# Patient Record
Sex: Female | Born: 1995 | Race: Black or African American | Hispanic: No | Marital: Single | Smoking: Current every day smoker
Health system: Southern US, Community
[De-identification: ages and names within clinical notes are randomized; demographics above are authoritative.]

## PROBLEM LIST (undated history)

## (undated) ENCOUNTER — Inpatient Hospital Stay (HOSPITAL_COMMUNITY): Payer: Self-pay

## (undated) DIAGNOSIS — L03319 Cellulitis of trunk, unspecified: Secondary | ICD-10-CM

## (undated) DIAGNOSIS — F419 Anxiety disorder, unspecified: Secondary | ICD-10-CM

## (undated) DIAGNOSIS — F32A Depression, unspecified: Secondary | ICD-10-CM

## (undated) DIAGNOSIS — D649 Anemia, unspecified: Secondary | ICD-10-CM

## (undated) DIAGNOSIS — L02219 Cutaneous abscess of trunk, unspecified: Secondary | ICD-10-CM

## (undated) DIAGNOSIS — G43909 Migraine, unspecified, not intractable, without status migrainosus: Secondary | ICD-10-CM

## (undated) DIAGNOSIS — B977 Papillomavirus as the cause of diseases classified elsewhere: Secondary | ICD-10-CM

## (undated) DIAGNOSIS — D573 Sickle-cell trait: Secondary | ICD-10-CM

## (undated) HISTORY — DX: Morbid (severe) obesity due to excess calories: E66.01

## (undated) HISTORY — DX: Cutaneous abscess of trunk, unspecified: L03.319

## (undated) HISTORY — DX: Cutaneous abscess of trunk, unspecified: L02.219

---
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
## 2016-02-07 ENCOUNTER — Inpatient Hospital Stay (HOSPITAL_COMMUNITY)
Admission: AD | Admit: 2016-02-07 | Discharge: 2016-02-07 | Disposition: A | Discharge status: Home / Self Care | Payer: Medicaid Other | Source: Ambulatory Visit | Attending: Family Medicine | Admitting: Family Medicine

## 2016-02-07 ENCOUNTER — Encounter (HOSPITAL_COMMUNITY): Payer: Self-pay | Admitting: *Deleted

## 2016-02-07 DIAGNOSIS — Z8279 Family history of other congenital malformations, deformations and chromosomal abnormalities: Secondary | ICD-10-CM | POA: Insufficient documentation

## 2016-02-07 DIAGNOSIS — Z3A34 34 weeks gestation of pregnancy: Secondary | ICD-10-CM | POA: Diagnosis not present

## 2016-02-07 DIAGNOSIS — M545 Low back pain: Secondary | ICD-10-CM | POA: Insufficient documentation

## 2016-02-07 DIAGNOSIS — R109 Unspecified abdominal pain: Secondary | ICD-10-CM | POA: Diagnosis not present

## 2016-02-07 DIAGNOSIS — D573 Sickle-cell trait: Secondary | ICD-10-CM | POA: Insufficient documentation

## 2016-02-07 DIAGNOSIS — Z87891 Personal history of nicotine dependence: Secondary | ICD-10-CM | POA: Diagnosis not present

## 2016-02-07 DIAGNOSIS — O26893 Other specified pregnancy related conditions, third trimester: Secondary | ICD-10-CM | POA: Diagnosis not present

## 2016-02-07 DIAGNOSIS — M549 Dorsalgia, unspecified: Secondary | ICD-10-CM

## 2016-02-07 LAB — URINALYSIS, ROUTINE W REFLEX MICROSCOPIC
Bilirubin Urine: NEGATIVE
GLUCOSE, UA: NEGATIVE mg/dL
Hgb urine dipstick: NEGATIVE
Ketones, ur: 15 mg/dL — AB
NITRITE: NEGATIVE
PH: 6 (ref 5.0–8.0)
PROTEIN: NEGATIVE mg/dL
SPECIFIC GRAVITY, URINE: 1.02 (ref 1.005–1.030)

## 2016-02-07 LAB — URINE MICROSCOPIC-ADD ON

## 2016-02-07 NOTE — Discharge Instructions (Signed)
Back Pain in Pregnancy °Back pain during pregnancy is common. It happens in about half of all pregnancies. It is important for you and your baby that you remain active during your pregnancy. If you feel that back pain is not allowing you to remain active or sleep well, it is time to see your caregiver. Back pain may be caused by several factors related to changes during your pregnancy. Fortunately, unless you had trouble with your back before your pregnancy, the pain is likely to get better after you deliver. °Low back pain usually occurs between the fifth and seventh months of pregnancy. It can, however, happen in the first couple months. Factors that increase the risk of back problems include:  °· Previous back problems. °· Injury to your back. °· Having twins or multiple births. °· A chronic cough. °· Stress. °· Job-related repetitive motions. °· Muscle or spinal disease in the back. °· Family history of back problems, ruptured (herniated) discs, or osteoporosis. °· Depression, anxiety, and panic attacks. °CAUSES  °· When you are pregnant, your body produces a hormone called relaxin. This hormone makes the ligaments connecting the low back and pubic bones more flexible. This flexibility allows the baby to be delivered more easily. When your ligaments are loose, your muscles need to work harder to support your back. Soreness in your back can come from tired muscles. Soreness can also come from back tissues that are irritated since they are receiving less support. °· As the baby grows, it puts pressure on the nerves and blood vessels in your pelvis. This can cause back pain. °· As the baby grows and gets heavier during pregnancy, the uterus pushes the stomach muscles forward and changes your center of gravity. This makes your back muscles work harder to maintain good posture. °SYMPTOMS  °Lumbar pain during pregnancy °Lumbar pain during pregnancy usually occurs at or above the waist in the center of the back. There  may be pain and numbness that radiates into your leg or foot. This is similar to low back pain experienced by non-pregnant women. It usually increases with sitting for long periods of time, standing, or repetitive lifting. Tenderness may also be present in the muscles along your upper back. °Posterior pelvic pain during pregnancy °Pain in the back of the pelvis is more common than lumbar pain in pregnancy. It is a deep pain felt in your side at the waistline, or across the tailbone (sacrum), or in both places. You may have pain on one or both sides. This pain can also go into the buttocks and backs of the upper thighs. Pubic and groin pain may also be present. The pain does not quickly resolve with rest, and morning stiffness may also be present. °Pelvic pain during pregnancy can be brought on by most activities. A high level of fitness before and during pregnancy may or may not prevent this problem. Labor pain is usually 1 to 2 minutes apart, lasts for about 1 minute, and involves a bearing down feeling or pressure in your pelvis. However, if you are at term with the pregnancy, constant low back pain can be the beginning of early labor, and you should be aware of this. °DIAGNOSIS  °X-rays of the back should not be done during the first 12 to 14 weeks of the pregnancy and only when absolutely necessary during the rest of the pregnancy. MRIs do not give off radiation and are safe during pregnancy. MRIs also should only be done when absolutely necessary. °HOME CARE INSTRUCTIONS °· Exercise   as directed by your caregiver. Exercise is the most effective way to prevent or manage back pain. If you have a back problem, it is especially important to avoid sports that require sudden body movements. Swimming and walking are great activities. °· Do not stand in one place for long periods of time. °· Do not wear high heels. °· Sit in chairs with good posture. Use a pillow on your lower back if necessary. Make sure your head  rests over your shoulders and is not hanging forward. °· Try sleeping on your side, preferably the left side, with a pillow or two between your legs. If you are sore after a night's rest, your bed may be too soft. Try placing a board between your mattress and box spring. °· Listen to your body when lifting. If you are experiencing pain, ask for help or try bending your knees more so you can use your leg muscles rather than your back muscles. Squat down when picking up something from the floor. Do not bend over. °· Eat a healthy diet. Try to gain weight within your caregiver's recommendations. °· Use heat or cold packs 3 to 4 times a day for 15 minutes to help with the pain. °· Only take over-the-counter or prescription medicines for pain, discomfort, or fever as directed by your caregiver. °Sudden (acute) back pain °· Use bed rest for only the most extreme, acute episodes of back pain. Prolonged bed rest over 48 hours will aggravate your condition. °· Ice is very effective for acute conditions. °¨ Put ice in a plastic bag. °¨ Place a towel between your skin and the bag. °¨ Leave the ice on for 10 to 20 minutes every 2 hours, or as needed. °· Using heat packs for 30 minutes prior to activities is also helpful. °Continued back pain °See your caregiver if you have continued problems. Your caregiver can help or refer you for appropriate physical therapy. With conditioning, most back problems can be avoided. Sometimes, a more serious issue may be the cause of back pain. You should be seen right away if new problems seem to be developing. Your caregiver may recommend: °· A maternity girdle. °· An elastic sling. °· A back brace. °· A massage therapist or acupuncture. °SEEK MEDICAL CARE IF:  °· You are not able to do most of your daily activities, even when taking the pain medicine you were given. °· You need a referral to a physical therapist or chiropractor. °· You want to try acupuncture. °SEEK IMMEDIATE MEDICAL CARE  IF: °· You develop numbness, tingling, weakness, or problems with the use of your arms or legs. °· You develop severe back pain that is no longer relieved with medicines. °· You have a sudden change in bowel or bladder control. °· You have increasing pain in other areas of the body. °· You develop shortness of breath, dizziness, or fainting. °· You develop nausea, vomiting, or sweating. °· You have back pain which is similar to labor pains. °· You have back pain along with your water breaking or vaginal bleeding. °· You have back pain or numbness that travels down your leg. °· Your back pain developed after you fell. °· You develop pain on one side of your back. You may have a kidney stone. °· You see blood in your urine. You may have a bladder infection or kidney stone. °· You have back pain with blisters. You may have shingles. °Back pain is fairly common during pregnancy but should not be accepted as just part of   the process. Back pain should always be treated as soon as possible. This will make your pregnancy as pleasant as possible.   This information is not intended to replace advice given to you by your health care provider. Make sure you discuss any questions you have with your health care provider.   Document Released: 12/13/2005 Document Revised: 11/27/2011 Document Reviewed: 01/24/2011 Elsevier Interactive Patient Education 2016 [employer]   AM I IN LABOR? What is labor? Labor is the work that your body does to birth your baby. Your uterus (the womb) contracts. Your cervix (the mouth of the uterus) opens. You will push your baby out into the world.  What do contractions (labor pains) feel like? When they first start, contractions usually feel like cramps during your period. Sometimes you feel pain in your back. Most often, contractions feel like muscles pulling painfully in your lower belly. At first, the contractions will probably be 15 to 20 minutes apart. They will not feel too  painful. As labor goes on, the contractions get stronger, closer together, and more painful.  How do I time the contractions? Time your contractions by counting the number of minutes from the start of one contraction to the start of the next contraction.  What should I do when the contractions start? If it is night and you can sleep, sleep. If it happens during the day, here are some things you can do to take care of yourself at home: ? Walk. If the pains you are having are real labor, walking will make the contractions come faster and harder. If the contractions are not going to continue and be real labor, walking will make the contractions slow down. ? Take a shower or bath. This will help you relax. ? Eat. Labor is a big event. It takes a lot of energy. ? Drink water. Not drinking enough water can cause false labor (contractions that hurt but do not open your cervix). If this is true labor, drinking water will help you have strength to get through your labor. ? Take a nap. Get all the rest you can. ? Get a massage. If your labor is in your back, a strong massage on your lower back may feel very good. Getting a foot massage is always good. ? Dont panic. You can do this. Your body was made for this. You are strong!  When should I go to the hospital or call my health care provider? ? Your contractions have been 5 minutes apart or less for at least 1 hour. ? If several contractions are so painful you cannot walk or talk during one. ? Your bag of waters breaks. (You may have a big gush of water or just water that runs down your legs when you walk.)  Are there other reasons to call my health care provider? Yes, you should call your health care provider or go to the hospital if you start to bleed like you are having a period-- blood that soaks your underwear or runs down your legs, if you have sudden severe pain, if your baby has not moved for several hours, or if you are leaking green fluid. The  rule is as follows: If you are very concerned about something, call.

## 2016-02-07 NOTE — MAU Note (Signed)
Patient presents at 3434 weeks gestation with c/o abdominal pain X 2 weeks. Fetus active. Denies bleeding or discharge.
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
## 2016-02-20 ENCOUNTER — Inpatient Hospital Stay (HOSPITAL_COMMUNITY)
Admission: AD | Admit: 2016-02-20 | Discharge: 2016-02-20 | Disposition: A | Discharge status: Home / Self Care | Payer: Medicaid Other | Source: Ambulatory Visit | Attending: Obstetrics & Gynecology | Admitting: Obstetrics & Gynecology

## 2016-02-20 ENCOUNTER — Encounter (HOSPITAL_COMMUNITY): Payer: Self-pay

## 2016-02-20 DIAGNOSIS — Z3493 Encounter for supervision of normal pregnancy, unspecified, third trimester: Secondary | ICD-10-CM | POA: Diagnosis present

## 2016-02-20 LAB — URINE MICROSCOPIC-ADD ON

## 2016-02-20 LAB — URINALYSIS, ROUTINE W REFLEX MICROSCOPIC
Bilirubin Urine: NEGATIVE
Glucose, UA: NEGATIVE mg/dL
HGB URINE DIPSTICK: NEGATIVE
Ketones, ur: NEGATIVE mg/dL
NITRITE: NEGATIVE
PROTEIN: 30 mg/dL — AB
SPECIFIC GRAVITY, URINE: 1.02 (ref 1.005–1.030)
pH: 6.5 (ref 5.0–8.0)

## 2016-02-20 NOTE — MAU Note (Signed)
Notified [facility] [place]. Lawson CNM patient presents with stomach and hip pain, uterine irritability noted, no contractions, fhr reactive, cervix closed, UA results given, discharge order received.

## 2016-02-20 NOTE — Discharge Instructions (Signed)
Vaginal Delivery °During delivery, your health care provider will help you give birth to your baby. During a vaginal delivery, you will work to push the baby out of your vagina. However, before you can push your baby out, a few things need to happen. The opening of your uterus (cervix) has to soften, thin out, and open up (dilate) all the way to 10 cm. Also, your baby has to move down from the uterus into your vagina.  °SIGNS OF LABOR  °Your health care provider will first need to make sure you are in labor. Signs of labor include:  °· Passing what is called the mucous plug before labor begins. This is a small amount of blood-stained mucus. °· Having regular, painful uterine contractions.   °· The time between contractions gets shorter.   °· The discomfort and pain gradually get more intense. °· Contraction pains get worse when walking and do not go away when resting.   °· Your cervix becomes thinner (effacement) and dilates. °BEFORE THE DELIVERY °Once you are in labor and admitted into the hospital or care center, your health care provider may do the following:  °· Perform a complete physical exam. °· Review any complications related to pregnancy or labor.  °· Check your blood pressure, pulse, temperature, and heart rate (vital signs).   °· Determine if, and when, the rupture of amniotic membranes occurred. °· Do a vaginal exam (using a sterile glove and lubricant) to determine:   °¨ The position (presentation) of the baby. Is the baby's head presenting first (vertex) in the birth canal (vagina), or are the feet or buttocks first (breech)?   °¨ The level (station) of the baby's head within the birth canal.   °¨ The effacement and dilatation of the cervix.   °· An electronic fetal monitor is usually placed on your abdomen when you first arrive. This is used to monitor your contractions and the baby's heart rate. °¨ When the monitor is on your abdomen (external fetal monitor), it can only pick up the frequency and  length of your contractions. It cannot tell the strength of your contractions. °¨ If it becomes necessary for your health care provider to know exactly how strong your contractions are or to see exactly what the baby's heart rate is doing, an internal monitor may be inserted into your vagina and uterus. Your health care provider will discuss the benefits and risks of using an internal monitor and obtain your permission before inserting the device. °¨ Continuous fetal monitoring may be needed if you have an epidural, are receiving certain medicines (such as oxytocin), or have pregnancy or labor complications. °· An IV access tube may be placed into a vein in your arm to deliver fluids and medicines if necessary. °THREE STAGES OF LABOR AND DELIVERY °Normal labor and delivery is divided into three stages. °First Stage °This stage starts when you begin to contract regularly and your cervix begins to efface and dilate. It ends when your cervix is completely open (fully dilated). The first stage is the longest stage of labor and can last from 3 hours to 15 hours.  °Several methods are available to help with labor pain. You and your health care provider will decide which option is best for you. Options include:  °· Opioid medicines. These are strong pain medicines that you can get through your IV tube or as a shot into your muscle. These medicines lessen pain but do not make it go away completely.  °· Epidural. A medicine is given through a thin tube that   is inserted in your back. The medicine numbs the lower part of your body and prevents any pain in that area. °· Paracervical pain medicine. This is an injection of an anesthetic on each side of your cervix.   °· You may request natural childbirth, which does not involve the use of pain medicines or an epidural during labor and delivery. Instead, you will use other things, such as breathing exercises, to help cope with the pain. °Second Stage °The second stage of labor  begins when your cervix is fully dilated at 10 cm. It continues until you push your baby down through the birth canal and the baby is born. This stage can take only minutes or several hours. °· The location of your baby's head as it moves through the birth canal is reported as a number called a station. If the baby's head has not started its descent, the station is described as being at minus 3 (-3). When your baby's head is at the zero station, it is at the middle of the birth canal and is engaged in the pelvis. The station of your baby helps indicate the progress of the second stage of labor. °· When your baby is born, your health care provider may hold the baby with his or her head lowered to prevent amniotic fluid, mucus, and blood from getting into the baby's lungs. The baby's mouth and nose may be suctioned with a small bulb syringe to remove any additional fluid. °· Your health care provider may then place the baby on your stomach. It is important to keep the baby from getting cold. To do this, the health care provider will dry the baby off, place the baby directly on your skin (with no blankets between you and the baby), and cover the baby with warm, dry blankets.   °· The umbilical cord is cut. °Third Stage °During the third stage of labor, your health care provider will deliver the placenta (afterbirth) and make sure your bleeding is under control. The delivery of the placenta usually takes about 5 minutes but can take up to 30 minutes. After the placenta is delivered, a medicine may be given either by IV or injection to help contract the uterus and control bleeding. If you are planning to breastfeed, you can try to do so now. °After you deliver the placenta, your uterus should contract and get very firm. If your uterus does not remain firm, your health care provider will massage it. This is important because the contraction of the uterus helps cut off bleeding at the site where the placenta was attached  to your uterus. If your uterus does not contract properly and stay firm, you may continue to bleed heavily. If there is a lot of bleeding, medicines may be given to contract the uterus and stop the bleeding.  °  °This information is not intended to replace advice given to you by your health care provider. Make sure you discuss any questions you have with your health care provider. °  °Document Released: 06/13/2008 Document Revised: 09/25/2014 Document Reviewed: 05/01/2012 °[employer] Interactive Patient Education ©2016 [employer] ° °

## 2016-02-20 NOTE — MAU Note (Signed)
Patient here for abdominal pain and hip pain, no vaginal bleeding, positive fm

## 2016-02-28 ENCOUNTER — Encounter (HOSPITAL_COMMUNITY): Payer: Self-pay | Admitting: *Deleted

## 2016-02-28 ENCOUNTER — Inpatient Hospital Stay (HOSPITAL_COMMUNITY)
Admission: AD | Admit: 2016-02-28 | Discharge: 2016-02-28 | Disposition: A | Discharge status: Home / Self Care | Payer: Medicaid Other | Source: Ambulatory Visit | Attending: Obstetrics and Gynecology | Admitting: Obstetrics and Gynecology

## 2016-02-28 DIAGNOSIS — O26893 Other specified pregnancy related conditions, third trimester: Secondary | ICD-10-CM | POA: Insufficient documentation

## 2016-02-28 DIAGNOSIS — D573 Sickle-cell trait: Secondary | ICD-10-CM | POA: Diagnosis not present

## 2016-02-28 DIAGNOSIS — Z87891 Personal history of nicotine dependence: Secondary | ICD-10-CM | POA: Diagnosis not present

## 2016-02-28 DIAGNOSIS — O9989 Other specified diseases and conditions complicating pregnancy, childbirth and the puerperium: Secondary | ICD-10-CM

## 2016-02-28 DIAGNOSIS — Z3A37 37 weeks gestation of pregnancy: Secondary | ICD-10-CM | POA: Insufficient documentation

## 2016-02-28 DIAGNOSIS — R109 Unspecified abdominal pain: Secondary | ICD-10-CM | POA: Diagnosis not present

## 2016-02-28 DIAGNOSIS — N898 Other specified noninflammatory disorders of vagina: Secondary | ICD-10-CM | POA: Diagnosis present

## 2016-02-28 LAB — WET PREP, GENITAL
Clue Cells Wet Prep HPF POC: NONE SEEN
Sperm: NONE SEEN
Trich, Wet Prep: NONE SEEN
Yeast Wet Prep HPF POC: NONE SEEN

## 2016-02-28 LAB — URINALYSIS, ROUTINE W REFLEX MICROSCOPIC
Bilirubin Urine: NEGATIVE
GLUCOSE, UA: NEGATIVE mg/dL
HGB URINE DIPSTICK: NEGATIVE
Ketones, ur: NEGATIVE mg/dL
Leukocytes, UA: NEGATIVE
Nitrite: NEGATIVE
PH: 6.5 (ref 5.0–8.0)
PROTEIN: NEGATIVE mg/dL
SPECIFIC GRAVITY, URINE: 1.02 (ref 1.005–1.030)

## 2016-02-28 LAB — POCT FERN TEST: POCT Fern Test: NEGATIVE

## 2016-02-28 NOTE — Progress Notes (Signed)
Notified of all lab tests results and negative fern. Will discharge home

## 2016-02-28 NOTE — Discharge Instructions (Signed)
Fetal Movement Counts °Patient Name: __________________________________________________ Patient Due Date: ____________________ °Performing a fetal movement count is highly recommended in high-risk pregnancies, but it is good for every pregnant woman to do. Your health care provider may ask you to start counting fetal movements at 28 weeks of the pregnancy. Fetal movements often increase: °· After eating a full meal. °· After physical activity. °· After eating or drinking something sweet or cold. °· At rest. °Pay attention to when you feel the baby is most active. This will help you notice a pattern of your baby's sleep and wake cycles and what factors contribute to an increase in fetal movement. It is important to perform a fetal movement count at the same time each day when your baby is normally most active.  °HOW TO COUNT FETAL MOVEMENTS °1. Find a quiet and comfortable area to sit or lie down on your left side. Lying on your left side provides the best blood and oxygen circulation to your baby. °2. Write down the day and time on a sheet of paper or in a journal. °3. Start counting kicks, flutters, swishes, rolls, or jabs in a 2-hour period. You should feel at least 10 movements within 2 hours. °4. If you do not feel 10 movements in 2 hours, wait 2-3 hours and count again. Look for a change in the pattern or not enough counts in 2 hours. °SEEK MEDICAL CARE IF: °· You feel less than 10 counts in 2 hours, tried twice. °· There is no movement in over an hour. °· The pattern is changing or taking longer each day to reach 10 counts in 2 hours. °· You feel the baby is not moving as he or she usually does. °Date: ____________ Movements: ____________ Start time: ____________ Finish time: ____________  °Date: ____________ Movements: ____________ Start time: ____________ Finish time: ____________ °Date: ____________ Movements: ____________ Start time: ____________ Finish time: ____________ °Date: ____________ Movements:  ____________ Start time: ____________ Finish time: ____________ °Date: ____________ Movements: ____________ Start time: ____________ Finish time: ____________ °Date: ____________ Movements: ____________ Start time: ____________ Finish time: ____________ °Date: ____________ Movements: ____________ Start time: ____________ Finish time: ____________ °Date: ____________ Movements: ____________ Start time: ____________ Finish time: ____________  °Date: ____________ Movements: ____________ Start time: ____________ Finish time: ____________ °Date: ____________ Movements: ____________ Start time: ____________ Finish time: ____________ °Date: ____________ Movements: ____________ Start time: ____________ Finish time: ____________ °Date: ____________ Movements: ____________ Start time: ____________ Finish time: ____________ °Date: ____________ Movements: ____________ Start time: ____________ Finish time: ____________ °Date: ____________ Movements: ____________ Start time: ____________ Finish time: ____________ °Date: ____________ Movements: ____________ Start time: ____________ Finish time: ____________  °Date: ____________ Movements: ____________ Start time: ____________ Finish time: ____________ °Date: ____________ Movements: ____________ Start time: ____________ Finish time: ____________ °Date: ____________ Movements: ____________ Start time: ____________ Finish time: ____________ °Date: ____________ Movements: ____________ Start time: ____________ Finish time: ____________ °Date: ____________ Movements: ____________ Start time: ____________ Finish time: ____________ °Date: ____________ Movements: ____________ Start time: ____________ Finish time: ____________ °Date: ____________ Movements: ____________ Start time: ____________ Finish time: ____________  °Date: ____________ Movements: ____________ Start time: ____________ Finish time: ____________ °Date: ____________ Movements: ____________ Start time: ____________ Finish  time: ____________ °Date: ____________ Movements: ____________ Start time: ____________ Finish time: ____________ °Date: ____________ Movements: ____________ Start time: ____________ Finish time: ____________ °Date: ____________ Movements: ____________ Start time: ____________ Finish time: ____________ °Date: ____________ Movements: ____________ Start time: ____________ Finish time: ____________ °Date: ____________ Movements: ____________ Start time: ____________ Finish time: ____________  °Date: ____________ Movements: ____________ Start time: ____________ Finish   time: ____________ °Date: ____________ Movements: ____________ Start time: ____________ Finish time: ____________ °Date: ____________ Movements: ____________ Start time: ____________ Finish time: ____________ °Date: ____________ Movements: ____________ Start time: ____________ Finish time: ____________ °Date: ____________ Movements: ____________ Start time: ____________ Finish time: ____________ °Date: ____________ Movements: ____________ Start time: ____________ Finish time: ____________ °Date: ____________ Movements: ____________ Start time: ____________ Finish time: ____________  °Date: ____________ Movements: ____________ Start time: ____________ Finish time: ____________ °Date: ____________ Movements: ____________ Start time: ____________ Finish time: ____________ °Date: ____________ Movements: ____________ Start time: ____________ Finish time: ____________ °Date: ____________ Movements: ____________ Start time: ____________ Finish time: ____________ °Date: ____________ Movements: ____________ Start time: ____________ Finish time: ____________ °Date: ____________ Movements: ____________ Start time: ____________ Finish time: ____________ °Date: ____________ Movements: ____________ Start time: ____________ Finish time: ____________  °Date: ____________ Movements: ____________ Start time: ____________ Finish time: ____________ °Date: ____________  Movements: ____________ Start time: ____________ Finish time: ____________ °Date: ____________ Movements: ____________ Start time: ____________ Finish time: ____________ °Date: ____________ Movements: ____________ Start time: ____________ Finish time: ____________ °Date: ____________ Movements: ____________ Start time: ____________ Finish time: ____________ °Date: ____________ Movements: ____________ Start time: ____________ Finish time: ____________ °Date: ____________ Movements: ____________ Start time: ____________ Finish time: ____________  °Date: ____________ Movements: ____________ Start time: ____________ Finish time: ____________ °Date: ____________ Movements: ____________ Start time: ____________ Finish time: ____________ °Date: ____________ Movements: ____________ Start time: ____________ Finish time: ____________ °Date: ____________ Movements: ____________ Start time: ____________ Finish time: ____________ °Date: ____________ Movements: ____________ Start time: ____________ Finish time: ____________ °Date: ____________ Movements: ____________ Start time: ____________ Finish time: ____________ °  °This information is not intended to replace advice given to you by your health care provider. Make sure you discuss any questions you have with your health care provider. °  °Document Released: 10/04/2006 Document Revised: 09/25/2014 Document Reviewed: 07/01/2012 °Elsevier Interactive Patient Education ©2016 [employer] °Third Trimester of Pregnancy °The third trimester is from week 29 through week 42, months 7 through 9. The third trimester is a time when the fetus is growing rapidly. At the end of the ninth month, the fetus is about 20 inches in length and weighs 6-10 pounds.  °BODY CHANGES °Your body goes through many changes during pregnancy. The changes vary from woman to woman.  °· Your weight will continue to increase. You can expect to gain 25-35 pounds (11-16 kg) by the end of the pregnancy. °· You may  begin to get stretch marks on your hips, abdomen, and breasts. °· You may urinate more often because the fetus is moving lower into your pelvis and pressing on your bladder. °· You may develop or continue to have heartburn as a result of your pregnancy. °· You may develop constipation because certain hormones are causing the muscles that push waste through your intestines to slow down. °· You may develop hemorrhoids or swollen, bulging veins (varicose veins). °· You may have pelvic pain because of the weight gain and pregnancy hormones relaxing your joints between the bones in your pelvis. Backaches may result from overexertion of the muscles supporting your posture. °· You may have changes in your hair. These can include thickening of your hair, rapid growth, and changes in texture. Some women also have hair loss during or after pregnancy, or hair that feels dry or thin. Your hair will most likely return to normal after your baby is born. °· Your breasts will continue to grow and be tender. A yellow discharge may leak from your breasts called colostrum. °· Your belly button may stick out. °·   You may feel short of breath because of your expanding uterus. °· You may notice the fetus "dropping," or moving lower in your abdomen. °· You may have a bloody mucus discharge. This usually occurs a few days to a week before labor begins. °· Your cervix becomes thin and soft (effaced) near your due date. °WHAT TO EXPECT AT YOUR PRENATAL EXAMS  °You will have prenatal exams every 2 weeks until week 36. Then, you will have weekly prenatal exams. During a routine prenatal visit: °5. You will be weighed to make sure you and the fetus are growing normally. °6. Your blood pressure is taken. °7. Your abdomen will be measured to track your baby's growth. °8. The fetal heartbeat will be listened to. °9. Any test results from the previous visit will be discussed. °10. You may have a cervical check near your due date to see if you have  effaced. °At around 36 weeks, your caregiver will check your cervix. At the same time, your caregiver will also perform a test on the secretions of the vaginal tissue. This test is to determine if a type of bacteria, Group B streptococcus, is present. Your caregiver will explain this further. °Your caregiver may ask you: °· What your birth plan is. °· How you are feeling. °· If you are feeling the baby move. °· If you have had any abnormal symptoms, such as leaking fluid, bleeding, severe headaches, or abdominal cramping. °· If you are using any tobacco products, including cigarettes, chewing tobacco, and electronic cigarettes. °· If you have any questions. °Other tests or screenings that may be performed during your third trimester include: °· Blood tests that check for low iron levels (anemia). °· Fetal testing to check the health, activity level, and growth of the fetus. Testing is done if you have certain medical conditions or if there are problems during the pregnancy. °· HIV (human immunodeficiency virus) testing. If you are at high risk, you may be screened for HIV during your third trimester of pregnancy. °FALSE LABOR °You may feel small, irregular contractions that eventually go away. These are called Braxton Hicks contractions, or false labor. Contractions may last for hours, days, or even weeks before true labor sets in. If contractions come at regular intervals, intensify, or become painful, it is best to be seen by your caregiver.  °SIGNS OF LABOR  °· Menstrual-like cramps. °· Contractions that are 5 minutes apart or less. °· Contractions that start on the top of the uterus and spread down to the lower abdomen and back. °· A sense of increased pelvic pressure or back pain. °· A watery or bloody mucus discharge that comes from the vagina. °If you have any of these signs before the 37th week of pregnancy, call your caregiver right away. You need to go to the hospital to get checked immediately. °HOME CARE  INSTRUCTIONS  °· Avoid all smoking, herbs, alcohol, and unprescribed drugs. These chemicals affect the formation and growth of the baby. °· Do not use any tobacco products, including cigarettes, chewing tobacco, and electronic cigarettes. If you need help quitting, ask your health care provider. You may receive counseling support and other resources to help you quit. °· Follow your caregiver's instructions regarding medicine use. There are medicines that are either safe or unsafe to take during pregnancy. °· Exercise only as directed by your caregiver. Experiencing uterine cramps is a good sign to stop exercising. °· Continue to eat regular, healthy meals. °· Wear a good support bra for   breast tenderness.  Do not use hot tubs, steam rooms, or saunas.  Wear your seat belt at all times when driving.  Avoid raw meat, uncooked cheese, cat litter boxes, and soil used by cats. These carry germs that can cause birth defects in the baby.  Take your prenatal vitamins.  Take 1500-2000 mg of calcium daily starting at the 20th week of pregnancy until you deliver your baby.  Try taking a stool softener (if your caregiver approves) if you develop constipation. Eat more high-fiber foods, such as fresh vegetables or fruit and whole grains. Drink plenty of fluids to keep your urine clear or pale yellow.  Take warm sitz baths to soothe any pain or discomfort caused by hemorrhoids. Use hemorrhoid cream if your caregiver approves.  If you develop varicose veins, wear support hose. Elevate your feet for 15 minutes, 3-4 times a day. Limit salt in your diet.  Avoid heavy lifting, wear low heal shoes, and practice good posture.  Rest a lot with your legs elevated if you have leg cramps or low back pain.  Visit your dentist if you have not gone during your pregnancy. Use a soft toothbrush to brush your teeth and be gentle when you floss.  A sexual relationship may be continued unless your caregiver directs you  otherwise.  Do not travel far distances unless it is absolutely necessary and only with the approval of your caregiver.  Take prenatal classes to understand, practice, and ask questions about the labor and delivery.  Make a trial run to the hospital.  Pack your hospital bag.  Prepare the baby's nursery.  Continue to go to all your prenatal visits as directed by your caregiver. SEEK MEDICAL CARE IF:  You are unsure if you are in labor or if your water has broken.  You have dizziness.  You have mild pelvic cramps, pelvic pressure, or nagging pain in your abdominal area.  You have persistent nausea, vomiting, or diarrhea.  You have a bad smelling vaginal discharge.  You have pain with urination. SEEK IMMEDIATE MEDICAL CARE IF:   You have a fever.  You are leaking fluid from your vagina.  You have spotting or bleeding from your vagina.  You have severe abdominal cramping or pain.  You have rapid weight loss or gain.  You have shortness of breath with chest pain.  You notice sudden or extreme swelling of your face, hands, ankles, feet, or legs.  You have not felt your baby move in over an hour.  You have severe headaches that do not go away with medicine.  You have vision changes.   This information is not intended to replace advice given to you by your health care provider. Make sure you discuss any questions you have with your health care provider.   Document Released: 08/29/2001 Document Revised: 09/25/2014 Document Reviewed: 11/05/2012 Elsevier Interactive Patient Education 2016 [employer] Inc[employer] Ball CorporationBraxton Hicks Contractions Contractions of the uterus can occur throughout pregnancy. Contractions are not always a sign that you are in labor.  WHAT ARE BRAXTON HICKS CONTRACTIONS?  Contractions that occur before labor are called Braxton Hicks contractions, or false labor. Toward the end of pregnancy (32-34 weeks), these contractions can develop more often and may become  more forceful. This is not true labor because these contractions do not result in opening (dilatation) and thinning of the cervix. They are sometimes difficult to tell apart from true labor because these contractions can be forceful and people have different pain tolerances. You should  not feel embarrassed if you go to the hospital with false labor. Sometimes, the only way to tell if you are in true labor is for your health care provider to look for changes in the cervix. If there are no prenatal problems or other health problems associated with the pregnancy, it is completely safe to be sent home with false labor and await the onset of true labor. HOW CAN YOU TELL THE DIFFERENCE BETWEEN TRUE AND FALSE LABOR? False Labor  The contractions of false labor are usually shorter and not as hard as those of true labor.   The contractions are usually irregular.   The contractions are often felt in the front of the lower abdomen and in the groin.   The contractions may go away when you walk around or change positions while lying down.   The contractions get weaker and are shorter lasting as time goes on.   The contractions do not usually become progressively stronger, regular, and closer together as with true labor.  True Labor 11. Contractions in true labor last 30-70 seconds, become very regular, usually become more intense, and increase in frequency.  12. The contractions do not go away with walking.  13. The discomfort is usually felt in the top of the uterus and spreads to the lower abdomen and low back.  14. True labor can be determined by your health care provider with an exam. This will show that the cervix is dilating and getting thinner.  WHAT TO REMEMBER  Keep up with your usual exercises and follow other instructions given by your health care provider.   Take medicines as directed by your health care provider.   Keep your regular prenatal appointments.   Eat and drink  lightly if you think you are going into labor.   If Braxton Hicks contractions are making you uncomfortable:   Change your position from lying down or resting to walking, or from walking to resting.   Sit and rest in a tub of warm water.   Drink 2-3 glasses of water. Dehydration may cause these contractions.   Do slow and deep breathing several times an hour.  WHEN SHOULD I SEEK IMMEDIATE MEDICAL CARE? Seek immediate medical care if:  Your contractions become stronger, more regular, and closer together.   You have fluid leaking or gushing from your vagina.   You have a fever.   You pass blood-tinged mucus.   You have vaginal bleeding.   You have continuous abdominal pain.   You have low back pain that you never had before.   You feel your baby's head pushing down and causing pelvic pressure.   Your baby is not moving as much as it used to.    This information is not intended to replace advice given to you by your health care provider. Make sure you discuss any questions you have with your health care provider.   Document Released: 09/04/2005 Document Revised: 09/09/2013 Document Reviewed: 06/16/2013 Elsevier Interactive Patient Education [employer]

## 2016-02-28 NOTE — MAU Note (Signed)
Pt presents complaining of pink discharge when she wipes and pain in her pelvis and vagina. Denies leaking of fluid. Reports good fetal movement.

## 2016-02-28 NOTE — MAU Provider Note (Signed)
Kelly HISTORY AND PHYSICAL  Chief Complaint:  Vaginal Discharge and Pelvic Pain   Kelly Hensley is a 20 y.o.  G1P0000 with IUP at [redacted]w[redacted]d presenting for Vaginal Discharge and Pelvic Pain Occasional contraction. Pink-tinged discharge today. No fever or dysuria. Positive fetal movement. Called by nurse to rule-out rupture. RN labor check.     Past Medical History  Diagnosis Date  . Morbid obesity (HCC)   . Cellulitis and abscess of trunk   . Sickle cell trait (HCC)   . Anemia   . HPV (human papilloma virus) infection   . Migraines     Past Surgical History  Procedure Laterality Date  . Abcess drainage Left 2014    Family History  Problem Relation Age of Onset  . Diabetes Mother   . Sickle cell anemia Father     Social History  Substance Use Topics  . Smoking status: Former Smoker -- 0.25 packs/day    Types: Cigarettes  . Smokeless tobacco: None  . Alcohol Use: No    No Known Allergies  Prescriptions prior to admission  Medication Sig Dispense Refill Last Dose  . ferrous sulfate 325 (65 FE) MG tablet Take 325 mg by mouth at bedtime.   02/27/2016 at Unknown time  . Prenatal Vit-Fe Fumarate-FA (PRENATAL MULTIVITAMIN) TABS tablet Take 1 tablet by mouth at bedtime.   02/27/2016 at Unknown time    Review of Systems - Negative except for what is mentioned in HPI.  Physical Exam  Blood pressure 133/78, pulse 101, temperature 98.3 F (36.8 C), temperature source Oral, resp. rate 18, last menstrual period 06/10/2015. GENERAL: Well-developed, well-nourished female in no acute distress.  LUNGS: Clear to auscultation bilaterally.  HEART: Regular rate and rhythm. ABDOMEN: Soft, nontender, nondistended, gravid.  EXTREMITIES: Nontender, no edema, 2+ distal pulses. Cervical Exam: closed/thick/high. SSE reveals no pooling, scant mucous discharge, no blood FHT: 140/mod/+a/-d Toco: irregular irritability   Labs: Results for orders placed or performed during the hospital encounter of  02/28/16 (from the past 24 hour(s))  Urinalysis, Routine w reflex microscopic (not at [facility])   Collection Time: 02/28/16  9:05 PM  Result Value Ref Range   Color, Urine YELLOW YELLOW   APPearance CLEAR CLEAR   Specific Gravity, Urine 1.020 1.005 - 1.030   pH 6.5 5.0 - 8.0   Glucose, UA NEGATIVE NEGATIVE mg/dL   Hgb urine dipstick NEGATIVE NEGATIVE   Bilirubin Urine NEGATIVE NEGATIVE   Ketones, ur NEGATIVE NEGATIVE mg/dL   Protein, ur NEGATIVE NEGATIVE mg/dL   Nitrite NEGATIVE NEGATIVE   Leukocytes, UA NEGATIVE NEGATIVE  Wet prep, genital   Collection Time: 02/28/16  9:13 PM  Result Value Ref Range   Yeast Wet Prep HPF POC NONE SEEN NONE SEEN   Trich, Wet Prep NONE SEEN NONE SEEN   Clue Cells Wet Prep HPF POC NONE SEEN NONE SEEN   WBC, Wet Prep HPF POC MODERATE (A) NONE SEEN   Sperm NONE SEEN   Fern Test   Collection Time: 02/28/16  9:43 PM  Result Value Ref Range   POCT Fern Test Negative = intact amniotic membranes     Imaging Studies:  No results found.  Assessment: Kelly Hensley is  20 y.o. G1P0000 at [redacted]w[redacted]d presents for labor check. Rn asked me to rule-out rupture. Not in labor, cervix closed, nst reactive. No bleeding seen on exam. Wet prep unremarkable, fern negative, pooling negative, urinalysis not suggestive of infection./  Plan: - f/u G/C - prom and labor return precautions -  clinic f/u this week as scheduled  Silvano Bilisoah B Naseer Hearn 6/12/20179:47 PM

## 2016-02-29 LAB — GC/CHLAMYDIA PROBE AMP (~~LOC~~) NOT AT ARMC
CHLAMYDIA, DNA PROBE: NEGATIVE
NEISSERIA GONORRHEA: NEGATIVE

## 2016-03-07 ENCOUNTER — Encounter (HOSPITAL_COMMUNITY): Payer: Self-pay

## 2016-03-07 ENCOUNTER — Inpatient Hospital Stay (HOSPITAL_COMMUNITY)
Admission: AD | Admit: 2016-03-07 | Discharge: 2016-03-07 | Disposition: A | Discharge status: Home / Self Care | Payer: Medicaid Other | Source: Ambulatory Visit | Attending: Family Medicine | Admitting: Family Medicine

## 2016-03-07 DIAGNOSIS — Z3493 Encounter for supervision of normal pregnancy, unspecified, third trimester: Secondary | ICD-10-CM | POA: Diagnosis not present

## 2016-03-07 NOTE — Discharge Instructions (Signed)
Braxton Hicks Contractions °Contractions of the uterus can occur throughout pregnancy. Contractions are not always a sign that you are in labor.  °WHAT ARE BRAXTON HICKS CONTRACTIONS?  °Contractions that occur before labor are called Braxton Hicks contractions, or false labor. Toward the end of pregnancy (32-34 weeks), these contractions can develop more often and may become more forceful. This is not true labor because these contractions do not result in opening (dilatation) and thinning of the cervix. They are sometimes difficult to tell apart from true labor because these contractions can be forceful and people have different pain tolerances. You should not feel embarrassed if you go to the hospital with false labor. Sometimes, the only way to tell if you are in true labor is for your health care provider to look for changes in the cervix. °If there are no prenatal problems or other health problems associated with the pregnancy, it is completely safe to be sent home with false labor and await the onset of true labor. °HOW CAN YOU TELL THE DIFFERENCE BETWEEN TRUE AND FALSE LABOR? °False Labor °· The contractions of false labor are usually shorter and not as hard as those of true labor.   °· The contractions are usually irregular.   °· The contractions are often felt in the front of the lower abdomen and in the groin.   °· The contractions may go away when you walk around or change positions while lying down.   °· The contractions get weaker and are shorter lasting as time goes on.   °· The contractions do not usually become progressively stronger, regular, and closer together as with true labor.   °True Labor °· Contractions in true labor last 30-70 seconds, become very regular, usually become more intense, and increase in frequency.   °· The contractions do not go away with walking.   °· The discomfort is usually felt in the top of the uterus and spreads to the lower abdomen and low back.   °· True labor can be  determined by your health care provider with an exam. This will show that the cervix is dilating and getting thinner.   °WHAT TO REMEMBER °· Keep up with your usual exercises and follow other instructions given by your health care provider.   °· Take medicines as directed by your health care provider.   °· Keep your regular prenatal appointments.   °· Eat and drink lightly if you think you are going into labor.   °· If Braxton Hicks contractions are making you uncomfortable:   °¨ Change your position from lying down or resting to walking, or from walking to resting.   °¨ Sit and rest in a tub of warm water.   °¨ Drink 2-3 glasses of water. Dehydration may cause these contractions.   °¨ Do slow and deep breathing several times an hour.   °WHEN SHOULD I SEEK IMMEDIATE MEDICAL CARE? °Seek immediate medical care if: °· Your contractions become stronger, more regular, and closer together.   °· You have fluid leaking or gushing from your vagina.   °· You have a fever.   °· You pass blood-tinged mucus.   °· You have vaginal bleeding.   °· You have continuous abdominal pain.   °· You have low back pain that you never had before.   °· You feel your baby's head pushing down and causing pelvic pressure.   °· Your baby is not moving as much as it used to.   °  °This information is not intended to replace advice given to you by your health care provider. Make sure you discuss any questions you have with your health care   provider. °  °Document Released: 09/04/2005 Document Revised: 09/09/2013 Document Reviewed: 06/16/2013 °[employer] Interactive Patient Education ©2016 [employer] °Fetal Movement Counts °Patient Name: __________________________________________________ Patient Due Date: ____________________ °Performing a fetal movement count is highly recommended in high-risk pregnancies, but it is good for every pregnant woman to do. Your health care provider may ask you to start counting fetal movements at 28 weeks of the  pregnancy. Fetal movements often increase: °· After eating a full meal. °· After physical activity. °· After eating or drinking something sweet or cold. °· At rest. °Pay attention to when you feel the baby is most active. This will help you notice a pattern of your baby's sleep and wake cycles and what factors contribute to an increase in fetal movement. It is important to perform a fetal movement count at the same time each day when your baby is normally most active.  °HOW TO COUNT FETAL MOVEMENTS °· Find a quiet and comfortable area to sit or lie down on your left side. Lying on your left side provides the best blood and oxygen circulation to your baby. °· Write down the day and time on a sheet of paper or in a journal. °· Start counting kicks, flutters, swishes, rolls, or jabs in a 2-hour period. You should feel at least 10 movements within 2 hours. °· If you do not feel 10 movements in 2 hours, wait 2-3 hours and count again. Look for a change in the pattern or not enough counts in 2 hours. °SEEK MEDICAL CARE IF: °· You feel less than 10 counts in 2 hours, tried twice. °· There is no movement in over an hour. °· The pattern is changing or taking longer each day to reach 10 counts in 2 hours. °· You feel the baby is not moving as he or she usually does. °Date: ____________ Movements: ____________ Start time: ____________ Finish time: ____________  °Date: ____________ Movements: ____________ Start time: ____________ Finish time: ____________ °Date: ____________ Movements: ____________ Start time: ____________ Finish time: ____________ °Date: ____________ Movements: ____________ Start time: ____________ Finish time: ____________ °Date: ____________ Movements: ____________ Start time: ____________ Finish time: ____________ °Date: ____________ Movements: ____________ Start time: ____________ Finish time: ____________ °Date: ____________ Movements: ____________ Start time: ____________ Finish time: ____________ °Date:  ____________ Movements: ____________ Start time: ____________ Finish time: ____________  °Date: ____________ Movements: ____________ Start time: ____________ Finish time: ____________ °Date: ____________ Movements: ____________ Start time: ____________ Finish time: ____________ °Date: ____________ Movements: ____________ Start time: ____________ Finish time: ____________ °Date: ____________ Movements: ____________ Start time: ____________ Finish time: ____________ °Date: ____________ Movements: ____________ Start time: ____________ Finish time: ____________ °Date: ____________ Movements: ____________ Start time: ____________ Finish time: ____________ °Date: ____________ Movements: ____________ Start time: ____________ Finish time: ____________  °Date: ____________ Movements: ____________ Start time: ____________ Finish time: ____________ °Date: ____________ Movements: ____________ Start time: ____________ Finish time: ____________ °Date: ____________ Movements: ____________ Start time: ____________ Finish time: ____________ °Date: ____________ Movements: ____________ Start time: ____________ Finish time: ____________ °Date: ____________ Movements: ____________ Start time: ____________ Finish time: ____________ °Date: ____________ Movements: ____________ Start time: ____________ Finish time: ____________ °Date: ____________ Movements: ____________ Start time: ____________ Finish time: ____________  °Date: ____________ Movements: ____________ Start time: ____________ Finish time: ____________ °Date: ____________ Movements: ____________ Start time: ____________ Finish time: ____________ °Date: ____________ Movements: ____________ Start time: ____________ Finish time: ____________ °Date: ____________ Movements: ____________ Start time: ____________ Finish time: ____________ °Date: ____________ Movements: ____________ Start time: ____________ Finish time: ____________ °Date: ____________ Movements: ____________ Start  time: ____________ Finish time:   ____________ °Date: ____________ Movements: ____________ Start time: ____________ Finish time: ____________  °Date: ____________ Movements: ____________ Start time: ____________ Finish time: ____________ °Date: ____________ Movements: ____________ Start time: ____________ Finish time: ____________ °Date: ____________ Movements: ____________ Start time: ____________ Finish time: ____________ °Date: ____________ Movements: ____________ Start time: ____________ Finish time: ____________ °Date: ____________ Movements: ____________ Start time: ____________ Finish time: ____________ °Date: ____________ Movements: ____________ Start time: ____________ Finish time: ____________ °Date: ____________ Movements: ____________ Start time: ____________ Finish time: ____________  °Date: ____________ Movements: ____________ Start time: ____________ Finish time: ____________ °Date: ____________ Movements: ____________ Start time: ____________ Finish time: ____________ °Date: ____________ Movements: ____________ Start time: ____________ Finish time: ____________ °Date: ____________ Movements: ____________ Start time: ____________ Finish time: ____________ °Date: ____________ Movements: ____________ Start time: ____________ Finish time: ____________ °Date: ____________ Movements: ____________ Start time: ____________ Finish time: ____________ °Date: ____________ Movements: ____________ Start time: ____________ Finish time: ____________  °Date: ____________ Movements: ____________ Start time: ____________ Finish time: ____________ °Date: ____________ Movements: ____________ Start time: ____________ Finish time: ____________ °Date: ____________ Movements: ____________ Start time: ____________ Finish time: ____________ °Date: ____________ Movements: ____________ Start time: ____________ Finish time: ____________ °Date: ____________ Movements: ____________ Start time: ____________ Finish time:  ____________ °Date: ____________ Movements: ____________ Start time: ____________ Finish time: ____________ °Date: ____________ Movements: ____________ Start time: ____________ Finish time: ____________  °Date: ____________ Movements: ____________ Start time: ____________ Finish time: ____________ °Date: ____________ Movements: ____________ Start time: ____________ Finish time: ____________ °Date: ____________ Movements: ____________ Start time: ____________ Finish time: ____________ °Date: ____________ Movements: ____________ Start time: ____________ Finish time: ____________ °Date: ____________ Movements: ____________ Start time: ____________ Finish time: ____________ °Date: ____________ Movements: ____________ Start time: ____________ Finish time: ____________ °  °This information is not intended to replace advice given to you by your health care provider. Make sure you discuss any questions you have with your health care provider. °  °Document Released: 10/04/2006 Document Revised: 09/25/2014 Document Reviewed: 07/01/2012 °Elsevier Interactive Patient Education ©2016 [employer] ° °

## 2016-03-07 NOTE — MAU Note (Signed)
Patient is here with c/o ctx. Patient denies any bleeding or LOF.

## 2016-03-07 NOTE — MAU Note (Signed)
Notified provider that patient is here for a labor eval. Patient is 1/20/-2 on cervical exam. No bleeding or LOF. Provider said to discharge patient.

## 2016-03-08 ENCOUNTER — Encounter (HOSPITAL_COMMUNITY): Payer: Self-pay

## 2016-03-08 ENCOUNTER — Inpatient Hospital Stay (HOSPITAL_COMMUNITY)
Admission: AD | Admit: 2016-03-08 | Discharge: 2016-03-08 | Disposition: A | Discharge status: Home / Self Care | Payer: Medicaid Other | Source: Ambulatory Visit | Attending: Obstetrics and Gynecology | Admitting: Obstetrics and Gynecology

## 2016-03-08 DIAGNOSIS — Z3493 Encounter for supervision of normal pregnancy, unspecified, third trimester: Secondary | ICD-10-CM | POA: Diagnosis present

## 2016-03-08 DIAGNOSIS — E860 Dehydration: Secondary | ICD-10-CM | POA: Diagnosis not present

## 2016-03-08 DIAGNOSIS — O9989 Other specified diseases and conditions complicating pregnancy, childbirth and the puerperium: Secondary | ICD-10-CM

## 2016-03-08 DIAGNOSIS — Z3A38 38 weeks gestation of pregnancy: Secondary | ICD-10-CM | POA: Insufficient documentation

## 2016-03-08 DIAGNOSIS — Z3689 Encounter for other specified antenatal screening: Secondary | ICD-10-CM

## 2016-03-08 LAB — URINALYSIS, ROUTINE W REFLEX MICROSCOPIC
Bilirubin Urine: NEGATIVE
Glucose, UA: NEGATIVE mg/dL
Hgb urine dipstick: NEGATIVE
Ketones, ur: 15 mg/dL — AB
Nitrite: NEGATIVE
PROTEIN: NEGATIVE mg/dL
SPECIFIC GRAVITY, URINE: 1.025 (ref 1.005–1.030)
pH: 6 (ref 5.0–8.0)

## 2016-03-08 LAB — URINE MICROSCOPIC-ADD ON: RBC / HPF: NONE SEEN RBC/hpf (ref 0–5)

## 2016-03-08 NOTE — Discharge Instructions (Signed)
Fetal Movement Counts Patient Name: __________________________________________________ Patient Due Date: ____________________ Performing a fetal movement count is highly recommended in high-risk pregnancies, but it is good for every pregnant woman to do. Your health care provider may ask you to start counting fetal movements at 28 weeks of the pregnancy. Fetal movements often increase:  After eating a full meal.  After physical activity.  After eating or drinking something sweet or cold.  At rest. Pay attention to when you feel the baby is most active. This will help you notice a pattern of your baby's sleep and wake cycles and what factors contribute to an increase in fetal movement. It is important to perform a fetal movement count at the same time each day when your baby is normally most active.  HOW TO COUNT FETAL MOVEMENTS 1. Find a quiet and comfortable area to sit or lie down on your left side. Lying on your left side provides the best blood and oxygen circulation to your baby. 2. Write down the day and time on a sheet of paper or in a journal. 3. Start counting kicks, flutters, swishes, rolls, or jabs in a 2-hour period. You should feel at least 10 movements within 2 hours. 4. If you do not feel 10 movements in 2 hours, wait 2-3 hours and count again. Look for a change in the pattern or not enough counts in 2 hours. SEEK MEDICAL CARE IF:  You feel less than 10 counts in 2 hours, tried twice.  There is no movement in over an hour.  The pattern is changing or taking longer each day to reach 10 counts in 2 hours.  You feel the baby is not moving as he or she usually does. Date: ____________ Movements: ____________ Start time: ____________ Finish time: ____________  Date: ____________ Movements: ____________ Start time: ____________ Finish time: ____________ Date: ____________ Movements: ____________ Start time: ____________ Finish time: ____________ Date: ____________ Movements:  ____________ Start time: ____________ Finish time: ____________ Date: ____________ Movements: ____________ Start time: ____________ Finish time: ____________ Date: ____________ Movements: ____________ Start time: ____________ Finish time: ____________ Date: ____________ Movements: ____________ Start time: ____________ Finish time: ____________ Date: ____________ Movements: ____________ Start time: ____________ Finish time: ____________  Date: ____________ Movements: ____________ Start time: ____________ Finish time: ____________ Date: ____________ Movements: ____________ Start time: ____________ Finish time: ____________ Date: ____________ Movements: ____________ Start time: ____________ Finish time: ____________ Date: ____________ Movements: ____________ Start time: ____________ Finish time: ____________ Date: ____________ Movements: ____________ Start time: ____________ Finish time: ____________ Date: ____________ Movements: ____________ Start time: ____________ Finish time: ____________ Date: ____________ Movements: ____________ Start time: ____________ Finish time: ____________  Date: ____________ Movements: ____________ Start time: ____________ Finish time: ____________ Date: ____________ Movements: ____________ Start time: ____________ Finish time: ____________ Date: ____________ Movements: ____________ Start time: ____________ Finish time: ____________ Date: ____________ Movements: ____________ Start time: ____________ Finish time: ____________ Date: ____________ Movements: ____________ Start time: ____________ Finish time: ____________ Date: ____________ Movements: ____________ Start time: ____________ Finish time: ____________ Date: ____________ Movements: ____________ Start time: ____________ Finish time: ____________  Date: ____________ Movements: ____________ Start time: ____________ Finish time: ____________ Date: ____________ Movements: ____________ Start time: ____________ Finish  time: ____________ Date: ____________ Movements: ____________ Start time: ____________ Finish time: ____________ Date: ____________ Movements: ____________ Start time: ____________ Finish time: ____________ Date: ____________ Movements: ____________ Start time: ____________ Finish time: ____________ Date: ____________ Movements: ____________ Start time: ____________ Finish time: ____________ Date: ____________ Movements: ____________ Start time: ____________ Finish time: ____________  Date: ____________ Movements: ____________ Start time: ____________ Finish   time: ____________ Date: ____________ Movements: ____________ Start time: ____________ Finish time: ____________ Date: ____________ Movements: ____________ Start time: ____________ Finish time: ____________ Date: ____________ Movements: ____________ Start time: ____________ Finish time: ____________ Date: ____________ Movements: ____________ Start time: ____________ Finish time: ____________ Date: ____________ Movements: ____________ Start time: ____________ Finish time: ____________ Date: ____________ Movements: ____________ Start time: ____________ Finish time: ____________  Date: ____________ Movements: ____________ Start time: ____________ Finish time: ____________ Date: ____________ Movements: ____________ Start time: ____________ Finish time: ____________ Date: ____________ Movements: ____________ Start time: ____________ Finish time: ____________ Date: ____________ Movements: ____________ Start time: ____________ Finish time: ____________ Date: ____________ Movements: ____________ Start time: ____________ Finish time: ____________ Date: ____________ Movements: ____________ Start time: ____________ Finish time: ____________ Date: ____________ Movements: ____________ Start time: ____________ Finish time: ____________  Date: ____________ Movements: ____________ Start time: ____________ Finish time: ____________ Date: ____________  Movements: ____________ Start time: ____________ Finish time: ____________ Date: ____________ Movements: ____________ Start time: ____________ Finish time: ____________ Date: ____________ Movements: ____________ Start time: ____________ Finish time: ____________ Date: ____________ Movements: ____________ Start time: ____________ Finish time: ____________ Date: ____________ Movements: ____________ Start time: ____________ Finish time: ____________ Date: ____________ Movements: ____________ Start time: ____________ Finish time: ____________  Date: ____________ Movements: ____________ Start time: ____________ Finish time: ____________ Date: ____________ Movements: ____________ Start time: ____________ Finish time: ____________ Date: ____________ Movements: ____________ Start time: ____________ Finish time: ____________ Date: ____________ Movements: ____________ Start time: ____________ Finish time: ____________ Date: ____________ Movements: ____________ Start time: ____________ Finish time: ____________ Date: ____________ Movements: ____________ Start time: ____________ Finish time: ____________   This information is not intended to replace advice given to you by your health care provider. Make sure you discuss any questions you have with your health care provider.   Document Released: 10/04/2006 Document Revised: 09/25/2014 Document Reviewed: 07/01/2012 Else[employer] Interactive Patient Education 2016 [employer] Braxton Hicks Contractions Contractions of the uterus can occur throughout pregnancy. Contractions are not always a sign that you are in labor.  WHAT ARE BRAXTON HICKS CONTRACTIONS?  Contractions that occur before labor are called Braxton Hicks contractions, or false labor. Toward the end of pregnancy (32-34 weeks), these contractions can develop more often and may become more forceful. This is not true labor because these contractions do not result in opening (dilatation) and thinning of  the cervix. They are sometimes difficult to tell apart from true labor because these contractions can be forceful and people have different pain tolerances. You should not feel embarrassed if you go to the hospital with false labor. Sometimes, the only way to tell if you are in true labor is for your health care provider to look for changes in the cervix. If there are no prenatal problems or other health problems associated with the pregnancy, it is completely safe to be sent home with false labor and await the onset of true labor. HOW CAN YOU TELL THE DIFFERENCE BETWEEN TRUE AND FALSE LABOR? False Labor  The contractions of false labor are usually shorter and not as hard as those of true labor.   The contractions are usually irregular.   The contractions are often felt in the front of the lower abdomen and in the groin.   The contractions may go away when you walk around or change positions while lying down.   The contractions get weaker and are shorter lasting as time goes on.   The contractions do not usually become progressively stronger, regular, and closer together as with true labor.  True Labor 5. Contractions in true   labor last 30-70 seconds, become very regular, usually become more intense, and increase in frequency.  6. The contractions do not go away with walking.  7. The discomfort is usually felt in the top of the uterus and spreads to the lower abdomen and low back.  8. True labor can be determined by your health care provider with an exam. This will show that the cervix is dilating and getting thinner.  WHAT TO REMEMBER  Keep up with your usual exercises and follow other instructions given by your health care provider.   Take medicines as directed by your health care provider.   Keep your regular prenatal appointments.   Eat and drink lightly if you think you are going into labor.   If Braxton Hicks contractions are making you uncomfortable:   Change  your position from lying down or resting to walking, or from walking to resting.   Sit and rest in a tub of warm water.   Drink 2-3 glasses of water. Dehydration may cause these contractions.   Do slow and deep breathing several times an hour.  WHEN SHOULD I SEEK IMMEDIATE MEDICAL CARE? Seek immediate medical care if:  Your contractions become stronger, more regular, and closer together.   You have fluid leaking or gushing from your vagina.   You have a fever.   You pass blood-tinged mucus.   You have vaginal bleeding.   You have continuous abdominal pain.   You have low back pain that you never had before.   You feel your baby's head pushing down and causing pelvic pressure.   Your baby is not moving as much as it used to.    This information is not intended to replace advice given to you by your health care provider. Make sure you discuss any questions you have with your health care provider.   Document Released: 09/04/2005 Document Revised: 09/09/2013 Document Reviewed: 06/16/2013 Else[employer] Interactive Patient Education 2016 [employer]  

## 2016-03-08 NOTE — MAU Note (Signed)
Patient is here with c/o ctxs. Patient denies any bleeding or LOF.

## 2016-03-08 NOTE — MAU Provider Note (Signed)
S:  Kelly Hensley is a 20 y.o. female G1P0000 @ 654w6d here for a labor evaluation. I was asked by the RN to evaluate the fetal tracing.  The patient is upset and anxious as this time due to anxiety of labor.   + fetal movements Denies vaginal bleeding or leaking of fluid    O:  GENERAL: Well-developed, well-nourished female in no acute distress.  LUNGS: Effort normal SKIN: Warm, dry and without erythema PSYCH: Normal mood and affect  Filed Vitals:   03/08/16 1522 03/08/16 1538 03/08/16 1603 03/08/16 1647  BP: 134/79   125/67  Pulse: 111 98 94 91  Temp: 98.9 F (37.2 C)   98.5 F (36.9 C)  TempSrc: Oral   Oral  Resp: 18   18  SpO2:  100% 99%    Fetal Tracing: Baseline: 140 bpm  Variability: Moderate  Accelerations: 15x15 Decelerations: None Toco: quiet  Initial tracing shows fetal tachycardia X 30 mins; baseline 170. Fetal tachycardia resolved   Results for orders placed or performed during the hospital encounter of 03/08/16 (from the past 48 hour(s))  Urinalysis, Routine w reflex microscopic ([facility])     Status: Abnormal   Collection Time: 03/08/16  3:40 PM  Result Value Ref Range   Color, Urine YELLOW YELLOW   APPearance CLEAR CLEAR   Specific Gravity, Urine 1.025 1.005 - 1.030   pH 6.0 5.0 - 8.0   Glucose, UA NEGATIVE NEGATIVE mg/dL   Hgb urine dipstick NEGATIVE NEGATIVE   Bilirubin Urine NEGATIVE NEGATIVE   Ketones, ur 15 (A) NEGATIVE mg/dL   Protein, ur NEGATIVE NEGATIVE mg/dL   Nitrite NEGATIVE NEGATIVE   Leukocytes, UA SMALL (A) NEGATIVE  Urine microscopic-add on     Status: Abnormal   Collection Time: 03/08/16  3:40 PM  Result Value Ref Range   Squamous Epithelial / LPF 6-30 (A) NONE SEEN   WBC, UA 0-5 0 - 5 WBC/hpf   RBC / HPF NONE SEEN 0 - 5 RBC/hpf   Bacteria, UA RARE (A) NONE SEEN   Urine-Other MUCOUS PRESENT     MDM:  Dilation: 1 Station: -3 Exam by:: Kayren EavesAshley Garvey RN    A:  Fetal tracing reactive, Category 1 tracing Cervix  unchanged from previous exam Patient not in active labor at this time UA shows mild dehydration (15 ketones)   P:  Discharge home in stable condition Fetal kick counts Return to MAU if symptoms worsen Labor precautions Increase PO fluid intake  Duane LopeJennifer I Maili Shutters, NP 03/08/2016 5:11 PM

## 2016-03-11 ENCOUNTER — Encounter (HOSPITAL_COMMUNITY): Payer: Self-pay | Admitting: Certified Nurse Midwife

## 2016-03-11 ENCOUNTER — Inpatient Hospital Stay (HOSPITAL_COMMUNITY)
Admission: AD | Admit: 2016-03-11 | Discharge: 2016-03-11 | Disposition: A | Discharge status: Home / Self Care | Payer: Medicaid Other | Source: Ambulatory Visit | Attending: Obstetrics and Gynecology | Admitting: Obstetrics and Gynecology

## 2016-03-11 DIAGNOSIS — Z3A39 39 weeks gestation of pregnancy: Secondary | ICD-10-CM | POA: Insufficient documentation

## 2016-03-11 DIAGNOSIS — Z87891 Personal history of nicotine dependence: Secondary | ICD-10-CM | POA: Diagnosis not present

## 2016-03-11 DIAGNOSIS — O471 False labor at or after 37 completed weeks of gestation: Secondary | ICD-10-CM

## 2016-03-11 DIAGNOSIS — O99213 Obesity complicating pregnancy, third trimester: Secondary | ICD-10-CM | POA: Diagnosis not present

## 2016-03-11 DIAGNOSIS — O0933 Supervision of pregnancy with insufficient antenatal care, third trimester: Secondary | ICD-10-CM

## 2016-03-11 DIAGNOSIS — D573 Sickle-cell trait: Secondary | ICD-10-CM | POA: Insufficient documentation

## 2016-03-11 DIAGNOSIS — O99013 Anemia complicating pregnancy, third trimester: Secondary | ICD-10-CM | POA: Diagnosis not present

## 2016-03-11 LAB — URINE MICROSCOPIC-ADD ON

## 2016-03-11 LAB — URINALYSIS, ROUTINE W REFLEX MICROSCOPIC
Bilirubin Urine: NEGATIVE
GLUCOSE, UA: NEGATIVE mg/dL
Hgb urine dipstick: NEGATIVE
Ketones, ur: NEGATIVE mg/dL
Nitrite: NEGATIVE
PH: 6.5 (ref 5.0–8.0)
PROTEIN: NEGATIVE mg/dL
SPECIFIC GRAVITY, URINE: 1.015 (ref 1.005–1.030)

## 2016-03-11 NOTE — MAU Provider Note (Signed)
  History   G1 at 39.4 wks in with c/o contractions since this morning. Pt has yet to received prenatal care. Pt has been counceled multiple times to get prenatal care. And counseled again today to get care.  CSN: [id number]  Arrival date and time: 03/11/16 1211   First Provider Initiated Contact with Patient 03/11/16 1312      Chief Complaint  Patient presents with  . Labor Eval   HPI  OB History    Gravida Para Term Preterm AB TAB SAB Ectopic Multiple Living   1 0 0 0 0 0 0 0 0 0       Past Medical History  Diagnosis Date  . Morbid obesity (HCC)   . Cellulitis and abscess of trunk   . Sickle cell trait (HCC)   . Anemia   . HPV (human papilloma virus) infection   . Migraines     Past Surgical History  Procedure Laterality Date  . Abcess drainage Left 2014    Family History  Problem Relation Age of Onset  . Diabetes Mother   . Sickle cell anemia Father     Social History  Substance Use Topics  . Smoking status: Former Smoker -- 0.25 packs/day    Types: Cigarettes  . Smokeless tobacco: None  . Alcohol Use: No    Allergies: No Known Allergies  No prescriptions prior to admission    Review of Systems  Constitutional: Negative.   HENT: Negative.   Eyes: Negative.   Cardiovascular: Negative.   Gastrointestinal: Positive for abdominal pain.  Genitourinary: Negative.   Musculoskeletal: Negative.   Skin: Negative.   Neurological: Negative.   Endo/Heme/Allergies: Negative.   Psychiatric/Behavioral: Negative.    Physical Exam   Blood pressure 122/77, pulse 85, temperature 99 F (37.2 C), temperature source Oral, resp. rate 18, last menstrual period 06/10/2015.  Physical Exam  Constitutional: She is oriented to person, place, and time. She appears well-developed and well-nourished.  HENT:  Head: Normocephalic.  Eyes: Pupils are equal, round, and reactive to light.  Neck: Normal range of motion.  Cardiovascular: Normal rate, regular rhythm, normal  heart sounds and intact distal pulses.   Respiratory: Effort normal and breath sounds normal.  GI: Soft. Bowel sounds are normal.  Genitourinary: Vagina normal and uterus normal.  Musculoskeletal: Normal range of motion.  Neurological: She is alert and oriented to person, place, and time. She has normal reflexes.  Skin: Skin is warm and dry.  Psychiatric: She has a normal mood and affect. Her behavior is normal. Judgment and thought content normal.    MAU Course  Procedures  MDM False labor  Assessment and Plan  SVE 1/th/-3, FHR pattern reactive. Will d/c home  Wyvonnia DuskyMarie Brayant Dorr 03/11/2016, 1:17 PM

## 2016-03-11 NOTE — Discharge Instructions (Signed)
Fetal Movement Counts °Patient Name: __________________________________________________ Patient Due Date: ____________________ °Performing a fetal movement count is highly recommended in high-risk pregnancies, but it is good for every pregnant woman to do. Your health care provider may ask you to start counting fetal movements at 28 weeks of the pregnancy. Fetal movements often increase: °· After eating a full meal. °· After physical activity. °· After eating or drinking something sweet or cold. °· At rest. °Pay attention to when you feel the baby is most active. This will help you notice a pattern of your baby's sleep and wake cycles and what factors contribute to an increase in fetal movement. It is important to perform a fetal movement count at the same time each day when your baby is normally most active.  °HOW TO COUNT FETAL MOVEMENTS °1. Find a quiet and comfortable area to sit or lie down on your left side. Lying on your left side provides the best blood and oxygen circulation to your baby. °2. Write down the day and time on a sheet of paper or in a journal. °3. Start counting kicks, flutters, swishes, rolls, or jabs in a 2-hour period. You should feel at least 10 movements within 2 hours. °4. If you do not feel 10 movements in 2 hours, wait 2-3 hours and count again. Look for a change in the pattern or not enough counts in 2 hours. °SEEK MEDICAL CARE IF: °· You feel less than 10 counts in 2 hours, tried twice. °· There is no movement in over an hour. °· The pattern is changing or taking longer each day to reach 10 counts in 2 hours. °· You feel the baby is not moving as he or she usually does. °Date: ____________ Movements: ____________ Start time: ____________ Finish time: ____________  °Date: ____________ Movements: ____________ Start time: ____________ Finish time: ____________ °Date: ____________ Movements: ____________ Start time: ____________ Finish time: ____________ °Date: ____________ Movements:  ____________ Start time: ____________ Finish time: ____________ °Date: ____________ Movements: ____________ Start time: ____________ Finish time: ____________ °Date: ____________ Movements: ____________ Start time: ____________ Finish time: ____________ °Date: ____________ Movements: ____________ Start time: ____________ Finish time: ____________ °Date: ____________ Movements: ____________ Start time: ____________ Finish time: ____________  °Date: ____________ Movements: ____________ Start time: ____________ Finish time: ____________ °Date: ____________ Movements: ____________ Start time: ____________ Finish time: ____________ °Date: ____________ Movements: ____________ Start time: ____________ Finish time: ____________ °Date: ____________ Movements: ____________ Start time: ____________ Finish time: ____________ °Date: ____________ Movements: ____________ Start time: ____________ Finish time: ____________ °Date: ____________ Movements: ____________ Start time: ____________ Finish time: ____________ °Date: ____________ Movements: ____________ Start time: ____________ Finish time: ____________  °Date: ____________ Movements: ____________ Start time: ____________ Finish time: ____________ °Date: ____________ Movements: ____________ Start time: ____________ Finish time: ____________ °Date: ____________ Movements: ____________ Start time: ____________ Finish time: ____________ °Date: ____________ Movements: ____________ Start time: ____________ Finish time: ____________ °Date: ____________ Movements: ____________ Start time: ____________ Finish time: ____________ °Date: ____________ Movements: ____________ Start time: ____________ Finish time: ____________ °Date: ____________ Movements: ____________ Start time: ____________ Finish time: ____________  °Date: ____________ Movements: ____________ Start time: ____________ Finish time: ____________ °Date: ____________ Movements: ____________ Start time: ____________ Finish  time: ____________ °Date: ____________ Movements: ____________ Start time: ____________ Finish time: ____________ °Date: ____________ Movements: ____________ Start time: ____________ Finish time: ____________ °Date: ____________ Movements: ____________ Start time: ____________ Finish time: ____________ °Date: ____________ Movements: ____________ Start time: ____________ Finish time: ____________ °Date: ____________ Movements: ____________ Start time: ____________ Finish time: ____________  °Date: ____________ Movements: ____________ Start time: ____________ Finish   time: ____________ °Date: ____________ Movements: ____________ Start time: ____________ Finish time: ____________ °Date: ____________ Movements: ____________ Start time: ____________ Finish time: ____________ °Date: ____________ Movements: ____________ Start time: ____________ Finish time: ____________ °Date: ____________ Movements: ____________ Start time: ____________ Finish time: ____________ °Date: ____________ Movements: ____________ Start time: ____________ Finish time: ____________ °Date: ____________ Movements: ____________ Start time: ____________ Finish time: ____________  °Date: ____________ Movements: ____________ Start time: ____________ Finish time: ____________ °Date: ____________ Movements: ____________ Start time: ____________ Finish time: ____________ °Date: ____________ Movements: ____________ Start time: ____________ Finish time: ____________ °Date: ____________ Movements: ____________ Start time: ____________ Finish time: ____________ °Date: ____________ Movements: ____________ Start time: ____________ Finish time: ____________ °Date: ____________ Movements: ____________ Start time: ____________ Finish time: ____________ °Date: ____________ Movements: ____________ Start time: ____________ Finish time: ____________  °Date: ____________ Movements: ____________ Start time: ____________ Finish time: ____________ °Date: ____________  Movements: ____________ Start time: ____________ Finish time: ____________ °Date: ____________ Movements: ____________ Start time: ____________ Finish time: ____________ °Date: ____________ Movements: ____________ Start time: ____________ Finish time: ____________ °Date: ____________ Movements: ____________ Start time: ____________ Finish time: ____________ °Date: ____________ Movements: ____________ Start time: ____________ Finish time: ____________ °Date: ____________ Movements: ____________ Start time: ____________ Finish time: ____________  °Date: ____________ Movements: ____________ Start time: ____________ Finish time: ____________ °Date: ____________ Movements: ____________ Start time: ____________ Finish time: ____________ °Date: ____________ Movements: ____________ Start time: ____________ Finish time: ____________ °Date: ____________ Movements: ____________ Start time: ____________ Finish time: ____________ °Date: ____________ Movements: ____________ Start time: ____________ Finish time: ____________ °Date: ____________ Movements: ____________ Start time: ____________ Finish time: ____________ °  °This information is not intended to replace advice given to you by your health care provider. Make sure you discuss any questions you have with your health care provider. °  °Document Released: 10/04/2006 Document Revised: 09/25/2014 Document Reviewed: 07/01/2012 °Elsevier Interactive Patient Education ©2016 [employer] °Vaginal Delivery °During delivery, your health care provider will help you give birth to your baby. During a vaginal delivery, you will work to push the baby out of your vagina. However, before you can push your baby out, a few things need to happen. The opening of your uterus (cervix) has to soften, thin out, and open up (dilate) all the way to 10 cm. Also, your baby has to move down from the uterus into your vagina.  °SIGNS OF LABOR  °Your health care provider will first need to make sure you  are in labor. Signs of labor include:  °· Passing what is called the mucous plug before labor begins. This is a small amount of blood-stained mucus. °· Having regular, painful uterine contractions.   °· The time between contractions gets shorter.   °· The discomfort and pain gradually get more intense. °· Contraction pains get worse when walking and do not go away when resting.   °· Your cervix becomes thinner (effacement) and dilates. °BEFORE THE DELIVERY °Once you are in labor and admitted into the hospital or care center, your health care provider may do the following:  °5. Perform a complete physical exam. °6. Review any complications related to pregnancy or labor.  °7. Check your blood pressure, pulse, temperature, and heart rate (vital signs).   °8. Determine if, and when, the rupture of amniotic membranes occurred. °9. Do a vaginal exam (using a sterile glove and lubricant) to determine:   °1. The position (presentation) of the baby. Is the baby's head presenting first (vertex) in the birth canal (vagina), or are the feet or buttocks first (breech)?   °2. The level (station) of the baby's head within   the birth canal.   °3. The effacement and dilatation of the cervix.   °10. An electronic fetal monitor is usually placed on your abdomen when you first arrive. This is used to monitor your contractions and the baby's heart rate. °1. When the monitor is on your abdomen (external fetal monitor), it can only pick up the frequency and length of your contractions. It cannot tell the strength of your contractions. °2. If it becomes necessary for your health care provider to know exactly how strong your contractions are or to see exactly what the baby's heart rate is doing, an internal monitor may be inserted into your vagina and uterus. Your health care provider will discuss the benefits and risks of using an internal monitor and obtain your permission before inserting the device. °3. Continuous fetal monitoring may be  needed if you have an epidural, are receiving certain medicines (such as oxytocin), or have pregnancy or labor complications. °11. An IV access tube may be placed into a vein in your arm to deliver fluids and medicines if necessary. °THREE STAGES OF LABOR AND DELIVERY °Normal labor and delivery is divided into three stages. °First Stage °This stage starts when you begin to contract regularly and your cervix begins to efface and dilate. It ends when your cervix is completely open (fully dilated). The first stage is the longest stage of labor and can last from 3 hours to 15 hours.  °Several methods are available to help with labor pain. You and your health care provider will decide which option is best for you. Options include:  °· Opioid medicines. These are strong pain medicines that you can get through your IV tube or as a shot into your muscle. These medicines lessen pain but do not make it go away completely.  °· Epidural. A medicine is given through a thin tube that is inserted in your back. The medicine numbs the lower part of your body and prevents any pain in that area. °· Paracervical pain medicine. This is an injection of an anesthetic on each side of your cervix.   °· You may request natural childbirth, which does not involve the use of pain medicines or an epidural during labor and delivery. Instead, you will use other things, such as breathing exercises, to help cope with the pain. °Second Stage °The second stage of labor begins when your cervix is fully dilated at 10 cm. It continues until you push your baby down through the birth canal and the baby is born. This stage can take only minutes or several hours. °· The location of your baby's head as it moves through the birth canal is reported as a number called a station. If the baby's head has not started its descent, the station is described as being at minus 3 (-3). When your baby's head is at the zero station, it is at the middle of the birth canal  and is engaged in the pelvis. The station of your baby helps indicate the progress of the second stage of labor. °· When your baby is born, your health care provider may hold the baby with his or her head lowered to prevent amniotic fluid, mucus, and blood from getting into the baby's lungs. The baby's mouth and nose may be suctioned with a small bulb syringe to remove any additional fluid. °· Your health care provider may then place the baby on your stomach. It is important to keep the baby from getting cold. To do this, the health care provider will dry   the baby off, place the baby directly on your skin (with no blankets between you and the baby), and cover the baby with warm, dry blankets.   °· The umbilical cord is cut. °Third Stage °During the third stage of labor, your health care provider will deliver the placenta (afterbirth) and make sure your bleeding is under control. The delivery of the placenta usually takes about 5 minutes but can take up to 30 minutes. After the placenta is delivered, a medicine may be given either by IV or injection to help contract the uterus and control bleeding. If you are planning to breastfeed, you can try to do so now. °After you deliver the placenta, your uterus should contract and get very firm. If your uterus does not remain firm, your health care provider will massage it. This is important because the contraction of the uterus helps cut off bleeding at the site where the placenta was attached to your uterus. If your uterus does not contract properly and stay firm, you may continue to bleed heavily. If there is a lot of bleeding, medicines may be given to contract the uterus and stop the bleeding.  °  °This information is not intended to replace advice given to you by your health care provider. Make sure you discuss any questions you have with your health care provider. °  °Document Released: 06/13/2008 Document Revised: 09/25/2014 Document Reviewed: 05/01/2012 °Elsevier  Interactive Patient Education ©2016 [employer] Inc[employer] ° °

## 2016-03-11 NOTE — MAU Note (Signed)
Pt states she has an increase in pressure and ctxs are 30 minutes apart and last "a long time". Pt denies vaginal bleeding or LOF. Fetus active.

## 2016-03-18 ENCOUNTER — Inpatient Hospital Stay (HOSPITAL_COMMUNITY)
Admission: AD | Admit: 2016-03-18 | Discharge: 2016-03-18 | Disposition: A | Discharge status: Home / Self Care | Payer: Medicaid Other | Source: Ambulatory Visit | Attending: Family Medicine | Admitting: Family Medicine

## 2016-03-18 ENCOUNTER — Encounter (HOSPITAL_COMMUNITY): Payer: Self-pay | Admitting: *Deleted

## 2016-03-18 DIAGNOSIS — I10 Essential (primary) hypertension: Secondary | ICD-10-CM | POA: Diagnosis present

## 2016-03-18 DIAGNOSIS — Z62810 Personal history of physical and sexual abuse in childhood: Secondary | ICD-10-CM

## 2016-03-18 DIAGNOSIS — R030 Elevated blood-pressure reading, without diagnosis of hypertension: Secondary | ICD-10-CM | POA: Diagnosis not present

## 2016-03-18 DIAGNOSIS — R0989 Other specified symptoms and signs involving the circulatory and respiratory systems: Secondary | ICD-10-CM | POA: Diagnosis not present

## 2016-03-18 DIAGNOSIS — O99213 Obesity complicating pregnancy, third trimester: Secondary | ICD-10-CM | POA: Diagnosis not present

## 2016-03-18 DIAGNOSIS — D582 Other hemoglobinopathies: Secondary | ICD-10-CM | POA: Diagnosis present

## 2016-03-18 DIAGNOSIS — A630 Anogenital (venereal) warts: Secondary | ICD-10-CM | POA: Diagnosis present

## 2016-03-18 DIAGNOSIS — Z87891 Personal history of nicotine dependence: Secondary | ICD-10-CM | POA: Diagnosis not present

## 2016-03-18 DIAGNOSIS — O99820 Streptococcus B carrier state complicating pregnancy: Secondary | ICD-10-CM

## 2016-03-18 DIAGNOSIS — Z3A40 40 weeks gestation of pregnancy: Secondary | ICD-10-CM | POA: Diagnosis not present

## 2016-03-18 DIAGNOSIS — D573 Sickle-cell trait: Secondary | ICD-10-CM | POA: Insufficient documentation

## 2016-03-18 DIAGNOSIS — O26893 Other specified pregnancy related conditions, third trimester: Secondary | ICD-10-CM | POA: Insufficient documentation

## 2016-03-18 HISTORY — DX: Anogenital (venereal) warts: A63.0

## 2016-03-18 LAB — PROTEIN / CREATININE RATIO, URINE
CREATININE, URINE: 90 mg/dL
Protein Creatinine Ratio: 0.19 mg/mg{Cre} — ABNORMAL HIGH (ref 0.00–0.15)
TOTAL PROTEIN, URINE: 17 mg/dL

## 2016-03-18 NOTE — MAU Provider Note (Signed)
Chief Complaint:  Hypertension   First Provider Initiated Contact with Patient 03/18/16 0821      Kelly Hensley is a 20 y.o. G1P0000 at 5440w2dwho presents to maternity admissions reporting being told by [facility] to come in for blood pressure check.. States they said it was high the other day. I can find no records indicating this. BP was 129/74 in March at first prenatal visit She reports good fetal movement, denies LOF, vaginal bleeding, vaginal itching/burning, urinary symptoms, h/a, dizziness, n/v, diarrhea, constipation or fever/chills.  She denies headache, visual changes or RUQ abdominal pain.   Hypertension This is a new problem. The current episode started in the past 7 days. The problem has been resolved since onset. Pertinent negatives include no anxiety, blurred vision, chest pain, headaches, malaise/fatigue, peripheral edema or shortness of breath. There are no associated agents to hypertension. There are no known risk factors for coronary artery disease. Past treatments include nothing. There are no compliance problems.    Past Medical History: Past Medical History  Diagnosis Date  . Morbid obesity (HCC)   . Cellulitis and abscess of trunk   . Sickle cell trait (HCC)   . Anemia   . HPV (human papilloma virus) infection   . Migraines     Past obstetric history: OB History  Gravida Para Term Preterm AB SAB TAB Ectopic Multiple Living  1 0 0 0 0 0 0 0 0 0     # Outcome Date GA Lbr Len/2nd Weight Sex Delivery Anes PTL Lv  1 Current               Past Surgical History: Past Surgical History  Procedure Laterality Date  . Abcess drainage Left 2014    Family History: Family History  Problem Relation Age of Onset  . Diabetes Mother   . Sickle cell anemia Father     Social History: Social History  Substance Use Topics  . Smoking status: Former Smoker -- 0.25 packs/day    Types: Cigarettes  . Smokeless tobacco: None  . Alcohol Use: No    Allergies: No  Known Allergies  Meds:  Prescriptions prior to admission  Medication Sig Dispense Refill Last Dose  . ferrous sulfate 325 (65 FE) MG tablet Take 325 mg by mouth at bedtime.   Past Week at Unknown time  . Prenatal Vit-Fe Fumarate-FA (PRENATAL MULTIVITAMIN) TABS tablet Take 1 tablet by mouth at bedtime.   Past Week at Unknown time    I have reviewed patient's Past Medical Hx, Surgical Hx, Family Hx, Social Hx, medications and allergies.   ROS:  Review of Systems  Constitutional: Negative for fever, chills, malaise/fatigue and fatigue.  Eyes: Negative for blurred vision and visual disturbance.  Respiratory: Negative for shortness of breath.   Cardiovascular: Negative for chest pain.  Gastrointestinal: Negative for nausea, vomiting, diarrhea and constipation.  Genitourinary: Negative for dysuria, vaginal bleeding and pelvic pain.  Neurological: Negative for dizziness, seizures, syncope and headaches.   Other systems negative  Physical Exam  Patient Vitals for the past 24 hrs:  BP Temp Temp src Pulse Resp  03/18/16 0816 125/72 mmHg 98.3 F (36.8 C) Oral 93 18   Filed Vitals:   03/18/16 0830 03/18/16 0845 03/18/16 0859 03/18/16 0937  BP: 115/69 83/55 96/55  110/68  Pulse: 99 94 87 86  Temp:      TempSrc:      Resp:        Constitutional: Well-developed, well-nourished female in no acute distress.  Cardiovascular: normal rate and rhythm Respiratory: normal effort, clear to auscultation bilaterally GI: Abd soft, non-tender, gravid appropriate for gestational age.   No rebound or guarding. MS: Extremities nontender, no edema, normal ROM Neurologic: Alert and oriented x 4.  DTRs minimal. No clonus GU: Neg CVAT.  Dilation: 1 Effacement (%): 60 Cervical Position: Middle Station: -3 Exam by:: [email or web address]     FHT:  Baseline 140 , moderate variability, accelerations present, no decelerations Contractions:  Irregular and mild   Labs: No results found for this or any previous visit  (from the past 24 hour(s)).   Labs not indicated in setting of low blood pressures  Imaging:  No results found.  MAU Course/MDM:  NST reviewed Not in labor No hypertension identified No symptoms of preeclampsia Has IOL scheduled for next week for postdates Labor precautions reviewed in detail, and IOL discussed  Assessment: SIUP at 8340w2d  Labile blood pressure, probably spurious No evidence of gestational hypertension  Plan: Discharge home Labor precautions and fetal kick counts Follow up in a week for IOL as scheduled    Medication List    ASK your doctor about these medications        ferrous sulfate 325 (65 FE) MG tablet  Take 325 mg by mouth at bedtime.     prenatal multivitamin Tabs tablet  Take 1 tablet by mouth at bedtime.       Pt stable at time of discharge.  Wynelle BourgeoisMarie Elliet Goodnow CNM, MSN Certified Nurse-Midwife 03/18/2016 8:21 AM

## 2016-03-18 NOTE — MAU Note (Signed)
Health dept sent pt in, they had called to tell us she was coming in for BP eval/check.

## 2016-03-18 NOTE — Discharge Instructions (Signed)
Hypertension Hypertension is another name for high blood pressure. High blood pressure forces your heart to work harder to pump blood. A blood pressure reading has two numbers, which includes a higher number over a lower number (example: 110/72). HOME CARE   Have your blood pressure rechecked by your doctor.  Only take medicine as told by your doctor. Follow the directions carefully. The medicine does not work as well if you skip doses. Skipping doses also puts you at risk for problems.  Do not smoke.  Monitor your blood pressure at home as told by your doctor. GET HELP IF:  You think you are having a reaction to the medicine you are taking.  You have repeat headaches or feel dizzy.  You have puffiness (swelling) in your ankles.  You have trouble with your vision. GET HELP RIGHT AWAY IF:   You get a very bad headache and are confused.  You feel weak, numb, or faint.  You get chest or belly (abdominal) pain.  You throw up (vomit).  You cannot breathe very well. MAKE SURE YOU:   Understand these instructions.  Will watch your condition.  Will get help right away if you are not doing well or get worse.   This information is not intended to replace advice given to you by your health care provider. Make sure you discuss any questions you have with your health care provider.   Document Released: 02/21/2008 Document Revised: 09/09/2013 Document Reviewed: 06/27/2013 Elsevier Interactive Patient Education 2016 [employer] Vaginal Delivery During delivery, your health care provider will help you give birth to your baby. During a vaginal delivery, you will work to push the baby out of your vagina. However, before you can push your baby out, a few things need to happen. The opening of your uterus (cervix) has to soften, thin out, and open up (dilate) all the way to 10 cm. Also, your baby has to move down from the uterus into your vagina.  SIGNS OF LABOR  Your health care provider  will first need to make sure you are in labor. Signs of labor include:   Passing what is called the mucous plug before labor begins. This is a small amount of blood-stained mucus.  Having regular, painful uterine contractions.   The time between contractions gets shorter.   The discomfort and pain gradually get more intense.  Contraction pains get worse when walking and do not go away when resting.   Your cervix becomes thinner (effacement) and dilates. BEFORE THE DELIVERY Once you are in labor and admitted into the hospital or care center, your health care provider may do the following:   Perform a complete physical exam.  Review any complications related to pregnancy or labor.  Check your blood pressure, pulse, temperature, and heart rate (vital signs).   Determine if, and when, the rupture of amniotic membranes occurred.  Do a vaginal exam (using a sterile glove and lubricant) to determine:   The position (presentation) of the baby. Is the baby's head presenting first (vertex) in the birth canal (vagina), or are the feet or buttocks first (breech)?   The level (station) of the baby's head within the birth canal.   The effacement and dilatation of the cervix.   An electronic fetal monitor is usually placed on your abdomen when you first arrive. This is used to monitor your contractions and the baby's heart rate.  When the monitor is on your abdomen (external fetal monitor), it can only pick up  the frequency and length of your contractions. It cannot tell the strength of your contractions.  If it becomes necessary for your health care provider to know exactly how strong your contractions are or to see exactly what the baby's heart rate is doing, an internal monitor may be inserted into your vagina and uterus. Your health care provider will discuss the benefits and risks of using an internal monitor and obtain your permission before inserting the device.  Continuous  fetal monitoring may be needed if you have an epidural, are receiving certain medicines (such as oxytocin), or have pregnancy or labor complications.  An IV access tube may be placed into a vein in your arm to deliver fluids and medicines if necessary. THREE STAGES OF LABOR AND DELIVERY Normal labor and delivery is divided into three stages. First Stage This stage starts when you begin to contract regularly and your cervix begins to efface and dilate. It ends when your cervix is completely open (fully dilated). The first stage is the longest stage of labor and can last from 3 hours to 15 hours.  Several methods are available to help with labor pain. You and your health care provider will decide which option is best for you. Options include:   Opioid medicines. These are strong pain medicines that you can get through your IV tube or as a shot into your muscle. These medicines lessen pain but do not make it go away completely.  Epidural. A medicine is given through a thin tube that is inserted in your back. The medicine numbs the lower part of your body and prevents any pain in that area.  Paracervical pain medicine. This is an injection of an anesthetic on each side of your cervix.   You may request natural childbirth, which does not involve the use of pain medicines or an epidural during labor and delivery. Instead, you will use other things, such as breathing exercises, to help cope with the pain. Second Stage The second stage of labor begins when your cervix is fully dilated at 10 cm. It continues until you push your baby down through the birth canal and the baby is born. This stage can take only minutes or several hours.  The location of your baby's head as it moves through the birth canal is reported as a number called a station. If the baby's head has not started its descent, the station is described as being at minus 3 (-3). When your baby's head is at the zero station, it is at the middle  of the birth canal and is engaged in the pelvis. The station of your baby helps indicate the progress of the second stage of labor.  When your baby is born, your health care provider may hold the baby with his or her head lowered to prevent amniotic fluid, mucus, and blood from getting into the baby's lungs. The baby's mouth and nose may be suctioned with a small bulb syringe to remove any additional fluid.  Your health care provider may then place the baby on your stomach. It is important to keep the baby from getting cold. To do this, the health care provider will dry the baby off, place the baby directly on your skin (with no blankets between you and the baby), and cover the baby with warm, dry blankets.   The umbilical cord is cut. Third Stage During the third stage of labor, your health care provider will deliver the placenta (afterbirth) and make sure your bleeding is  under control. The delivery of the placenta usually takes about 5 minutes but can take up to 30 minutes. After the placenta is delivered, a medicine may be given either by IV or injection to help contract the uterus and control bleeding. If you are planning to breastfeed, you can try to do so now. After you deliver the placenta, your uterus should contract and get very firm. If your uterus does not remain firm, your health care provider will massage it. This is important because the contraction of the uterus helps cut off bleeding at the site where the placenta was attached to your uterus. If your uterus does not contract properly and stay firm, you may continue to bleed heavily. If there is a lot of bleeding, medicines may be given to contract the uterus and stop the bleeding.    This information is not intended to replace advice given to you by your health care provider. Make sure you discuss any questions you have with your health care provider.   Document Released: 06/13/2008 Document Revised: 09/25/2014 Document Reviewed:  05/01/2012 Elsevier Interactive Patient Education [employer]

## 2016-03-18 NOTE — MAU Note (Signed)
Pt states she was sent by the health department for a blood pressure check.

## 2016-03-22 ENCOUNTER — Telehealth (HOSPITAL_COMMUNITY): Payer: Self-pay | Admitting: *Deleted

## 2016-03-22 NOTE — Telephone Encounter (Signed)
Preadmission screen  

## 2016-03-24 ENCOUNTER — Inpatient Hospital Stay (HOSPITAL_COMMUNITY): Payer: Medicaid Other | Admitting: Anesthesiology

## 2016-03-24 ENCOUNTER — Encounter (HOSPITAL_COMMUNITY): Payer: Self-pay

## 2016-03-24 ENCOUNTER — Inpatient Hospital Stay (HOSPITAL_COMMUNITY)
Admission: RE | Admit: 2016-03-24 | Discharge: 2016-03-27 | DRG: 774 | Disposition: A | Discharge status: Home / Self Care | Payer: Medicaid Other | Source: Ambulatory Visit | Attending: Obstetrics & Gynecology | Admitting: Obstetrics & Gynecology

## 2016-03-24 VITALS — BP 123/66 | HR 71 | Temp 98.1°F | Resp 19 | Ht 67.0 in | Wt 271.0 lb

## 2016-03-24 DIAGNOSIS — D573 Sickle-cell trait: Secondary | ICD-10-CM | POA: Diagnosis present

## 2016-03-24 DIAGNOSIS — Z833 Family history of diabetes mellitus: Secondary | ICD-10-CM | POA: Diagnosis not present

## 2016-03-24 DIAGNOSIS — A630 Anogenital (venereal) warts: Secondary | ICD-10-CM | POA: Diagnosis present

## 2016-03-24 DIAGNOSIS — Z6841 Body Mass Index (BMI) 40.0 and over, adult: Secondary | ICD-10-CM | POA: Diagnosis not present

## 2016-03-24 DIAGNOSIS — O9832 Other infections with a predominantly sexual mode of transmission complicating childbirth: Secondary | ICD-10-CM | POA: Diagnosis present

## 2016-03-24 DIAGNOSIS — O480 Post-term pregnancy: Principal | ICD-10-CM | POA: Diagnosis present

## 2016-03-24 DIAGNOSIS — Z87891 Personal history of nicotine dependence: Secondary | ICD-10-CM | POA: Diagnosis not present

## 2016-03-24 DIAGNOSIS — O99214 Obesity complicating childbirth: Secondary | ICD-10-CM | POA: Diagnosis present

## 2016-03-24 DIAGNOSIS — O99824 Streptococcus B carrier state complicating childbirth: Secondary | ICD-10-CM | POA: Diagnosis present

## 2016-03-24 DIAGNOSIS — O99825 Streptococcus B carrier state complicating the puerperium: Secondary | ICD-10-CM | POA: Diagnosis not present

## 2016-03-24 DIAGNOSIS — Z3A41 41 weeks gestation of pregnancy: Secondary | ICD-10-CM | POA: Diagnosis not present

## 2016-03-24 DIAGNOSIS — O9902 Anemia complicating childbirth: Secondary | ICD-10-CM | POA: Diagnosis present

## 2016-03-24 DIAGNOSIS — Z832 Family history of diseases of the blood and blood-forming organs and certain disorders involving the immune mechanism: Secondary | ICD-10-CM | POA: Diagnosis not present

## 2016-03-24 DIAGNOSIS — O99820 Streptococcus B carrier state complicating pregnancy: Secondary | ICD-10-CM

## 2016-03-24 LAB — CBC
HCT: 31.5 % — ABNORMAL LOW (ref 36.0–46.0)
Hemoglobin: 11.3 g/dL — ABNORMAL LOW (ref 12.0–15.0)
MCH: 24.7 pg — AB (ref 26.0–34.0)
MCHC: 35.9 g/dL (ref 30.0–36.0)
MCV: 68.8 fL — ABNORMAL LOW (ref 78.0–100.0)
PLATELETS: 332 10*3/uL (ref 150–400)
RBC: 4.58 MIL/uL (ref 3.87–5.11)
RDW: 15.4 % (ref 11.5–15.5)
WBC: 7.4 10*3/uL (ref 4.0–10.5)

## 2016-03-24 LAB — ABO/RH: ABO/RH(D): B POS

## 2016-03-24 LAB — TYPE AND SCREEN
ABO/RH(D): B POS
ANTIBODY SCREEN: NEGATIVE

## 2016-03-24 LAB — OB RESULTS CONSOLE GBS: STREP GROUP B AG: POSITIVE

## 2016-03-24 LAB — RPR: RPR: NONREACTIVE

## 2016-03-24 MED ORDER — PENICILLIN G POTASSIUM 5000000 UNITS IJ SOLR
5.0000 10*6.[IU] | Freq: Once | INTRAVENOUS | Status: AC
  Administered 2016-03-24: 5 10*6.[IU] via INTRAVENOUS
  Filled 2016-03-24: qty 5

## 2016-03-24 MED ORDER — LACTATED RINGERS IV SOLN: 500.0000 mL | Freq: Once | INTRAVENOUS | Status: DC

## 2016-03-24 MED ORDER — ONDANSETRON HCL 4 MG/2ML IJ SOLN: 4.0000 mg | Freq: Four times a day (QID) | INTRAMUSCULAR | Status: DC | PRN

## 2016-03-24 MED ORDER — OXYCODONE-ACETAMINOPHEN 5-325 MG PO TABS: 2.0000 | ORAL_TABLET | ORAL | Status: DC | PRN

## 2016-03-24 MED ORDER — PHENYLEPHRINE 40 MCG/ML (10ML) SYRINGE FOR IV PUSH (FOR BLOOD PRESSURE SUPPORT)
80.0000 ug | PREFILLED_SYRINGE | INTRAVENOUS | Status: DC | PRN
  Filled 2016-03-24: qty 5

## 2016-03-24 MED ORDER — MISOPROSTOL 200 MCG PO TABS
50.0000 ug | ORAL_TABLET | ORAL | Status: DC
Start: 2016-03-24 — End: 2016-03-25
  Administered 2016-03-24: 50 ug via ORAL
  Filled 2016-03-24: qty 0.5

## 2016-03-24 MED ORDER — OXYTOCIN BOLUS FROM INFUSION: 500.0000 mL | INTRAVENOUS | Status: DC

## 2016-03-24 MED ORDER — LACTATED RINGERS IV SOLN
INTRAVENOUS | Status: DC
  Administered 2016-03-24 – 2016-03-25 (×4): via INTRAVENOUS

## 2016-03-24 MED ORDER — DIPHENHYDRAMINE HCL 50 MG/ML IJ SOLN: 12.5000 mg | INTRAMUSCULAR | Status: DC | PRN

## 2016-03-24 MED ORDER — EPHEDRINE 5 MG/ML INJ
10.0000 mg | INTRAVENOUS | Status: DC | PRN
  Filled 2016-03-24: qty 2

## 2016-03-24 MED ORDER — LIDOCAINE HCL (PF) 1 % IJ SOLN
INTRAMUSCULAR | Status: DC | PRN
  Administered 2016-03-24: 2 mL via EPIDURAL
  Administered 2016-03-24: 5 mL via EPIDURAL
  Administered 2016-03-24: 3 mL via EPIDURAL

## 2016-03-24 MED ORDER — PENICILLIN G POTASSIUM 5000000 UNITS IJ SOLR
2.5000 10*6.[IU] | INTRAVENOUS | Status: DC
  Administered 2016-03-24 – 2016-03-25 (×5): 2.5 10*6.[IU] via INTRAVENOUS
  Filled 2016-03-24 (×7): qty 2.5

## 2016-03-24 MED ORDER — ACETAMINOPHEN 325 MG PO TABS: 650.0000 mg | ORAL_TABLET | ORAL | Status: DC | PRN

## 2016-03-24 MED ORDER — LACTATED RINGERS IV SOLN
500.0000 mL | Freq: Once | INTRAVENOUS | Status: DC
Start: 2016-03-24 — End: 2016-03-27

## 2016-03-24 MED ORDER — TERBUTALINE SULFATE 1 MG/ML IJ SOLN
0.2500 mg | Freq: Once | INTRAMUSCULAR | Status: DC | PRN
  Filled 2016-03-24: qty 1

## 2016-03-24 MED ORDER — LACTATED RINGERS IV SOLN: 500.0000 mL | INTRAVENOUS | Status: DC | PRN

## 2016-03-24 MED ORDER — OXYCODONE-ACETAMINOPHEN 5-325 MG PO TABS
1.0000 | ORAL_TABLET | ORAL | Status: DC | PRN
Start: 2016-03-24 — End: 2016-03-25

## 2016-03-24 MED ORDER — LIDOCAINE HCL (PF) 1 % IJ SOLN
30.0000 mL | INTRAMUSCULAR | Status: DC | PRN
  Filled 2016-03-24: qty 30

## 2016-03-24 MED ORDER — OXYTOCIN 40 UNITS IN LACTATED RINGERS INFUSION - SIMPLE MED: 2.5000 [IU]/h | INTRAVENOUS | Status: DC

## 2016-03-24 MED ORDER — SOD CITRATE-CITRIC ACID 500-334 MG/5ML PO SOLN: 30.0000 mL | ORAL | Status: DC | PRN

## 2016-03-24 MED ORDER — FENTANYL CITRATE (PF) 100 MCG/2ML IJ SOLN
100.0000 ug | INTRAMUSCULAR | Status: DC | PRN
  Administered 2016-03-24 (×2): 100 ug via INTRAVENOUS
  Filled 2016-03-24 (×2): qty 2

## 2016-03-24 MED ORDER — OXYTOCIN 40 UNITS IN LACTATED RINGERS INFUSION - SIMPLE MED
1.0000 m[IU]/min | INTRAVENOUS | Status: DC
  Administered 2016-03-24: 2 m[IU]/min via INTRAVENOUS
  Filled 2016-03-24: qty 1000

## 2016-03-24 MED ORDER — FLEET ENEMA 7-19 GM/118ML RE ENEM: 1.0000 | ENEMA | RECTAL | Status: DC | PRN

## 2016-03-24 MED ORDER — PHENYLEPHRINE 40 MCG/ML (10ML) SYRINGE FOR IV PUSH (FOR BLOOD PRESSURE SUPPORT)
80.0000 ug | PREFILLED_SYRINGE | INTRAVENOUS | Status: DC | PRN
  Filled 2016-03-24: qty 10
  Filled 2016-03-24: qty 5

## 2016-03-24 MED ORDER — FENTANYL 2.5 MCG/ML BUPIVACAINE 1/10 % EPIDURAL INFUSION (WH - ANES)
14.0000 mL/h | INTRAMUSCULAR | Status: DC | PRN
  Administered 2016-03-24 – 2016-03-25 (×4): 14 mL/h via EPIDURAL
  Filled 2016-03-24 (×4): qty 125
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
## 2016-03-24 NOTE — Anesthesia Procedure Notes (Signed)
Epidural Patient location during procedure: OB  Staffing Anesthesiologist: Paola Aleshire EDWARD Performed by: anesthesiologist   Preanesthetic Checklist Completed: patient identified, pre-op evaluation, timeout performed, IV checked, risks and benefits discussed and monitors and equipment checked  Epidural Patient position: sitting Prep: DuraPrep Patient monitoring: blood pressure and continuous pulse ox Approach: midline Location: L3-L4 Injection technique: LOR air  Needle:  Needle type: Tuohy  Needle gauge: 17 G Needle length: 9 cm Needle insertion depth: 8 cm Catheter size: 19 Gauge Catheter at skin depth: 13 cm Test dose: negative and Other (1% Lidocaine)  Additional Notes Patient identified.  Risk benefits discussed including failed block, incomplete pain control, headache, nerve damage, paralysis, blood pressure changes, nausea, vomiting, reactions to medication both toxic or allergic, and postpartum back pain.  Patient expressed understanding and wished to proceed.  All questions were answered.  Sterile technique used throughout procedure and epidural site dressed with sterile barrier dressing. No paresthesia or other complications noted. The patient did not experience any signs of intravascular injection such as tinnitus or metallic taste in mouth nor signs of intrathecal spread such as rapid motor block. Please see nursing notes for vital signs. Reason for block:procedure for pain   

## 2016-03-24 NOTE — Progress Notes (Signed)
Patient ID: Kelly Hensley, female   DOB: 10/28/1995, 20 y.o.   MRN: [id number] Doing well, not contracting much  Filed Vitals:   03/24/16 0752  BP: 143/86  Pulse: 78  Temp: 98 F (36.7 C)  TempSrc: Oral  Resp: 18  Height: 5\' 7"  (1.702 m)  Weight: 271 lb (122.925 kg)   FHR reactive UCs occasional  Category 1 FHR   Foley inserted into cervix and inflated to 60ml  Dilation: 2 Effacement (%): 40 Cervical Position: Middle Station: -3 Presentation: Vertex Exam by:: Devra DoppAmber Knox, RN  Plan Cytotec with Foley then switch to Pitocin

## 2016-03-24 NOTE — Anesthesia Preprocedure Evaluation (Signed)
Anesthesia Evaluation  Patient identified by MRN, date of birth, ID band Patient awake    Reviewed: Allergy & Precautions, NPO status , Patient's Chart, lab work & pertinent test results  Airway Mallampati: III  TM Distance: >3 FB Neck ROM: Full    Dental  (+) Teeth Intact, Dental Advisory Given   Pulmonary former smoker,    Pulmonary exam normal breath sounds clear to auscultation       Cardiovascular negative cardio ROS Normal cardiovascular exam Rhythm:Regular Rate:Normal     Neuro/Psych  Headaches, negative psych ROS   GI/Hepatic negative GI ROS, Neg liver ROS,   Endo/Other  Morbid obesity  Renal/GU negative Renal ROS     Musculoskeletal negative musculoskeletal ROS (+)   Abdominal   Peds  Hematology  (+) Blood dyscrasia, Sickle cell trait and anemia , Plt 332k   Anesthesia Other Findings Day of surgery medications reviewed with the patient.  Reproductive/Obstetrics (+) Pregnancy                             Anesthesia Physical Anesthesia Plan  ASA: III  Anesthesia Plan: Epidural   Post-op Pain Management:    Induction:   Airway Management Planned:   Additional Equipment:   Intra-op Plan:   Post-operative Plan:   Informed Consent: I have reviewed the patients History and Physical, chart, labs and discussed the procedure including the risks, benefits and alternatives for the proposed anesthesia with the patient or authorized representative who has indicated his/her understanding and acceptance.   Dental advisory given  Plan Discussed with:   Anesthesia Plan Comments: (Patient identified. Risks/Benefits/Options discussed with patient including but not limited to bleeding, infection, nerve damage, paralysis, failed block, incomplete pain control, headache, blood pressure changes, nausea, vomiting, reactions to medication both or allergic, itching and postpartum back pain.  Confirmed with bedside nurse the patient's most recent platelet count. Confirmed with patient that they are not currently taking any anticoagulation, have any bleeding history or any family history of bleeding disorders. Patient expressed understanding and wished to proceed. All questions were answered. )        Anesthesia Quick Evaluation

## 2016-03-24 NOTE — Anesthesia Pain Management Evaluation Note (Signed)
  CRNA Pain Management Visit Note  Patient: Kelly Hensley, 20 y.o., female  "Hello [facility] am a [facility]. We have an anesthesia team available at all times to provide care throughout the hospital, including epidural management and anesthesia for C-section. I don't know your plan for the delivery whether it a natural birth, water birth, IV sedation, nitrous supplementation, doula or epidural, but we want to meet your pain goals."   1.Was your pain managed to your expectations on prior hospitalizations?   No prior hospitalizations  2.What is your expectation for pain management during this hospitalization?     Epidural  3.How can we help you reach that goal? epidural  Record the patient's initial score and the patient's pain goal.   Pain: 9 not ready for epidural at this time  Pain Goal: 5 [facility] wants you to be able to say your pain was always managed very well.  Kelly Hensley,Kelly Hensley 03/24/2016

## 2016-03-24 NOTE — Progress Notes (Signed)
Labor Progress Note Kelly Hensley is a 20 y.o. G1P0000 at 5015w1d presented for  IOL postdates. Late PNC. GBS positive   S:  No concerns doing well   O:  BP 120/77 mmHg  Pulse 77  Temp(Src) 98.6 F (37 C) (Oral)  Resp 18  Ht 5\' 7"  (1.702 m)  Wt 122.925 kg (271 lb)  BMI 42.43 kg/m2  LMP 06/10/2015 EFM: 140-150/moderate variability/15x15 Ctx every 2.5 minutes   CVE: Dilation: 4 Effacement (%): 60, 70 Cervical Position: Middle Station: -3 Presentation: Vertex Exam by:: Devra DoppAmber Knox, RN    A&P: 20 y.o. G1P0000 8315w1d IOL postdates. Late PNC. GBS positive  #Labor: on pitocin started @ 1830 #Pain: epidural #FWB: Cat 1 #GBS positive > PCN  Palma HolterKanishka G Elgin Carn, MD 9:53 PM

## 2016-03-25 ENCOUNTER — Encounter (HOSPITAL_COMMUNITY): Payer: Self-pay

## 2016-03-25 DIAGNOSIS — Z6841 Body Mass Index (BMI) 40.0 and over, adult: Secondary | ICD-10-CM

## 2016-03-25 DIAGNOSIS — O99214 Obesity complicating childbirth: Secondary | ICD-10-CM

## 2016-03-25 DIAGNOSIS — O9902 Anemia complicating childbirth: Secondary | ICD-10-CM

## 2016-03-25 DIAGNOSIS — E669 Obesity, unspecified: Secondary | ICD-10-CM

## 2016-03-25 DIAGNOSIS — O99825 Streptococcus B carrier state complicating the puerperium: Secondary | ICD-10-CM

## 2016-03-25 DIAGNOSIS — Z3A41 41 weeks gestation of pregnancy: Secondary | ICD-10-CM

## 2016-03-25 DIAGNOSIS — D573 Sickle-cell trait: Secondary | ICD-10-CM

## 2016-03-25 DIAGNOSIS — O480 Post-term pregnancy: Secondary | ICD-10-CM

## 2016-03-25 MED ORDER — SIMETHICONE 80 MG PO CHEW: 80.0000 mg | CHEWABLE_TABLET | ORAL | Status: DC | PRN

## 2016-03-25 MED ORDER — VITAMIN K1 1 MG/0.5ML IJ SOLN
INTRAMUSCULAR | Status: AC
  Filled 2016-03-25: qty 0.5

## 2016-03-25 MED ORDER — OXYCODONE HCL 5 MG PO TABS
10.0000 mg | ORAL_TABLET | Freq: Four times a day (QID) | ORAL | Status: DC | PRN
  Administered 2016-03-26 (×2): 10 mg via ORAL
  Filled 2016-03-25 (×2): qty 2

## 2016-03-25 MED ORDER — DIPHENHYDRAMINE HCL 25 MG PO CAPS: 25.0000 mg | ORAL_CAPSULE | Freq: Four times a day (QID) | ORAL | Status: DC | PRN

## 2016-03-25 MED ORDER — ONDANSETRON HCL 4 MG PO TABS: 4.0000 mg | ORAL_TABLET | ORAL | Status: DC | PRN

## 2016-03-25 MED ORDER — OXYCODONE HCL 5 MG PO TABS
5.0000 mg | ORAL_TABLET | Freq: Four times a day (QID) | ORAL | Status: DC | PRN
  Administered 2016-03-25: 5 mg via ORAL
  Filled 2016-03-25: qty 1

## 2016-03-25 MED ORDER — DIBUCAINE 1 % RE OINT: 1.0000 "application " | TOPICAL_OINTMENT | RECTAL | Status: DC | PRN

## 2016-03-25 MED ORDER — PRENATAL MULTIVITAMIN CH
1.0000 | ORAL_TABLET | Freq: Every day | ORAL | Status: DC
  Administered 2016-03-26: 1 via ORAL
  Filled 2016-03-25: qty 1

## 2016-03-25 MED ORDER — OXYTOCIN 40 UNITS IN LACTATED RINGERS INFUSION - SIMPLE MED: 2.5000 [IU]/h | INTRAVENOUS | Status: DC | PRN

## 2016-03-25 MED ORDER — COCONUT OIL OIL
1.0000 "application " | TOPICAL_OIL | Status: DC | PRN
  Administered 2016-03-27: 1 via TOPICAL
  Filled 2016-03-25: qty 120

## 2016-03-25 MED ORDER — TETANUS-DIPHTH-ACELL PERTUSSIS 5-2.5-18.5 LF-MCG/0.5 IM SUSP
0.5000 mL | Freq: Once | INTRAMUSCULAR | Status: DC
Start: 2016-03-26 — End: 2016-03-27

## 2016-03-25 MED ORDER — SENNOSIDES-DOCUSATE SODIUM 8.6-50 MG PO TABS
1.0000 | ORAL_TABLET | Freq: Every evening | ORAL | Status: DC | PRN
  Administered 2016-03-26: 1 via ORAL
  Filled 2016-03-25 (×2): qty 1

## 2016-03-25 MED ORDER — MAGNESIUM HYDROXIDE 400 MG/5ML PO SUSP: 30.0000 mL | ORAL | Status: DC | PRN

## 2016-03-25 MED ORDER — ONDANSETRON HCL 4 MG/2ML IJ SOLN: 4.0000 mg | INTRAMUSCULAR | Status: DC | PRN

## 2016-03-25 MED ORDER — ACETAMINOPHEN 325 MG PO TABS
650.0000 mg | ORAL_TABLET | ORAL | Status: DC | PRN
  Administered 2016-03-25 – 2016-03-26 (×3): 650 mg via ORAL
  Filled 2016-03-25 (×4): qty 2

## 2016-03-25 MED ORDER — IBUPROFEN 600 MG PO TABS
600.0000 mg | ORAL_TABLET | Freq: Four times a day (QID) | ORAL | Status: DC
  Administered 2016-03-25 – 2016-03-27 (×6): 600 mg via ORAL
  Filled 2016-03-25 (×6): qty 1

## 2016-03-25 MED ORDER — WITCH HAZEL-GLYCERIN EX PADS: 1.0000 "application " | MEDICATED_PAD | CUTANEOUS | Status: DC | PRN

## 2016-03-25 MED ORDER — BENZOCAINE-MENTHOL 20-0.5 % EX AERO
1.0000 "application " | INHALATION_SPRAY | CUTANEOUS | Status: DC | PRN
  Filled 2016-03-25: qty 56

## 2016-03-25 NOTE — Progress Notes (Signed)
OB Note ?asynclitic on RN exam so patient laboring down in RL position with peanut ball. Pt comfortable. Fetus category I, toco q1-552m on pitocin. Will come and assess once patient starts pushing in a little bit. Has been complete since 1530.   Kelly Hensley, Jr MD [facility]ending [facility] Mid[facility](Faculty Practice)

## 2016-03-25 NOTE — Progress Notes (Signed)
Patient ID: Kelly Hensley, female   DOB: 12-28-1995, 20 y.o.   MRN: [id number] Kelly Hensley is a 20 y.o. G1P0000 at 5645w2d admitted for induction of labor due to Post dates. Due date 03/16/16. Per nurse, baby feels asynclitic, pushed x ~2730mins, caput +3, vtx +1 and not moving head much w/ pushing, so will labor down for now. Pt does not want to lay in exaggerated sims/ peanut b/c too uncomfortable  Subjective: Feeling some discomfort w/ uc's, wants epidural redosed  Objective: BP 139/74 mmHg  Pulse 92  Temp(Src) 99.4 F (37.4 C) (Oral)  Resp 18  Ht 5\' 7"  (1.702 m)  Wt 122.925 kg (271 lb)  BMI 42.43 kg/m2  SpO2 100%  LMP 06/10/2015 Total I/O In: -  Out: 1100 [Urine:1100]  FHT:  FHR: 155 bpm, variability: moderate,  accelerations:  Present,  decelerations:  Absent UC:   regular, every 1-3 minutes  SVE:   Dilation: 10 Effacement (%): 100 Station: +1 (caput +3) Exam by:: Craige CottaAmanda Loye, RN  Pitocin @ 10 mu/min  Labs: Lab Results  Component Value Date   WBC 7.4 03/24/2016   HGB 11.3* 03/24/2016   HCT 31.5* 03/24/2016   MCV 68.8* 03/24/2016   PLT 332 03/24/2016    Assessment / Plan: Induction of labor due to postdates,  progressing well on pitocin, laboring down for now  Labor: Progressing normally Fetal Wellbeing:  Category I Pain Control:  Epidural Pre-eclampsia: n/a I/D:  pcn for gbs+ Anticipated MOD:  NSVD  Marge DuncansBooker, Kelly Hensley CNM, WHNP-BC 03/25/2016, 5:02 PM

## 2016-03-26 DIAGNOSIS — D573 Sickle-cell trait: Secondary | ICD-10-CM

## 2016-03-26 DIAGNOSIS — Z87891 Personal history of nicotine dependence: Secondary | ICD-10-CM

## 2016-03-26 DIAGNOSIS — O99214 Obesity complicating childbirth: Secondary | ICD-10-CM

## 2016-03-26 DIAGNOSIS — O9902 Anemia complicating childbirth: Secondary | ICD-10-CM

## 2016-03-26 DIAGNOSIS — O480 Post-term pregnancy: Secondary | ICD-10-CM

## 2016-03-26 DIAGNOSIS — O99824 Streptococcus B carrier state complicating childbirth: Secondary | ICD-10-CM

## 2016-03-26 DIAGNOSIS — A630 Anogenital (venereal) warts: Secondary | ICD-10-CM

## 2016-03-26 DIAGNOSIS — O9832 Other infections with a predominantly sexual mode of transmission complicating childbirth: Secondary | ICD-10-CM

## 2016-03-26 MED ORDER — OXYCODONE-ACETAMINOPHEN 5-325 MG PO TABS
1.0000 | ORAL_TABLET | ORAL | Status: DC | PRN
  Administered 2016-03-26 – 2016-03-27 (×5): 1 via ORAL
  Filled 2016-03-26 (×5): qty 1

## 2016-03-26 NOTE — Anesthesia Postprocedure Evaluation (Signed)
Anesthesia Post Note  Patient: Kelly Hensley  Procedure(s) Performed: * No procedures listed *  Patient location during evaluation: Mother Baby Anesthesia Type: Epidural Level of consciousness: awake, awake and alert, oriented and patient cooperative Pain management: pain level controlled Vital Signs Assessment: post-procedure vital signs reviewed and stable Respiratory status: spontaneous breathing, nonlabored ventilation and respiratory function stable Cardiovascular status: stable Postop Assessment: no headache, no backache, patient able to bend at knees and no signs of nausea or vomiting Anesthetic complications: no     Last Vitals:  Filed Vitals:   03/25/16 2215 03/26/16 0210  BP: 138/70 112/56  Pulse: 100 59  Temp: 37.1 C 36.4 C  Resp: 18 18    Last Pain:  Filed Vitals:   03/26/16 0347  PainSc: 5    Pain Goal: Patients Stated Pain Goal: 5 (03/24/16 1523)               Stephan Nelis L

## 2016-03-26 NOTE — Progress Notes (Signed)
Post Partum Day 1 Subjective: no complaints, up ad lib and tolerating PO  Objective: Blood pressure 112/56, pulse 59, temperature 97.5 F (36.4 C), temperature source Oral, resp. rate 18, height 5\' 7"  (1.702 m), weight 271 lb (122.925 kg), last menstrual period 06/10/2015, SpO2 100 %, unknown if currently breastfeeding.  Physical Exam:  General: alert, cooperative, appears stated age and no distress Lochia: appropriate Uterine Fundus: firm Incision: n/a DVT Evaluation: No evidence of DVT seen on physical exam. Negative Homan's sign. No cords or calf tenderness.   Recent Labs  03/24/16 0815  HGB 11.3*  HCT 31.5*    Assessment/Plan: Plan for discharge tomorrow. Will remove foly cath this A.M.   LOS: 2 days   Wyvonnia DuskyMarie Sims Laday 03/26/2016, 9:53 AM

## 2016-03-26 NOTE — Lactation Note (Signed)
This note was copied from a baby's chart. Lactation Consultation Note Initial visit at 25 hours of age.  Mom reports good feedings and denies pain with latch.  Baby has been latched for this feeding 20 minutes and appears shallow.  Lc asked to [place] baby and work on IT trainerdeeper latch.  Nipple compressed on tip.  Hand expression done with colostrum noted.  Mom has large pendulous breast with large compressible areola and flat nipples.  Mom is using good pillow support in football hold.  Baby latched well with wide gape and sucking with stimulation.  Encouraged mom to hold baby close for a deeper latch.  Mom has WIC and will return to school in a few weeks.  Discussed options of pumping and WIC services.  Mom has a personal hand pump.  [facility] resources given and discussed.  Encouraged to feed with early cues on demand.  Early newborn behavior discussed.  Mom to call for assist as needed.     Patient Name: Kelly Hensley ONGEX'BToday's Date: 03/26/2016 Reason for consult: Initial assessment   Maternal Data Has patient been taught Hand Expression?: Yes Does the patient have breastfeeding experience prior to this delivery?: No  Feeding Feeding Type: Breast Fed Length of feed: 30 min (baby latched for 20 minutes prior to visit)  LATCH Score/Interventions Latch: Repeated attempts needed to sustain latch, nipple held in mouth throughout feeding, stimulation needed to elicit sucking reflex. Intervention(s): Adjust position;Assist with latch;Breast massage;Breast compression  Audible Swallowing: A few with stimulation Intervention(s): Skin to skin;Hand expression  Type of Nipple: Flat  Comfort (Breast/Nipple): Soft / non-tender     Hold (Positioning): Assistance needed to correctly position infant at breast and maintain latch. Intervention(s): Breastfeeding basics reviewed;Support Pillows;Position options;Skin to skin  LATCH Score: 6  Lactation Tools Discussed/Used WIC Program: Yes   Consult  Status Consult Status: Follow-up Date: 03/27/16 Follow-up type: In-patient    Kelly Hensley, Kelly Hensley 03/26/2016, 8:07 PM
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
## 2016-03-26 NOTE — Plan of Care (Signed)
Problem: Activity: Goal: Will verbalize the importance of balancing activity with adequate rest periods Outcome: Not Progressing Ambulation in hallway encouraged. Patient complains excessively of musculoskeletal pelvic pain, perineal pain, and uterine cramping. I repeatedly reassure her that this type of discomfort is WNL after a vaginal delivery.   Problem: Coping: Goal: Ability to cope will improve Outcome: Not Progressing See previous note.   Problem: Pain Management: Goal: General experience of comfort will improve and pain level will decrease Outcome: Not Progressing See previous note

## 2016-03-27 MED ORDER — IBUPROFEN 600 MG PO TABS: 600.0000 mg | ORAL_TABLET | Freq: Four times a day (QID) | ORAL | Status: DC

## 2016-03-27 MED ORDER — NORETHINDRONE 0.35 MG PO TABS: 1.0000 | ORAL_TABLET | Freq: Every day | ORAL | Status: DC

## 2016-03-27 NOTE — Progress Notes (Signed)
UR chart review completed.  

## 2016-03-27 NOTE — Lactation Note (Signed)
This note was copied from a baby's chart. Lactation Consultation Note  Mom reports that BF is going well but that her left nipple is sore.  She reports there is a crack at the nipple areolar juncture but [facility]  Assisted mother with deeper latch and showed her how to perform breast compressions to keep [name] well engaged. Many swallows were heard. Suggested mother use expressed breast milk to the SN. Will also request coconut oil for her to apply. Reviewed support groups and outpatient services. Patient Name: Kelly Hensley'UToday's Date: 03/27/2016 Reason for consult: Follow-up assessment   Maternal Data    Feeding Feeding Type: Breast Fed Length of feed: 15 min  LATCH Score/Interventions Latch: Repeated attempts needed to sustain latch, nipple held in mouth throughout feeding, stimulation needed to elicit sucking reflex.  Audible Swallowing: Spontaneous and intermittent (many with stimulation)  Type of Nipple: Everted at rest and after stimulation  Comfort (Breast/Nipple): Soft / non-tender     Hold (Positioning): Assistance needed to correctly position infant at breast and maintain latch.  LATCH Score: 8  Lactation Tools Discussed/Used     Consult Status Consult Status: Complete    Soyla DryerJoseph, Andrae Claunch 03/27/2016, 10:01 AM

## 2016-03-27 NOTE — Discharge Summary (Signed)
OB Discharge Summary  Patient Name: Kelly Hensley DOB: Apr 17, 1996 MRN: [id number]  Date of admission: 03/24/2016 Delivering MD: [place]   Date of discharge: 03/27/2016  Admitting diagnosis: 41wks, induction Intrauterine pregnancy: [redacted]w[redacted]d     Secondary diagnosis:Active Problems:   Normal labor  Additional problems:none     Discharge diagnosis: Term Pregnancy Delivered                                                                     Post partum procedures:none  Augmentation: none  Complications: None  Hospital course:  Onset of Labor With Vaginal Delivery     20 y.o. yo G1P1001 at [redacted]w[redacted]d was admitted in Active Labor on 03/24/2016. Patient had an uncomplicated labor course as follows:  Membrane Rupture Time/Date: 2:20 AM ,03/25/2016   Intrapartum Procedures: Episiotomy: None [1]                                         Lacerations:  2nd degree [3];Periurethral [8]  Patient had a delivery of a Viable infant. 03/25/2016  Information for the patient's newborn:  Arnella, Pralle [[id number]]  Delivery Method: Vaginal, Spontaneous Delivery (Filed from Delivery Summary)    Pateint had an uncomplicated postpartum course.  She is ambulating, tolerating a regular diet, passing flatus, and urinating well. Patient is discharged home in stable condition on 03/27/2016.    Physical exam  Filed Vitals:   03/25/16 2215 03/26/16 0210 03/26/16 1043 03/26/16 1811  BP: 138/70 112/56 120/65 110/62  Pulse: 100 59 84 84  Temp: 98.7 F (37.1 C) 97.5 F (36.4 C) 99 F (37.2 C) 98.2 F (36.8 C)  TempSrc: Oral Oral Oral Oral  Resp: Height:      Weight:      SpO2: 99% 100% 100% 100%   General: alert, cooperative and no distress Lochia: appropriate Uterine Fundus: firm Incision: N/A DVT Evaluation: No evidence of DVT seen on physical exam. Negative Homan's sign. No cords or calf tenderness. Labs: Lab Results  Component Value Date   WBC 7.4 03/24/2016   HGB 11.3*  03/24/2016   HCT 31.5* 03/24/2016   MCV 68.8* 03/24/2016   PLT 332 03/24/2016   CMP Latest Ref Rng 06/06/2015  Glucose 65 - 99 mg/dL 84  BUN 6 - 20 mg/dL 9  Creatinine 7.82 - 9.56 mg/dL 2.13  Sodium 086 - 578 mmol/L 140  Potassium 3.5 - 5.1 mmol/L 4.0  Chloride 101 - 111 mmol/L 107    Discharge instruction: per After Visit Summary and "Baby and Me Booklet".  After Visit Meds:    Medication List    ASK your doctor about these medications        ferrous sulfate 325 (65 FE) MG tablet  Take 325 mg by mouth at bedtime.     prenatal multivitamin Tabs tablet  Take 1 tablet by mouth at bedtime.        Diet: routine diet  Activity: Advance as tolerated. Pelvic rest for 6 weeks.   Outpatient follow up:6 weeks Follow up Appt:No future appointments. Follow up visit: No Follow-up on file.  Postpartum contraception: Progesterone only pills  Newborn Data: Live born female  Birth Weight: 7 lb 4.2 oz (3295 g) APGAR: 5, 8  Baby Feeding: Breast Disposition:home with mother   03/27/2016 Wyvonnia DuskyMarie Lawson, CNM

## 2016-05-24 ENCOUNTER — Inpatient Hospital Stay (HOSPITAL_COMMUNITY)
Admission: AD | Admit: 2016-05-24 | Discharge: 2016-05-24 | Disposition: A | Discharge status: Home / Self Care | Payer: Medicaid Other | Source: Ambulatory Visit | Attending: Obstetrics and Gynecology | Admitting: Obstetrics and Gynecology

## 2016-05-24 ENCOUNTER — Encounter (HOSPITAL_COMMUNITY): Payer: Self-pay | Admitting: *Deleted

## 2016-05-24 DIAGNOSIS — N760 Acute vaginitis: Secondary | ICD-10-CM | POA: Insufficient documentation

## 2016-05-24 DIAGNOSIS — Z711 Person with feared health complaint in whom no diagnosis is made: Secondary | ICD-10-CM

## 2016-05-24 DIAGNOSIS — A499 Bacterial infection, unspecified: Secondary | ICD-10-CM

## 2016-05-24 DIAGNOSIS — Z87891 Personal history of nicotine dependence: Secondary | ICD-10-CM | POA: Insufficient documentation

## 2016-05-24 DIAGNOSIS — D573 Sickle-cell trait: Secondary | ICD-10-CM | POA: Insufficient documentation

## 2016-05-24 DIAGNOSIS — N898 Other specified noninflammatory disorders of vagina: Secondary | ICD-10-CM | POA: Diagnosis not present

## 2016-05-24 DIAGNOSIS — Z79899 Other long term (current) drug therapy: Secondary | ICD-10-CM | POA: Insufficient documentation

## 2016-05-24 DIAGNOSIS — Z202 Contact with and (suspected) exposure to infections with a predominantly sexual mode of transmission: Secondary | ICD-10-CM

## 2016-05-24 DIAGNOSIS — B9689 Other specified bacterial agents as the cause of diseases classified elsewhere: Secondary | ICD-10-CM

## 2016-05-24 LAB — URINALYSIS, ROUTINE W REFLEX MICROSCOPIC
Bilirubin Urine: NEGATIVE
Glucose, UA: NEGATIVE mg/dL
KETONES UR: NEGATIVE mg/dL
NITRITE: NEGATIVE
PH: 6.5 (ref 5.0–8.0)
Protein, ur: NEGATIVE mg/dL
SPECIFIC GRAVITY, URINE: 1.02 (ref 1.005–1.030)

## 2016-05-24 LAB — WET PREP, GENITAL
Sperm: NONE SEEN
Trich, Wet Prep: NONE SEEN
YEAST WET PREP: NONE SEEN

## 2016-05-24 LAB — URINE MICROSCOPIC-ADD ON

## 2016-05-24 LAB — POCT PREGNANCY, URINE: Preg Test, Ur: NEGATIVE

## 2016-05-24 LAB — HEPATITIS B SURFACE ANTIGEN: Hepatitis B Surface Ag: NEGATIVE

## 2016-05-24 MED ORDER — METRONIDAZOLE 500 MG PO TABS: 500.0000 mg | ORAL_TABLET | Freq: Two times a day (BID) | ORAL | 0 refills | Status: DC

## 2016-05-24 MED ORDER — FERROUS SULFATE 325 (65 FE) MG PO TABS: 325.0000 mg | ORAL_TABLET | ORAL | 0 refills | Status: DC

## 2016-05-24 NOTE — MAU Provider Note (Signed)
Chief Complaint:  Abdominal Pain and Vaginal Discharge   First Provider Initiated Contact with Patient 05/24/16 1117      HPI: Kelly Hensley is a 20 y.o. G1P1001 who presents to maternity admissions reporting vaginal discharge with an odor and some  Cramping.  Just delivered almost 8 weeks ago. Never went for her postpartum appointment "because of my situation" (would not elaborate).  Wants blood tests and "everything" tested. Worried her partner is cheating on her.  She reports vaginal bleeding, urinary symptoms, h/a, dizziness, n/v, or fever/chills.   Not taking OCPs because she did not pick them up yet.  Does not want to become pregnant, but has not picked them up.  Does not want to reschedule PP appt.   Vaginal Discharge  The patient's primary symptoms include a genital odor, pelvic pain, vaginal bleeding and vaginal discharge. The patient's pertinent negatives include no genital itching, genital lesions, genital rash or missed menses. This is a new problem. The current episode started 1 to 4 weeks ago. The problem occurs constantly. The problem has been unchanged. The pain is mild. The problem affects both sides. She is not pregnant. Pertinent negatives include no back pain, chills, constipation, diarrhea, dysuria, fever, flank pain, headaches, nausea, rash or urgency. The vaginal discharge was milky. The vaginal bleeding is spotting. She has not been passing clots. She has not been passing tissue. Nothing aggravates the symptoms. She has tried nothing for the symptoms. She is sexually active. It is possible that her partner has an STD.   RN Note: Having pains in lower stomach.  Has an odor, not sure if it is bacterial or if she has something.  Past Medical History: Past Medical History:  Diagnosis Date  . Anemia   . Cellulitis and abscess of trunk   . HPV (human papilloma virus) infection   . Migraines   . Morbid obesity (HCC)   . Sickle cell trait (HCC)     Past obstetric history: OB  History  Gravida Para Term Preterm AB Living  1 1 1  0 0 1  SAB TAB Ectopic Multiple Live Births  0 0 0 0 1    # Outcome Date GA Lbr Len/2nd Weight Sex Delivery Anes PTL Lv  1 Term 03/25/16 [id number] 37:30 / 03:17 7 lb 4.2 oz (3.295 kg) F Vag-Spont EPI  LIV      Past Surgical History: Past Surgical History:  Procedure Laterality Date  . ABCESS DRAINAGE Left 2014    Family History: Family History  Problem Relation Age of Onset  . Diabetes Mother   . Sickle cell anemia Father     Social History: Social History  Substance Use Topics  . Smoking status: Former Smoker    Packs/day: 0.25    Types: Cigarettes    Quit date: 02/17/2015  . Smokeless tobacco: Never Used  . Alcohol use No    Allergies: No Known Allergies  Meds:  Prescriptions Prior to Admission  Medication Sig Dispense Refill Last Dose  . ferrous sulfate 325 (65 FE) MG tablet Take 325 mg by mouth at bedtime.   03/23/2016 at Unknown time  . ibuprofen (ADVIL,MOTRIN) 600 MG tablet Take 1 tablet (600 mg total) by mouth every 6 (six) hours. 30 tablet 0   . norethindrone (CAMILA) 0.35 MG tablet Take 1 tablet (0.35 mg total) by mouth daily. 1 Package 11   . Prenatal Vit-Fe Fumarate-FA (PRENATAL MULTIVITAMIN) TABS tablet Take 1 tablet by mouth at bedtime.   03/23/2016 at Unknown  time    I have reviewed patient's Past Medical Hx, Surgical Hx, Family Hx, Social Hx, medications and allergies.  ROS:  Review of Systems  Constitutional: Negative for chills and fever.  Gastrointestinal: Negative for constipation, diarrhea and nausea.  Genitourinary: Positive for pelvic pain and vaginal discharge. Negative for dysuria, flank pain, missed menses and urgency.  Musculoskeletal: Negative for back pain.  Skin: Negative for rash.  Neurological: Negative for headaches.   Other systems negative     Physical Exam  Patient Vitals for the past 24 hrs:  BP Temp Temp src Pulse Resp Weight  05/24/16 1108 118/66 98.4 F (36.9 C) Oral 67 18  275 lb (124.7 kg)   Constitutional: Well-developed, well-nourished female in no acute distress.  Cardiovascular: normal rate and rhythm, no ectopy audible, S1 & S2 heard, no murmur Respiratory: normal effort, no distress. Lungs CTAB with no wheezes or crackles GI: Abd soft, non-tender.  Nondistended.  No rebound, No guarding.  Bowel Sounds audible  MS: Extremities nontender, no edema, normal ROM Neurologic: Alert and oriented x 4.   Grossly nonfocal. GU: Neg CVAT. Skin:  Warm and Dry Psych:  Affect appropriate.  PELVIC EXAM: Cervix pink, visually closed, without lesion, scant white creamy discharge, vaginal walls and external genitalia normal Bimanual exam: Cervix firm, anterior, neg CMT, uterus  Mildly tender, nonenlarged, adnexa with mild tenderness, enlargement, or mass    Labs: Results for orders placed or performed during the hospital encounter of 05/24/16 (from the past 24 hour(s))  Urinalysis, Routine w reflex microscopic (not at [facility])     Status: Abnormal   Collection Time: 05/24/16 11:10 AM  Result Value Ref Range   Color, Urine YELLOW YELLOW   APPearance CLEAR CLEAR   Specific Gravity, Urine 1.020 1.005 - 1.030   pH 6.5 5.0 - 8.0   Glucose, UA NEGATIVE NEGATIVE mg/dL   Hgb urine dipstick MODERATE (A) NEGATIVE   Bilirubin Urine NEGATIVE NEGATIVE   Ketones, ur NEGATIVE NEGATIVE mg/dL   Protein, ur NEGATIVE NEGATIVE mg/dL   Nitrite NEGATIVE NEGATIVE   Leukocytes, UA TRACE (A) NEGATIVE  Urine microscopic-add on     Status: Abnormal   Collection Time: 05/24/16 11:10 AM  Result Value Ref Range   Squamous Epithelial / LPF 0-5 (A) NONE SEEN   WBC, UA 0-5 0 - 5 WBC/hpf   RBC / HPF 0-5 0 - 5 RBC/hpf   Bacteria, UA MANY (A) NONE SEEN   Urine-Other MUCOUS PRESENT   Pregnancy, urine POC     Status: None   Collection Time: 05/24/16 11:22 AM  Result Value Ref Range   Preg Test, Ur NEGATIVE NEGATIVE  Wet prep, genital     Status: Abnormal   Collection Time: 05/24/16 11:35 AM   Result Value Ref Range   Yeast Wet Prep HPF POC NONE SEEN NONE SEEN   Trich, Wet Prep NONE SEEN NONE SEEN   Clue Cells Wet Prep HPF POC PRESENT (A) NONE SEEN   WBC, Wet Prep HPF POC MODERATE (A) NONE SEEN   Sperm NONE SEEN     Imaging:  No results found.  MAU Course/MDM: I have ordered labs as follows:  GC/Chlamydia, wet prep, HIV, Hbsag, RPR Imaging ordered: none Results reviewed.   Discussed BV and rx provided for treatment. Advised we will call of other tests come back positive  Pt stable at time of discharge.  Assessment: Vaginal discharge Concerned about STD exposure Bacterial Vaginosis  Plan: Discharge home Recommend Safe sex practices Rx  sent for Flagyl for BV  Encouraged to return here or to other Urgent Care/ED if she develops worsening of symptoms, increase in pain, fever, or other concerning symptoms.   Wynelle Bourgeois CNM, MSN Certified Nurse-Midwife 05/24/2016 11:17 AM

## 2016-05-24 NOTE — MAU Note (Signed)
Having pains in lower stomach.  Has an odor, not sure if it is bacterial or if she has something.

## 2016-05-24 NOTE — Discharge Instructions (Signed)
Bacterial Vaginosis Bacterial vaginosis is a vaginal infection that occurs when the normal balance of bacteria in the vagina is disrupted. It results from an overgrowth of certain bacteria. This is the most common vaginal infection in women of childbearing age. Treatment is important to prevent complications, especially in pregnant women, as it can cause a premature delivery. CAUSES  Bacterial vaginosis is caused by an increase in harmful bacteria that are normally present in smaller amounts in the vagina. Several different kinds of bacteria can cause bacterial vaginosis. However, the reason that the condition develops is not fully understood. RISK FACTORS Certain activities or behaviors can put you at an increased risk of developing bacterial vaginosis, including:  Having a new sex partner or multiple sex partners.  Douching.  Using an intrauterine device (IUD) for contraception. Women do not get bacterial vaginosis from toilet seats, bedding, swimming pools, or contact with objects around them. SIGNS AND SYMPTOMS  Some women with bacterial vaginosis have no signs or symptoms. Common symptoms include:  Grey vaginal discharge.  A fishlike odor with discharge, especially after sexual intercourse.  Itching or burning of the vagina and vulva.  Burning or pain with urination. DIAGNOSIS  Your health care provider will take a medical history and examine the vagina for signs of bacterial vaginosis. A sample of vaginal fluid may be taken. Your health care provider will look at this sample under a microscope to check for bacteria and abnormal cells. A vaginal pH test may also be done.  TREATMENT  Bacterial vaginosis may be treated with antibiotic medicines. These may be given in the form of a pill or a vaginal cream. A second round of antibiotics may be prescribed if the condition comes back after treatment. Because bacterial vaginosis increases your risk for sexually transmitted diseases, getting  treated can help reduce your risk for chlamydia, gonorrhea, HIV, and herpes. HOME CARE INSTRUCTIONS   Only take over-the-counter or prescription medicines as directed by your health care provider.  If antibiotic medicine was prescribed, take it as directed. Make sure you finish it even if you start to feel better.  Tell all sexual partners that you have a vaginal infection. They should see their health care provider and be treated if they have problems, such as a mild rash or itching.  During treatment, it is important that you follow these instructions:  Avoid sexual activity or use condoms correctly.  Do not douche.  Avoid alcohol as directed by your health care provider.  Avoid breastfeeding as directed by your health care provider. SEEK MEDICAL CARE IF:   Your symptoms are not improving after 3 days of treatment.  You have increased discharge or pain.  You have a fever. MAKE SURE YOU:   Understand these instructions.  Will watch your condition.  Will get help right away if you are not doing well or get worse. FOR MORE INFORMATION  Centers for Disease Control and Prevention, Division of STD Prevention: SolutionApps.co.zawww.cdc.gov/std American Sexual Health Association (ASHA): www.ashastd.org    This information is not intended to replace advice given to you by your health care provider. Make sure you discuss any questions you have with your health care provider.   Document Released: 09/04/2005 Document Revised: 09/25/2014 Document Reviewed: 04/16/2013 Elsevier Interactive Patient Education 2016 Elsevier Inc. Oral Contraception Use Oral contraceptive pills (OCPs) are medicines taken to prevent pregnancy. OCPs work by preventing the ovaries from releasing eggs. The hormones in OCPs also cause the cervical mucus to thicken, preventing the sperm from  entering the uterus. The hormones also cause the uterine lining to become thin, not allowing a fertilized egg to attach to the inside of the  uterus. OCPs are highly effective when taken exactly as prescribed. However, OCPs do not prevent sexually transmitted diseases (STDs). Safe sex practices, such as using condoms along with an OCP, can help prevent STDs. Before taking OCPs, you may have a physical exam and Pap test. Your health care provider may also order blood tests if necessary. Your health care provider will make sure you are a good candidate for oral contraception. Discuss with your health care provider the possible side effects of the OCP you may be prescribed. When starting an OCP, it can take 2 to 3 months for the body to adjust to the changes in hormone levels in your body.  HOW TO TAKE ORAL CONTRACEPTIVE PILLS Your health care provider may advise you on how to start taking the first cycle of OCPs. Otherwise, you can:   Start on day 1 of your menstrual period. You will not need any backup contraceptive protection with this start time.   Start on the first Sunday after your menstrual period or the day you get your prescription. In these cases, you will need to use backup contraceptive protection for the first week.   Start the pill at any time of your cycle. If you take the pill within 5 days of the start of your period, you are protected against pregnancy right away. In this case, you will not need a backup form of birth control. If you start at any other time of your menstrual cycle, you will need to use another form of birth control for 7 days. If your OCP is the type called a minipill, it will protect you from pregnancy after taking it for 2 days (48 hours). After you have started taking OCPs:   If you forget to take 1 pill, take it as soon as you remember. Take the next pill at the regular time.   If you miss 2 or more pills, call your health care provider because different pills have different instructions for missed doses. Use backup birth control until your next menstrual period starts.   If you use a 28-day pack  that contains inactive pills and you miss 1 of the last 7 pills (pills with no hormones), it will not matter. Throw away the rest of the non-hormone pills and start a new pill pack.  No matter which day you start the OCP, you will always start a new pack on that same day of the week. Have an extra pack of OCPs and a backup contraceptive method available in case you miss some pills or lose your OCP pack.  HOME CARE INSTRUCTIONS   Do not smoke.   Always use a condom to protect against STDs. OCPs do not protect against STDs.   Use a calendar to mark your menstrual period days.   Read the information and directions that came with your OCP. Talk to your health care provider if you have questions.  SEEK MEDICAL CARE IF:   You develop nausea and vomiting.   You have abnormal vaginal discharge or bleeding.   You develop a rash.   You miss your menstrual period.   You are losing your hair.   You need treatment for mood swings or depression.   You get dizzy when taking the OCP.   You develop acne from taking the OCP.   You become pregnant.  SEEK IMMEDIATE MEDICAL CARE IF:   You develop chest pain.   You develop shortness of breath.   You have an uncontrolled or severe headache.   You develop numbness or slurred speech.   You develop visual problems.   You develop pain, redness, and swelling in the legs.    This information is not intended to replace advice given to you by your health care provider. Make sure you discuss any questions you have with your health care provider.   Document Released: 08/24/2011 Document Revised: 09/25/2014 Document Reviewed: 02/23/2013 Elsevier Interactive Patient Education 2016 [employer]. Safe Sex Safe sex is about reducing the risk of giving or getting a sexually transmitted disease (STD). STDs are spread through sexual contact involving the genitals, mouth, or rectum. Some STDs can be cured and others cannot. Safe sex can  also prevent unintended pregnancies.  WHAT ARE SOME SAFE SEX PRACTICES?  Limit your sexual activity to only one partner who is having sex with only you.  Talk to your partner about his or her past partners, past STDs, and drug use.  Use a condom every time you have sexual intercourse. This includes vaginal, oral, and anal sexual activity. Both females and males should wear condoms during oral sex. Only use latex or polyurethane condoms and water-based lubricants. Using petroleum-based lubricants or oils to lubricate a condom will weaken the condom and increase the chance that it will break. The condom should be in place from the beginning to the end of sexual activity. Wearing a condom reduces, but does not completely eliminate, your risk of getting or giving an STD. STDs can be spread by contact with infected body fluids and skin.  Get vaccinated for hepatitis B and HPV.  Avoid alcohol and recreational drugs, which can affect your judgment. You may forget to use a condom or participate in high-risk sex.  For females, avoid douching after sexual intercourse. Douching can spread an infection farther into the reproductive tract.  Check your body for signs of sores, blisters, rashes, or unusual discharge. See your health care provider if you notice any of these signs.  Avoid sexual contact if you have symptoms of an infection or are being treated for an STD. If you or your partner has herpes, avoid sexual contact when blisters are present. Use condoms at all other times.  If you are at risk of being infected with HIV, it is recommended that you take a prescription medicine daily to prevent HIV infection. This is called pre-exposure prophylaxis (PrEP). You are considered at risk if:  You are a man who has sex with other men (MSM).  You are a heterosexual man or woman who is sexually active with more than one partner.  You take drugs by injection.  You are sexually active with a partner who has  HIV.  Talk with your health care provider about whether you are at high risk of being infected with HIV. If you choose to begin PrEP, you should first be tested for HIV. You should then be tested every 3 months for as long as you are taking PrEP.  See your health care provider for regular screenings, exams, and tests for other STDs. Before having sex with a new partner, each of you should be screened for STDs and should talk about the results with each other. WHAT ARE THE BENEFITS OF SAFE SEX?   There is less chance of getting or giving an STD.  You can prevent unwanted or unintended pregnancies.  By discussing safe sex concerns with your partner, you may increase feelings of intimacy, comfort, trust, and honesty between the two of you.   This information is not intended to replace advice given to you by your health care provider. Make sure you discuss any questions you have with your health care provider.   Document Released: 10/12/2004 Document Revised: 09/25/2014 Document Reviewed: 02/26/2012 Elsevier Interactive Patient Education [employer]

## 2016-05-25 LAB — GC/CHLAMYDIA PROBE AMP (~~LOC~~) NOT AT ARMC
CHLAMYDIA, DNA PROBE: NEGATIVE
NEISSERIA GONORRHEA: NEGATIVE

## 2016-05-25 LAB — RPR: RPR: NONREACTIVE

## 2016-05-25 LAB — HIV ANTIBODY (ROUTINE TESTING W REFLEX): HIV Screen 4th Generation wRfx: NONREACTIVE

## 2016-06-08 ENCOUNTER — Encounter: Payer: Self-pay | Admitting: *Deleted

## 2016-07-08 ENCOUNTER — Emergency Department (HOSPITAL_COMMUNITY): Payer: Medicaid Other

## 2016-07-08 ENCOUNTER — Encounter (HOSPITAL_COMMUNITY): Payer: Self-pay | Admitting: Emergency Medicine

## 2016-07-08 ENCOUNTER — Emergency Department (HOSPITAL_COMMUNITY)
Admission: EM | Admit: 2016-07-08 | Discharge: 2016-07-09 | Disposition: A | Discharge status: Home / Self Care | Payer: Medicaid Other | Attending: Emergency Medicine | Admitting: Emergency Medicine

## 2016-07-08 DIAGNOSIS — F17210 Nicotine dependence, cigarettes, uncomplicated: Secondary | ICD-10-CM | POA: Diagnosis not present

## 2016-07-08 DIAGNOSIS — R102 Pelvic and perineal pain: Secondary | ICD-10-CM

## 2016-07-08 DIAGNOSIS — R1030 Lower abdominal pain, unspecified: Secondary | ICD-10-CM

## 2016-07-08 DIAGNOSIS — N939 Abnormal uterine and vaginal bleeding, unspecified: Secondary | ICD-10-CM | POA: Diagnosis not present

## 2016-07-08 LAB — URINALYSIS, ROUTINE W REFLEX MICROSCOPIC
BILIRUBIN URINE: NEGATIVE
GLUCOSE, UA: NEGATIVE mg/dL
HGB URINE DIPSTICK: NEGATIVE
KETONES UR: NEGATIVE mg/dL
Leukocytes, UA: NEGATIVE
NITRITE: NEGATIVE
PH: 6 (ref 5.0–8.0)
Protein, ur: NEGATIVE mg/dL
SPECIFIC GRAVITY, URINE: 1.023 (ref 1.005–1.030)

## 2016-07-08 LAB — CBC
HEMATOCRIT: 36.4 % (ref 36.0–46.0)
HEMOGLOBIN: 12.9 g/dL (ref 12.0–15.0)
MCH: 25.1 pg — AB (ref 26.0–34.0)
MCHC: 35.4 g/dL (ref 30.0–36.0)
MCV: 71 fL — AB (ref 78.0–100.0)
Platelets: 384 10*3/uL (ref 150–400)
RBC: 5.13 MIL/uL — ABNORMAL HIGH (ref 3.87–5.11)
RDW: 14.4 % (ref 11.5–15.5)
WBC: 5.8 10*3/uL (ref 4.0–10.5)

## 2016-07-08 LAB — COMPREHENSIVE METABOLIC PANEL
ALBUMIN: 4 g/dL (ref 3.5–5.0)
ALT: 18 U/L (ref 14–54)
ANION GAP: 7 (ref 5–15)
AST: 18 U/L (ref 15–41)
Alkaline Phosphatase: 77 U/L (ref 38–126)
BILIRUBIN TOTAL: 0.5 mg/dL (ref 0.3–1.2)
BUN: 10 mg/dL (ref 6–20)
CHLORIDE: 106 mmol/L (ref 101–111)
CO2: 24 mmol/L (ref 22–32)
Calcium: 9.6 mg/dL (ref 8.9–10.3)
Creatinine, Ser: 0.73 mg/dL (ref 0.44–1.00)
GFR calc Af Amer: >60 mL/min (ref >60)
GFR calc non Af Amer: >60 mL/min (ref >60)
GLUCOSE: 107 mg/dL — AB (ref 65–99)
POTASSIUM: 3.8 mmol/L (ref 3.5–5.1)
SODIUM: 137 mmol/L (ref 135–145)
Total Protein: 7.3 g/dL (ref 6.5–8.1)

## 2016-07-08 LAB — I-STAT BETA HCG BLOOD, ED (MC, WL, AP ONLY): I-stat hCG, quantitative: <5 m[IU]/mL (ref <5)

## 2016-07-08 LAB — LIPASE, BLOOD: Lipase: 25 U/L (ref 11–51)

## 2016-07-08 MED ORDER — IBUPROFEN 400 MG PO TABS
600.0000 mg | ORAL_TABLET | Freq: Once | ORAL | Status: AC
  Administered 2016-07-08: 600 mg via ORAL
  Filled 2016-07-08: qty 1

## 2016-07-08 MED ORDER — ACETAMINOPHEN 500 MG PO TABS
1000.0000 mg | ORAL_TABLET | Freq: Once | ORAL | Status: AC
  Administered 2016-07-09: 1000 mg via ORAL
  Filled 2016-07-08: qty 2

## 2016-07-08 NOTE — ED Notes (Signed)
Taken to US at this time. 

## 2016-07-08 NOTE — ED Notes (Signed)
Spoke with US.  Doing pelvic ultrasound at this time.

## 2016-07-08 NOTE — ED Triage Notes (Signed)
Pt presents to ED for assessment of lower abdominal pain.  Pt gave vaginal birth x 4 months ago and sts this pain feels the same as when she was in labor.  Pt c/o radiation to back.  Pain has been x 3 days worsening and constant.  Pt has not tried anything at home.  Pt c/o nausea, denies vomiting or diarrhea.
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
## 2016-07-08 NOTE — Discharge Instructions (Signed)
If you were given medicines take as directed.  If you are on coumadin or contraceptives realize their levels and effectiveness is altered by many different medicines.  If you have any reaction (rash, tongues swelling, other) to the medicines stop taking and see a physician.    If your blood pressure was elevated in the ER make sure you follow up for management with a primary doctor or return for chest pain, shortness of breath or stroke symptoms.  Please follow up as directed and return to the ER or see a physician for new or worsening symptoms.  Thank you. Vitals:   07/08/16 1837  BP: 127/88  Pulse: 77  Resp: 20  Temp: 98.3 F (36.8 C)  TempSrc: Oral  SpO2: 100%

## 2016-07-09 MED ORDER — IBUPROFEN 600 MG PO TABS: 600.0000 mg | ORAL_TABLET | Freq: Four times a day (QID) | ORAL | 1 refills | Status: DC

## 2016-07-10 LAB — GC/CHLAMYDIA PROBE AMP (~~LOC~~) NOT AT ARMC
CHLAMYDIA, DNA PROBE: NEGATIVE
NEISSERIA GONORRHEA: NEGATIVE

## 2016-09-18 NOTE — L&D Delivery Note (Signed)
Allyne Geeniya D Ortmann is a 21 y.o. female G2P1001 at 4023w5d by LMP that presented for IOL with pitocin and AROM 2/2 BPP 6/10 (off for breathing and tone). She plans on breast feeding. She requests Nexplanon for birth control.  Delivery Note At 2:04 AM a viable, vigorous female was delivered via Vaginal, Spontaneous Delivery (Presentation: OA).  APGAR: 8, 9; weight  pending.   Placenta status: Intact.  Cord: 3v with the following complications: None.  Cord pH: Not obtained  Anesthesia:  Epidural Episiotomy:  N/A Lacerations: 1x first degree requiring no intervention Suture Repair: None Est. Blood Loss (mL):  100mL  Mom to postpartum.  Baby to Couplet care / Skin to Skin.  Conard NovakJoshua A Christian 04/12/2017, 2:20 AM  Patient is a G2P1001 at 2572w6d who was admitted for IOL due to BPP 6/10, but otherwise uncomplicated prenatal course.  She progressed with augmentation via Pitocin/AROM.  I was gloved and present for delivery in its entirety.  Second stage of labor progressed, baby delivered after a couple contractions.  Mild decels during second stage noted.  Complications: none  Lacerations: superficial 1st degree perineal- no repair  EBL: 100cc  Cam HaiSHAW, Breydon Senters, CNM 4:57 AM 04/12/2017

## 2016-11-24 ENCOUNTER — Emergency Department (HOSPITAL_COMMUNITY)
Admission: EM | Admit: 2016-11-24 | Discharge: 2016-11-24 | Disposition: A | Discharge status: Home / Self Care | Payer: Medicaid Other | Attending: Emergency Medicine | Admitting: Emergency Medicine

## 2016-11-24 ENCOUNTER — Emergency Department (HOSPITAL_COMMUNITY): Payer: Medicaid Other

## 2016-11-24 ENCOUNTER — Encounter (HOSPITAL_COMMUNITY): Payer: Self-pay | Admitting: Emergency Medicine

## 2016-11-24 DIAGNOSIS — F17210 Nicotine dependence, cigarettes, uncomplicated: Secondary | ICD-10-CM | POA: Insufficient documentation

## 2016-11-24 DIAGNOSIS — O26892 Other specified pregnancy related conditions, second trimester: Secondary | ICD-10-CM | POA: Diagnosis not present

## 2016-11-24 DIAGNOSIS — R1084 Generalized abdominal pain: Secondary | ICD-10-CM | POA: Diagnosis not present

## 2016-11-24 DIAGNOSIS — R109 Unspecified abdominal pain: Secondary | ICD-10-CM

## 2016-11-24 DIAGNOSIS — Z79899 Other long term (current) drug therapy: Secondary | ICD-10-CM | POA: Diagnosis not present

## 2016-11-24 LAB — URINALYSIS, ROUTINE W REFLEX MICROSCOPIC
BILIRUBIN URINE: NEGATIVE
Glucose, UA: NEGATIVE mg/dL
HGB URINE DIPSTICK: NEGATIVE
KETONES UR: 20 mg/dL — AB
Leukocytes, UA: NEGATIVE
NITRITE: NEGATIVE
Protein, ur: NEGATIVE mg/dL
SPECIFIC GRAVITY, URINE: 1.023 (ref 1.005–1.030)
pH: 7 (ref 5.0–8.0)

## 2016-11-24 LAB — COMPREHENSIVE METABOLIC PANEL
ALT: 15 U/L (ref 14–54)
ANION GAP: 6 (ref 5–15)
AST: 11 U/L — ABNORMAL LOW (ref 15–41)
Albumin: 3.5 g/dL (ref 3.5–5.0)
Alkaline Phosphatase: 60 U/L (ref 38–126)
BUN: 9 mg/dL (ref 6–20)
CHLORIDE: 106 mmol/L (ref 101–111)
CO2: 23 mmol/L (ref 22–32)
Calcium: 9.1 mg/dL (ref 8.9–10.3)
Creatinine, Ser: 0.56 mg/dL (ref 0.44–1.00)
GFR calc non Af Amer: >60 mL/min (ref >60)
Glucose, Bld: 97 mg/dL (ref 65–99)
POTASSIUM: 3.8 mmol/L (ref 3.5–5.1)
SODIUM: 135 mmol/L (ref 135–145)
Total Bilirubin: 0.3 mg/dL (ref 0.3–1.2)
Total Protein: 7.6 g/dL (ref 6.5–8.1)

## 2016-11-24 LAB — CBC
HCT: 32.8 % — ABNORMAL LOW (ref 36.0–46.0)
Hemoglobin: 11.6 g/dL — ABNORMAL LOW (ref 12.0–15.0)
MCH: 25.2 pg — AB (ref 26.0–34.0)
MCHC: 35.4 g/dL (ref 30.0–36.0)
MCV: 71.1 fL — ABNORMAL LOW (ref 78.0–100.0)
PLATELETS: 306 10*3/uL (ref 150–400)
RBC: 4.61 MIL/uL (ref 3.87–5.11)
RDW: 13.9 % (ref 11.5–15.5)
WBC: 8.4 10*3/uL (ref 4.0–10.5)

## 2016-11-24 LAB — WET PREP, GENITAL
Clue Cells Wet Prep HPF POC: NONE SEEN
Sperm: NONE SEEN
TRICH WET PREP: NONE SEEN
YEAST WET PREP: NONE SEEN

## 2016-11-24 LAB — LIPASE, BLOOD: LIPASE: 17 U/L (ref 11–51)

## 2016-11-24 MED ORDER — ACETAMINOPHEN 325 MG PO TABS
650.0000 mg | ORAL_TABLET | Freq: Once | ORAL | Status: AC
  Administered 2016-11-24: 650 mg via ORAL
  Filled 2016-11-24: qty 2

## 2016-11-24 MED ORDER — SODIUM CHLORIDE 0.9 % IV BOLUS (SEPSIS)
500.0000 mL | Freq: Once | INTRAVENOUS | Status: AC
  Administered 2016-11-24: 500 mL via INTRAVENOUS

## 2016-11-24 NOTE — ED Triage Notes (Signed)
Patient reports that she is almost 6 months pregnant. Patient states she has been having abdominal pain x 2 months. Nausea/vomiting x6 months Denies vaginal bleeding. Reports white vaginal discharge.
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
## 2016-11-24 NOTE — ED Notes (Signed)
ED Provider at bedside. EDP REES AT BEDSIDE WITH ULTRASOUND

## 2016-11-24 NOTE — ED Notes (Signed)
PELVIC SET UP

## 2016-11-24 NOTE — ED Notes (Signed)
ED Provider at bedside. 

## 2016-11-24 NOTE — Progress Notes (Signed)
Pt is a G2P1 at [redacted] weeks gestation here with c/o lower back pain. No vaginal bleeding or leaking of fluid. No PNC.

## 2016-11-24 NOTE — ED Notes (Signed)
RAPID OBGYN AT BEDSIDE. EDP REES UPDATED RN

## 2016-11-24 NOTE — Progress Notes (Signed)
Spoke with Dr.Arnold. Pt is a G2P1 at [redacted] weeks gestation here with c/o back pain and some vaginal d/c. FHR 155 BPM by doppler. No vaginal bleeding or leaking of clear fluid. ED provider plans to obtain cultures and blood work. Pt is OB cleared. [facility].

## 2016-11-25 LAB — HIV ANTIBODY (ROUTINE TESTING W REFLEX): HIV Screen 4th Generation wRfx: NONREACTIVE

## 2016-11-25 LAB — RPR: RPR: NONREACTIVE

## 2016-11-27 LAB — GC/CHLAMYDIA PROBE AMP (~~LOC~~) NOT AT ARMC
Chlamydia: NEGATIVE
Neisseria Gonorrhea: NEGATIVE

## 2016-12-30 ENCOUNTER — Inpatient Hospital Stay (HOSPITAL_COMMUNITY)
Admission: AD | Admit: 2016-12-30 | Discharge: 2016-12-30 | Disposition: A | Discharge status: Home / Self Care | Payer: Medicaid Other | Source: Ambulatory Visit | Attending: Obstetrics & Gynecology | Admitting: Obstetrics & Gynecology

## 2016-12-30 ENCOUNTER — Encounter (HOSPITAL_COMMUNITY): Payer: Self-pay

## 2016-12-30 DIAGNOSIS — N898 Other specified noninflammatory disorders of vagina: Secondary | ICD-10-CM | POA: Diagnosis present

## 2016-12-30 DIAGNOSIS — O99012 Anemia complicating pregnancy, second trimester: Secondary | ICD-10-CM | POA: Diagnosis not present

## 2016-12-30 DIAGNOSIS — O99332 Smoking (tobacco) complicating pregnancy, second trimester: Secondary | ICD-10-CM | POA: Diagnosis not present

## 2016-12-30 DIAGNOSIS — O99212 Obesity complicating pregnancy, second trimester: Secondary | ICD-10-CM | POA: Diagnosis not present

## 2016-12-30 DIAGNOSIS — Z3A26 26 weeks gestation of pregnancy: Secondary | ICD-10-CM | POA: Diagnosis not present

## 2016-12-30 DIAGNOSIS — O26892 Other specified pregnancy related conditions, second trimester: Secondary | ICD-10-CM | POA: Insufficient documentation

## 2016-12-30 DIAGNOSIS — N760 Acute vaginitis: Secondary | ICD-10-CM | POA: Diagnosis not present

## 2016-12-30 DIAGNOSIS — Z833 Family history of diabetes mellitus: Secondary | ICD-10-CM | POA: Insufficient documentation

## 2016-12-30 DIAGNOSIS — F17210 Nicotine dependence, cigarettes, uncomplicated: Secondary | ICD-10-CM | POA: Diagnosis not present

## 2016-12-30 DIAGNOSIS — B9689 Other specified bacterial agents as the cause of diseases classified elsewhere: Secondary | ICD-10-CM | POA: Diagnosis not present

## 2016-12-30 DIAGNOSIS — Z68.41 Body mass index (BMI) pediatric, greater than or equal to 95th percentile for age: Secondary | ICD-10-CM | POA: Diagnosis not present

## 2016-12-30 DIAGNOSIS — Z832 Family history of diseases of the blood and blood-forming organs and certain disorders involving the immune mechanism: Secondary | ICD-10-CM | POA: Diagnosis not present

## 2016-12-30 DIAGNOSIS — D573 Sickle-cell trait: Secondary | ICD-10-CM | POA: Diagnosis not present

## 2016-12-30 DIAGNOSIS — B373 Candidiasis of vulva and vagina: Secondary | ICD-10-CM

## 2016-12-30 DIAGNOSIS — B3731 Acute candidiasis of vulva and vagina: Secondary | ICD-10-CM

## 2016-12-30 LAB — URINALYSIS, ROUTINE W REFLEX MICROSCOPIC
BILIRUBIN URINE: NEGATIVE
Glucose, UA: NEGATIVE mg/dL
HGB URINE DIPSTICK: NEGATIVE
Ketones, ur: 20 mg/dL — AB
Nitrite: NEGATIVE
Protein, ur: 30 mg/dL — AB
SPECIFIC GRAVITY, URINE: 1.018 (ref 1.005–1.030)
pH: 6 (ref 5.0–8.0)

## 2016-12-30 LAB — WET PREP, GENITAL
Clue Cells Wet Prep HPF POC: NONE SEEN
SPERM: NONE SEEN
Trich, Wet Prep: NONE SEEN
Yeast Wet Prep HPF POC: NONE SEEN

## 2016-12-30 MED ORDER — FLUCONAZOLE 200 MG PO TABS: 200.0000 mg | ORAL_TABLET | Freq: Every day | ORAL | 0 refills | Status: AC

## 2016-12-30 MED ORDER — METRONIDAZOLE 500 MG PO TABS: 500.0000 mg | ORAL_TABLET | Freq: Two times a day (BID) | ORAL | 0 refills | Status: DC

## 2016-12-30 NOTE — MAU Provider Note (Signed)
History   G2P1001 @ 26.1 wks in with vaginal itching and burning for past 3 days. Copious yellowish green discharge. DEnies ROM or vag bleeding.  CSN: [id number]  Arrival date & time 12/30/16  1322   None     Chief Complaint  Patient presents with  . Vaginal Discharge  . Vaginal Itching    HPI  Past Medical History:  Diagnosis Date  . Anemia   . Cellulitis and abscess of trunk   . HPV (human papilloma virus) infection   . Migraines   . Morbid obesity (HCC)   . Sickle cell trait [facility])     Past Surgical History:  Procedure Laterality Date  . ABCESS DRAINAGE Left 2014    Family History  Problem Relation Age of Onset  . Diabetes Mother   . Sickle cell anemia Father     Social History  Substance Use Topics  . Smoking status: Current Every Day Smoker    Packs/day: 0.25    Types: Cigarettes    Last attempt to quit: 02/17/2015  . Smokeless tobacco: Never Used  . Alcohol use No    OB History    Gravida Para Term Preterm AB Living   0 0 1   SAB TAB Ectopic Multiple Live Births   0 0 0 0 1      Review of Systems  Constitutional: Negative.   HENT: Negative.   Eyes: Negative.   Respiratory: Negative.   Cardiovascular: Negative.   Gastrointestinal: Negative.   Endocrine: Negative.   Genitourinary: Positive for vaginal discharge.  Musculoskeletal: Negative.   Skin: Negative.     Allergies  Patient has no known allergies.  Home Medications    BP (!) 118/57 (BP Location: Right Arm)   Pulse (!) 106   Temp 98.6 F (37 C) (Oral)   Resp 16   Ht  (1.702 m)   Wt 262 lb 0.6 oz (118.9 kg)   LMP 05/12/2016   BMI 41.04 kg/m   Physical Exam  Constitutional: She is oriented to person, place, and time. She appears well-developed and well-nourished.  HENT:  Head: Normocephalic.  Eyes: Pupils are equal, round, and reactive to light.  Neck: Normal range of motion.  Cardiovascular: Normal rate, regular rhythm, normal heart sounds and intact distal  pulses.   Pulmonary/Chest: Effort normal and breath sounds normal.  Abdominal: Soft. Bowel sounds are normal.  Genitourinary: Vaginal discharge found.  Musculoskeletal: Normal range of motion.  Neurological: She is alert and oriented to person, place, and time. She has normal reflexes.  Skin: Skin is warm and dry.  Psychiatric: She has a normal mood and affect. Her behavior is normal. Judgment and thought content normal.    MAU Course  Procedures (including critical care time)  Labs Reviewed  WET PREP, GENITAL  URINALYSIS, ROUTINE W REFLEX MICROSCOPIC  GC/CHLAMYDIA PROBE AMP ([place]) NOT AT [facility]   No results found.   No diagnosis found.  BV  yeast  MDM  G2P1001 @ 26.1 wks in with vaginal itching and burning for past 3 days. Copious yellowish green discharge. Wet prep, GC and chla obtained. Pt has copious amt of greenish yellow discharge. Repeat wet prep show yeast and clue cells. Will treat and d/c home.

## 2016-12-30 NOTE — Discharge Instructions (Signed)
Bacterial Vaginosis Bacterial vaginosis is an infection of the vagina. It happens when too many germs (bacteria) grow in the vagina. This infection puts you at risk for infections from sex (STIs). Treating this infection can lower your risk for some STIs. You should also treat this if you are pregnant. It can cause your baby to be born early. Follow these instructions at home: Medicines   Take over-the-counter and prescription medicines only as told by your doctor.  Take or use your antibiotic medicine as told by your doctor. Do not stop taking or using it even if you start to feel better. General instructions   If you your sexual partner is a woman, tell her that you have this infection. She needs to get treatment if she has symptoms. If you have a female partner, he does not need to be treated.  During treatment:  Avoid sex.  Do not douche.  Avoid alcohol as told.  Avoid breastfeeding as told.  Drink enough fluid to keep your pee (urine) clear or pale yellow.  Keep your vagina and butt (rectum) clean.  Wash the area with warm water every day.  Wipe from front to back after you use the toilet.  Keep all follow-up visits as told by your doctor. This is important. Preventing this condition   Do not douche.  Use only warm water to wash around your vagina.  Use protection when you have sex. This includes:  Latex condoms.  Dental dams.  Limit how many people you have sex with. It is best to only have sex with the same person (be monogamous).  Get tested for STIs. Have your partner get tested.  Wear underwear that is cotton or lined with cotton.  Avoid tight pants and pantyhose. This is most important in summer.  Do not use any products that have nicotine or tobacco in them. These include cigarettes and e-cigarettes. If you need help quitting, ask your doctor.  Do not use illegal drugs.  Limit how much alcohol you drink. Contact a doctor if:  Your symptoms do not  get better, even after you are treated.  You have more discharge or pain when you pee (urinate).  You have a fever.  You have pain in your belly (abdomen).  You have pain with sex.  Your bleed from your vagina between periods. Summary  This infection happens when too many germs (bacteria) grow in the vagina.  Treating this condition can lower your risk for some infections from sex (STIs).  You should also treat this if you are pregnant. It can cause early (premature) birth.  Do not stop taking or using your antibiotic medicine even if you start to feel better. This information is not intended to replace advice given to you by your health care provider. Make sure you discuss any questions you have with your health care provider. Document Released: 06/13/2008 Document Revised: 05/20/2016 Document Reviewed: 05/20/2016 Elsevier Interactive Patient Education  2017 Elsevier Inc. Vaginal Yeast infection, Adult Vaginal yeast infection is a condition that causes soreness, swelling, and redness (inflammation) of the vagina. It also causes vaginal discharge. This is a common condition. Some women get this infection frequently. What are the causes? This condition is caused by a change in the normal balance of the yeast (candida) and bacteria that live in the vagina. This change causes an overgrowth of yeast, which causes the inflammation. What increases the risk? This condition is more likely to develop in:  Women who take antibiotic medicines.    Women who have diabetes.  Women who take birth control pills.  Women who are pregnant.  Women who douche often.  Women who have a weak defense (immune) system.  Women who have been taking steroid medicines for a long time.  Women who frequently wear tight clothing. What are the signs or symptoms? Symptoms of this condition include:  White, thick vaginal discharge.  Swelling, itching, redness, and irritation of the vagina. The lips of  the vagina (vulva) may be affected as well.  Pain or a burning feeling while urinating.  Pain during sex. How is this diagnosed? This condition is diagnosed with a medical history and physical exam. This will include a pelvic exam. Your health care provider will examine a sample of your vaginal discharge under a microscope. Your health care provider may send this sample for testing to confirm the diagnosis. How is this treated? This condition is treated with medicine. Medicines may be over-the-counter or prescription. You may be told to use one or more of the following:  Medicine that is taken orally.  Medicine that is applied as a cream.  Medicine that is inserted directly into the vagina (suppository). Follow these instructions at home:  Take or apply over-the-counter and prescription medicines only as told by your health care provider.  Do not have sex until your health care provider has approved. Tell your sex partner that you have a yeast infection. That person should go to his or her health care provider if he or she develops symptoms.  Do not wear tight clothes, such as pantyhose or tight pants.  Avoid using tampons until your health care provider approves.  Eat more yogurt. This may help to keep your yeast infection from returning.  Try taking a sitz bath to help with discomfort. This is a warm water bath that is taken while you are sitting down. The water should only come up to your hips and should cover your buttocks. Do this 3-4 times per day or as told by your health care provider.  Do not douche.  Wear breathable, cotton underwear.  If you have diabetes, keep your blood sugar levels under control. Contact a health care provider if:  You have a fever.  Your symptoms go away and then return.  Your symptoms do not get better with treatment.  Your symptoms get worse.  You have new symptoms.  You develop blisters in or around your vagina.  You have blood coming  from your vagina and it is not your menstrual period.  You develop pain in your abdomen. This information is not intended to replace advice given to you by your health care provider. Make sure you discuss any questions you have with your health care provider. Document Released: 06/14/2005 Document Revised: 02/16/2016 Document Reviewed: 03/08/2015 Elsevier Interactive Patient Education  2017 Elsevier Inc.  

## 2016-12-30 NOTE — MAU Note (Signed)
Patient presents with what she thinks is a yeast infection, white discharge with vaginal itching.

## 2017-01-01 LAB — GC/CHLAMYDIA PROBE AMP (~~LOC~~) NOT AT ARMC
Chlamydia: NEGATIVE
Neisseria Gonorrhea: NEGATIVE

## 2017-01-12 ENCOUNTER — Encounter (HOSPITAL_COMMUNITY): Payer: Self-pay

## 2017-01-12 ENCOUNTER — Inpatient Hospital Stay (HOSPITAL_COMMUNITY)
Admission: AD | Admit: 2017-01-12 | Discharge: 2017-01-12 | Disposition: A | Discharge status: Home / Self Care | Payer: Medicaid Other | Source: Ambulatory Visit | Attending: Obstetrics and Gynecology | Admitting: Obstetrics and Gynecology

## 2017-01-12 DIAGNOSIS — O9989 Other specified diseases and conditions complicating pregnancy, childbirth and the puerperium: Secondary | ICD-10-CM | POA: Diagnosis not present

## 2017-01-12 DIAGNOSIS — O26893 Other specified pregnancy related conditions, third trimester: Secondary | ICD-10-CM | POA: Insufficient documentation

## 2017-01-12 DIAGNOSIS — Z3483 Encounter for supervision of other normal pregnancy, third trimester: Secondary | ICD-10-CM

## 2017-01-12 DIAGNOSIS — Z833 Family history of diabetes mellitus: Secondary | ICD-10-CM | POA: Diagnosis not present

## 2017-01-12 DIAGNOSIS — Z832 Family history of diseases of the blood and blood-forming organs and certain disorders involving the immune mechanism: Secondary | ICD-10-CM | POA: Diagnosis not present

## 2017-01-12 DIAGNOSIS — N898 Other specified noninflammatory disorders of vagina: Secondary | ICD-10-CM | POA: Diagnosis not present

## 2017-01-12 DIAGNOSIS — O0933 Supervision of pregnancy with insufficient antenatal care, third trimester: Secondary | ICD-10-CM | POA: Diagnosis not present

## 2017-01-12 DIAGNOSIS — O99013 Anemia complicating pregnancy, third trimester: Secondary | ICD-10-CM | POA: Diagnosis not present

## 2017-01-12 DIAGNOSIS — O99213 Obesity complicating pregnancy, third trimester: Secondary | ICD-10-CM | POA: Diagnosis not present

## 2017-01-12 DIAGNOSIS — D573 Sickle-cell trait: Secondary | ICD-10-CM | POA: Diagnosis not present

## 2017-01-12 DIAGNOSIS — Z68.41 Body mass index (BMI) pediatric, greater than or equal to 95th percentile for age: Secondary | ICD-10-CM | POA: Diagnosis not present

## 2017-01-12 DIAGNOSIS — Z3A28 28 weeks gestation of pregnancy: Secondary | ICD-10-CM | POA: Diagnosis not present

## 2017-01-12 DIAGNOSIS — Z87891 Personal history of nicotine dependence: Secondary | ICD-10-CM | POA: Diagnosis not present

## 2017-01-12 LAB — URINALYSIS, ROUTINE W REFLEX MICROSCOPIC
Bacteria, UA: NONE SEEN
Bilirubin Urine: NEGATIVE
Glucose, UA: NEGATIVE mg/dL
Hgb urine dipstick: NEGATIVE
KETONES UR: 5 mg/dL — AB
Nitrite: NEGATIVE
PROTEIN: NEGATIVE mg/dL
Specific Gravity, Urine: 1.019 (ref 1.005–1.030)
pH: 7 (ref 5.0–8.0)

## 2017-01-12 LAB — WET PREP, GENITAL
Clue Cells Wet Prep HPF POC: NONE SEEN
Sperm: NONE SEEN
TRICH WET PREP: NONE SEEN
YEAST WET PREP: NONE SEEN

## 2017-01-12 MED ORDER — PRENATAL VITAMIN 27-0.8 MG PO TABS: 1.0000 | ORAL_TABLET | Freq: Every day | ORAL | 5 refills | Status: DC

## 2017-01-12 NOTE — MAU Provider Note (Signed)
Chief Complaint:  vaginal mucous   First Provider Initiated Contact with Patient 01/12/17 1902      HPI: Kelly Hensley is a 21 y.o. G2P1001 at [redacted]w[redacted]d who presents to maternity admissions reporting mucus plug came out.  Patient came in today due to she noted her "mucous plug" came out. She denies any leaking of fluid, any vaginal bleeding, any contractions. She states she has good fetal movement. Denies any abnormal vaginal discharge otherwise. She states she read online that when the mucus flow comes out she may be going into labor very shortly. Patient has not received any prenatal care, this pregnancy is a short interval with her prior pregnancy, her current child is 57 months old.    Pregnancy Course:   Past Medical History: Past Medical History:  Diagnosis Date  . Anemia   . Cellulitis and abscess of trunk   . HPV (human papilloma virus) infection   . Migraines   . Morbid obesity (HCC)   . Sickle cell trait (HCC)     Past obstetric history: OB History  Gravida Para Term Preterm AB Living  0 0 1  SAB TAB Ectopic Multiple Live Births  0 0 0 0 1    # Outcome Date GA Lbr Len/2nd Weight Sex Delivery Anes PTL Lv  2 Current           1 Term 03/25/16 [redacted]w[redacted]d 37:30 / 03:17 7 lb 4.2 oz (3.295 kg) F Vag-Spont EPI  LIV      Past Surgical History: Past Surgical History:  Procedure Laterality Date  . ABCESS DRAINAGE Left 2014     Family History: Family History  Problem Relation Age of Onset  . Diabetes Mother   . Sickle cell anemia Father     Social History: Social History  Substance Use Topics  . Smoking status: Former Smoker    Packs/day: 0.25    Types: Cigarettes    Quit date: 02/17/2015  . Smokeless tobacco: Never Used  . Alcohol use No    Allergies: No Known Allergies  Meds:  Prescriptions Prior to Admission  Medication Sig Dispense Refill Last Dose  . ferrous sulfate (FERROUSUL) 325 (65 FE) MG tablet Take 1 tablet (325 mg total) by mouth every other day.  (Patient taking differently: Take 325 mg by mouth daily with breakfast. ) 30 tablet 0 12/29/2016 at Unknown time  . metroNIDAZOLE (FLAGYL) 500 MG tablet Take 1 tablet (500 mg total) by mouth 2 (two) times daily. 14 tablet 0     I have reviewed patient's Past Medical Hx, Surgical Hx, Family Hx, Social Hx, medications and allergies.   ROS:  A comprehensive ROS was negative except per HPI.    Physical Exam   Patient Vitals for the past 24 hrs:  BP Temp Pulse Resp Weight  01/12/17 1812 136/67 98 F (36.7 C) 90 18 262 lb (118.8 kg)   Constitutional: Well-developed, well-nourished female in no acute distress.  Cardiovascular: normal rate Respiratory: normal effort GI: Abd soft, non-tender, gravid appropriate for gestational age. Pos BS x 4 MS: Extremities nontender, no edema, normal ROM Neurologic: Alert and oriented x 4.  GU: Neg CVAT. Pelvic: NEFG, physiologic discharge, no blood, cervix clean. No CMT  SVE: Dilation: 1 Effacement (%): Thick Cervical Position: Posterior Presentation: Vertex Exam by:: Syd Manges     Labs: Results for orders placed or performed during the hospital encounter of 01/12/17 (from the past 24 hour(s))  Urinalysis, Routine w reflex microscopic  Status: Abnormal   Collection Time: 01/12/17  6:14 PM  Result Value Ref Range   Color, Urine YELLOW YELLOW   APPearance HAZY (A) CLEAR   Specific Gravity, Urine 1.019 1.005 - 1.030   pH 7.0 5.0 - 8.0   Glucose, UA NEGATIVE NEGATIVE mg/dL   Hgb urine dipstick NEGATIVE NEGATIVE   Bilirubin Urine NEGATIVE NEGATIVE   Ketones, ur 5 (A) NEGATIVE mg/dL   Protein, ur NEGATIVE NEGATIVE mg/dL   Nitrite NEGATIVE NEGATIVE   Leukocytes, UA TRACE (A) NEGATIVE   RBC / HPF 0-5 0 - 5 RBC/hpf   WBC, UA 0-5 0 - 5 WBC/hpf   Bacteria, UA NONE SEEN NONE SEEN   Squamous Epithelial / LPF 6-30 (A) NONE SEEN   Mucous PRESENT   Wet prep, genital     Status: Abnormal   Collection Time: 01/12/17  7:30 PM  Result Value Ref Range    Yeast Wet Prep HPF POC NONE SEEN NONE SEEN   Trich, Wet Prep NONE SEEN NONE SEEN   Clue Cells Wet Prep HPF POC NONE SEEN NONE SEEN   WBC, Wet Prep HPF POC FEW (A) NONE SEEN   Sperm NONE SEEN     Imaging:  No results found.  MAU Course: Wet Prep - NEG GC/CT - pending SSE - NO pooling SVE - 1 cm/ thick  I personally reviewed the patient's NST today, found to be REACTIVE. 135 bpm, mod var, +accels, no decels. CTX: None.   MDM: Plan of care reviewed with patient, including labs and tests ordered and medical treatment. Discussed importance of getting prenatal care (especially with short interval pregnancy), sent message to office to have patient follow up in 1 week for initial prenatal appointment. Also put in order for anatomy scan and for dating to be performed outpatient. Discussed indications to return to MAU including possible ROM, regular contractions, or decreased fetal movement. Educated on the indications of a mucous plug (ie. not indicating preterm labor or delivery).     Assessment: 1. Vaginal discharge during pregnancy in third trimester   2. Supervision of normal IUP (intrauterine pregnancy) in multigravida, third trimester     Plan: Discharge home in stable condition.  Preterm Labor precautions and fetal kick counts Follow up within 1 week for initial OB appointment Anatomy scan to be performed in the next 1-2 weeks   Follow-up Information    OB provider of your choice. Schedule an appointment as soon as possible for a visit in 1 week(s).   Why:  Initial prenatal appointment Contact information: Handout given of offices nearby          Allergies as of 01/12/2017   No Known Allergies     Medication List    TAKE these medications   ferrous sulfate 325 (65 FE) MG tablet Commonly known as:  FERROUSUL Take 1 tablet (325 mg total) by mouth every other day. What changed:  when to take this   metroNIDAZOLE 500 MG tablet Commonly known as:  FLAGYL Take 1  tablet (500 mg total) by mouth 2 (two) times daily.   Prenatal Vitamin 27-0.8 MG Tabs Take 1 tablet by mouth daily.       Jen Mow, DO [facility] 01/12/2017 8:23 PM

## 2017-01-12 NOTE — MAU Note (Signed)
Pt presents to MAU with complaints of "her mucous plug came out"  Denies any vaginal bleeding. Denies any pain

## 2017-01-12 NOTE — Discharge Instructions (Signed)

## 2017-01-15 LAB — GC/CHLAMYDIA PROBE AMP (~~LOC~~) NOT AT ARMC
CHLAMYDIA, DNA PROBE: NEGATIVE
NEISSERIA GONORRHEA: NEGATIVE

## 2017-01-17 ENCOUNTER — Other Ambulatory Visit (HOSPITAL_COMMUNITY)
Admission: RE | Admit: 2017-01-17 | Discharge: 2017-01-17 | Disposition: A | Discharge status: Home / Self Care | Payer: Medicaid Other | Source: Ambulatory Visit | Attending: Student | Admitting: Student

## 2017-01-17 ENCOUNTER — Ambulatory Visit (INDEPENDENT_AMBULATORY_CARE_PROVIDER_SITE_OTHER): Payer: Medicaid Other | Admitting: Student

## 2017-01-17 ENCOUNTER — Encounter: Payer: Self-pay | Admitting: Student

## 2017-01-17 VITALS — BP 123/61 | HR 86 | Wt 263.1 lb

## 2017-01-17 DIAGNOSIS — Z124 Encounter for screening for malignant neoplasm of cervix: Secondary | ICD-10-CM

## 2017-01-17 DIAGNOSIS — Z3483 Encounter for supervision of other normal pregnancy, third trimester: Secondary | ICD-10-CM

## 2017-01-17 DIAGNOSIS — Z113 Encounter for screening for infections with a predominantly sexual mode of transmission: Secondary | ICD-10-CM | POA: Diagnosis not present

## 2017-01-17 DIAGNOSIS — O0990 Supervision of high risk pregnancy, unspecified, unspecified trimester: Secondary | ICD-10-CM | POA: Insufficient documentation

## 2017-01-17 DIAGNOSIS — Z3482 Encounter for supervision of other normal pregnancy, second trimester: Secondary | ICD-10-CM | POA: Insufficient documentation

## 2017-01-17 LAB — POCT URINALYSIS DIP (DEVICE)
BILIRUBIN URINE: NEGATIVE
Glucose, UA: NEGATIVE mg/dL
HGB URINE DIPSTICK: NEGATIVE
KETONES UR: NEGATIVE mg/dL
Leukocytes, UA: NEGATIVE
NITRITE: NEGATIVE
PH: 7 (ref 5.0–8.0)
Protein, ur: NEGATIVE mg/dL
SPECIFIC GRAVITY, URINE: 1.02 (ref 1.005–1.030)
UROBILINOGEN UA: 1 mg/dL (ref 0.0–1.0)

## 2017-01-17 MED ORDER — TERCONAZOLE 0.4 % VA CREA: 1.0000 | TOPICAL_CREAM | Freq: Every day | VAGINAL | 0 refills | Status: DC

## 2017-01-17 NOTE — Progress Notes (Signed)
Declined Flu and Tdap

## 2017-01-17 NOTE — Patient Instructions (Signed)
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
## 2017-01-18 LAB — PRENATAL PROFILE I(LABCORP)
ANTIBODY SCREEN: NEGATIVE
BASOS: 0 %
Basophils Absolute: 0 10*3/uL (ref 0.0–0.2)
EOS (ABSOLUTE): 0 10*3/uL (ref 0.0–0.4)
Eos: 0 %
HEMATOCRIT: 33.8 % — AB (ref 34.0–46.6)
Hemoglobin: 11.4 g/dL (ref 11.1–15.9)
Hepatitis B Surface Ag: NEGATIVE
IMMATURE GRANS (ABS): 0 10*3/uL (ref 0.0–0.1)
Immature Granulocytes: 0 %
LYMPHS ABS: 1.5 10*3/uL (ref 0.7–3.1)
Lymphs: 20 %
MCH: 24.7 pg — AB (ref 26.6–33.0)
MCHC: 33.7 g/dL (ref 31.5–35.7)
MCV: 73 fL — AB (ref 79–97)
MONOS ABS: 0.6 10*3/uL (ref 0.1–0.9)
Monocytes: 9 %
NEUTROS ABS: 5.1 10*3/uL (ref 1.4–7.0)
Neutrophils: 71 %
Platelets: 314 10*3/uL (ref 150–379)
RBC: 4.62 x10E6/uL (ref 3.77–5.28)
RDW: 14.5 % (ref 12.3–15.4)
RH TYPE: POSITIVE
RPR: NONREACTIVE
Rubella Antibodies, IGG: 1.49 index (ref >0.99)
WBC: 7.2 10*3/uL (ref 3.4–10.8)

## 2017-01-18 LAB — CYTOLOGY - PAP
CANDIDA VAGINITIS: NEGATIVE
Chlamydia: NEGATIVE
Diagnosis: NEGATIVE
NEISSERIA GONORRHEA: NEGATIVE

## 2017-01-18 LAB — GLUCOSE TOLERANCE, 2 HOURS W/ 1HR
Glucose, 1 hour: 152 mg/dL (ref 65–179)
Glucose, 2 hour: 125 mg/dL (ref 65–152)
Glucose, Fasting: 80 mg/dL (ref 65–91)

## 2017-01-19 LAB — CULTURE, OB URINE

## 2017-01-19 LAB — URINE CULTURE, OB REFLEX

## 2017-01-28 ENCOUNTER — Encounter: Payer: Self-pay | Admitting: Family Medicine

## 2017-01-28 DIAGNOSIS — O0933 Supervision of pregnancy with insufficient antenatal care, third trimester: Secondary | ICD-10-CM | POA: Insufficient documentation

## 2017-01-28 DIAGNOSIS — Z6837 Body mass index (BMI) 37.0-37.9, adult: Secondary | ICD-10-CM | POA: Insufficient documentation

## 2017-01-28 HISTORY — DX: Body mass index (BMI) 37.0-37.9, adult: Z68.37

## 2017-01-31 ENCOUNTER — Ambulatory Visit (INDEPENDENT_AMBULATORY_CARE_PROVIDER_SITE_OTHER): Payer: Medicaid Other | Admitting: Advanced Practice Midwife

## 2017-01-31 ENCOUNTER — Ambulatory Visit (HOSPITAL_COMMUNITY)
Admission: RE | Admit: 2017-01-31 | Discharge: 2017-01-31 | Disposition: A | Discharge status: Home / Self Care | Payer: Medicaid Other | Source: Ambulatory Visit | Attending: Student | Admitting: Student

## 2017-01-31 ENCOUNTER — Other Ambulatory Visit: Payer: Self-pay | Admitting: Student

## 2017-01-31 VITALS — BP 125/61 | HR 88 | Wt 265.0 lb

## 2017-01-31 DIAGNOSIS — O99213 Obesity complicating pregnancy, third trimester: Secondary | ICD-10-CM

## 2017-01-31 DIAGNOSIS — O0933 Supervision of pregnancy with insufficient antenatal care, third trimester: Secondary | ICD-10-CM | POA: Diagnosis not present

## 2017-01-31 DIAGNOSIS — Z3687 Encounter for antenatal screening for uncertain dates: Secondary | ICD-10-CM

## 2017-01-31 DIAGNOSIS — Z3A30 30 weeks gestation of pregnancy: Secondary | ICD-10-CM

## 2017-01-31 DIAGNOSIS — O9989 Other specified diseases and conditions complicating pregnancy, childbirth and the puerperium: Secondary | ICD-10-CM

## 2017-01-31 DIAGNOSIS — Z3483 Encounter for supervision of other normal pregnancy, third trimester: Secondary | ICD-10-CM

## 2017-01-31 DIAGNOSIS — O0930 Supervision of pregnancy with insufficient antenatal care, unspecified trimester: Secondary | ICD-10-CM

## 2017-01-31 DIAGNOSIS — O26893 Other specified pregnancy related conditions, third trimester: Secondary | ICD-10-CM

## 2017-01-31 DIAGNOSIS — M549 Dorsalgia, unspecified: Secondary | ICD-10-CM

## 2017-01-31 DIAGNOSIS — Z3482 Encounter for supervision of other normal pregnancy, second trimester: Secondary | ICD-10-CM

## 2017-01-31 MED ORDER — PRENATAL ADULT GUMMY/DHA/FA 0.4-25 MG PO CHEW: 1.0000 | CHEWABLE_TABLET | Freq: Every day | ORAL | 11 refills | Status: DC

## 2017-01-31 MED ORDER — FERROUS SULFATE 325 (65 FE) MG PO TABS: 325.0000 mg | ORAL_TABLET | Freq: Every day | ORAL | 0 refills | Status: DC

## 2017-01-31 MED ORDER — COMFORT FIT MATERNITY SUPP LG MISC: 1.0000 | Freq: Every day | 0 refills | Status: DC
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
## 2017-01-31 NOTE — Patient Instructions (Addendum)
Third Trimester of Pregnancy The third trimester is from week 28 through week 40 (months 7 through 9). The third trimester is a time when the unborn baby (fetus) is growing rapidly. At the end of the ninth month, the fetus is about 20 inches in length and weighs 6-10 pounds. Body changes during your third trimester Your body will continue to go through many changes during pregnancy. The changes vary from woman to woman. During the third trimester:  Your weight will continue to increase. You can expect to gain 25-35 pounds (11-16 kg) by the end of the pregnancy.  You may begin to get stretch marks on your hips, abdomen, and breasts.  You may urinate more often because the fetus is moving lower into your pelvis and pressing on your bladder.  You may develop or continue to have heartburn. This is caused by increased hormones that slow down muscles in the digestive tract.  You may develop or continue to have constipation because increased hormones slow digestion and cause the muscles that push waste through your intestines to relax.  You may develop hemorrhoids. These are swollen veins (varicose veins) in the rectum that can itch or be painful.  You may develop swollen, bulging veins (varicose veins) in your legs.  You may have increased body aches in the pelvis, back, or thighs. This is due to weight gain and increased hormones that are relaxing your joints.  You may have changes in your hair. These can include thickening of your hair, rapid growth, and changes in texture. Some women also have hair loss during or after pregnancy, or hair that feels dry or thin. Your hair will most likely return to normal after your baby is born.  Your breasts will continue to grow and they will continue to become tender. A yellow fluid (colostrum) may leak from your breasts. This is the first milk you are producing for your baby.  Your belly button may stick out.  You may notice more swelling in your hands,  face, or ankles.  You may have increased tingling or numbness in your hands, arms, and legs. The skin on your belly may also feel numb.  You may feel short of breath because of your expanding uterus.  You may have more problems sleeping. This can be caused by the size of your belly, increased need to urinate, and an increase in your body's metabolism.  You may notice the fetus "dropping," or moving lower in your abdomen (lightening).  You may have increased vaginal discharge.  You may notice your joints feel loose and you may have pain around your pelvic bone. What to expect at prenatal visits You will have prenatal exams every 2 weeks until week 36. Then you will have weekly prenatal exams. During a routine prenatal visit:  You will be weighed to make sure you and the baby are growing normally.  Your blood pressure will be taken.  Your abdomen will be measured to track your baby's growth.  The fetal heartbeat will be listened to.  Any test results from the previous visit will be discussed.  You may have a cervical check near your due date to see if your cervix has softened or thinned (effaced).  You will be tested for Group B streptococcus. This happens between 35 and 37 weeks. Your health care provider may ask you:  What your birth plan is.  How you are feeling.  If you are feeling the baby move.  If you have had any abnormal   symptoms, such as leaking fluid, bleeding, severe headaches, or abdominal cramping.  If you are using any tobacco products, including cigarettes, chewing tobacco, and electronic cigarettes.  If you have any questions. Other tests or screenings that may be performed during your third trimester include:  Blood tests that check for low iron levels (anemia).  Fetal testing to check the health, activity level, and growth of the fetus. Testing is done if you have certain medical conditions or if there are problems during the pregnancy.  Nonstress test  (NST). This test checks the health of your baby to make sure there are no signs of problems, such as the baby not getting enough oxygen. During this test, a belt is placed around your belly. The baby is made to move, and its heart rate is monitored during movement. What is false labor? False labor is a condition in which you feel small, irregular tightenings of the muscles in the womb (contractions) that usually go away with rest, changing position, or drinking water. These are called Braxton Hicks contractions. Contractions may last for hours, days, or even weeks before true labor sets in. If contractions come at regular intervals, become more frequent, increase in intensity, or become painful, you should see your health care provider. What are the signs of labor?  Abdominal cramps.  Regular contractions that start at 10 minutes apart and become stronger and more frequent with time.  Contractions that start on the top of the uterus and spread down to the lower abdomen and back.  Increased pelvic pressure and dull back pain.  A watery or bloody mucus discharge that comes from the vagina.  Leaking of amniotic fluid. This is also known as your "water breaking." It could be a slow trickle or a gush. Let your health care provider know if it has a color or strange odor. If you have any of these signs, call your health care provider right away, even if it is before your due date. Follow these instructions at home: Medicines   Follow your health care provider's instructions regarding medicine use. Specific medicines may be either safe or unsafe to take during pregnancy.  Take a prenatal vitamin that contains at least 600 micrograms (mcg) of folic acid.  If you develop constipation, try taking a stool softener if your health care provider approves. Eating and drinking   Eat a balanced diet that includes fresh fruits and vegetables, whole grains, good sources of protein such as meat, eggs, or tofu,  and low-fat dairy. Your health care provider will help you determine the amount of weight gain that is right for you.  Avoid raw meat and uncooked cheese. These carry germs that can cause birth defects in the baby.  If you have low calcium intake from food, talk to your health care provider about whether you should take a daily calcium supplement.  Eat four or five small meals rather than three large meals a day.  Limit foods that are high in fat and processed sugars, such as fried and sweet foods.  To prevent constipation:  Drink enough fluid to keep your urine clear or pale yellow.  Eat foods that are high in fiber, such as fresh fruits and vegetables, whole grains, and beans. Activity   Exercise only as directed by your health care provider. Most women can continue their usual exercise routine during pregnancy. Try to exercise for 30 minutes at least 5 days a week. Stop exercising if you experience uterine contractions.  Avoid heavy lifting.  lifting.  Do not exercise in extreme heat or humidity, or at high altitudes.  Wear low-heel, comfortable shoes.  Practice good posture.  You may continue to have sex unless your health care provider tells you otherwise. Relieving pain and discomfort   Take frequent breaks and rest with your legs elevated if you have leg cramps or low back pain.  Take warm sitz baths to soothe any pain or discomfort caused by hemorrhoids. Use hemorrhoid cream if your health care provider approves.  Wear a good support bra to prevent discomfort from breast tenderness.  If you develop varicose veins:  Wear support pantyhose or compression stockings as told by your healthcare provider.  Elevate your feet for 15 minutes, 3-4 times a day. Prenatal care   Write down your questions. Take them to your prenatal visits.  Keep all your prenatal visits as told by your health care provider. This is important. Safety   Wear your seat belt  at all times when driving.  Make a list of emergency phone numbers, including numbers for family, friends, the hospital, and police and fire departments. General instructions   Avoid cat litter boxes and soil used by cats. These carry germs that can cause birth defects in the baby. If you have a cat, ask someone to clean the litter box for you.  Do not travel far distances unless it is absolutely necessary and only with the approval of your health care provider.  Do not use hot tubs, steam rooms, or saunas.  Do not drink alcohol.  Do not use any products that contain nicotine or tobacco, such as cigarettes and e-cigarettes. If you need help quitting, ask your health care provider.  Do not use any medicinal herbs or unprescribed drugs. These chemicals affect the formation and growth of the baby.  Do not douche or use tampons or scented sanitary pads.  Do not cross your legs for long periods of time.  To prepare for the arrival of your baby:  Take prenatal classes to understand, practice, and ask questions about labor and delivery.  Make a trial run to the hospital.  Visit the hospital and tour the maternity area.  Arrange for maternity or paternity leave through employers.  Arrange for family and friends to take care of pets while you are in the hospital.  Purchase a rear-facing car seat and make sure you know how to install it in your car.  Pack your hospital bag.  Prepare the baby's nursery. Make sure to remove all pillows and stuffed animals from the baby's crib to prevent suffocation.  Visit your dentist if you have not gone during your pregnancy. Use a soft toothbrush to brush your teeth and be gentle when you floss. Contact a health care provider if:  You are unsure if you are in labor or if your water has broken.  You become dizzy.  You have mild pelvic cramps, pelvic pressure, or nagging pain in your abdominal area.  You have lower back pain.  You have  persistent nausea, vomiting, or diarrhea.  You have an unusual or bad smelling vaginal discharge.  You have pain when you urinate. Get help right away if:  Your water breaks before 37 weeks.  You have regular contractions less than 5 minutes apart before 37 weeks.  You have a fever.  You are leaking fluid from your vagina.  You have spotting or bleeding from your vagina.  You have severe abdominal pain or cramping.  You have rapid weight   weight gain.  You have shortness of breath with chest pain.  You notice sudden or extreme swelling of your face, hands, ankles, feet, or legs.  Your baby makes fewer than 10 movements in 2 hours.  You have severe headaches that do not go away when you take medicine.  You have vision changes. Summary  The third trimester is from week 28 through week 40, months 7 through 9. The third trimester is a time when the unborn baby (fetus) is growing rapidly.  During the third trimester, your discomfort may increase as you and your baby continue to gain weight. You may have abdominal, leg, and back pain, sleeping problems, and an increased need to urinate.  During the third trimester your breasts will keep growing and they will continue to become tender. A yellow fluid (colostrum) may leak from your breasts. This is the first milk you are producing for your baby.  False labor is a condition in which you feel small, irregular tightenings of the muscles in the womb (contractions) that eventually go away. These are called Braxton Hicks contractions. Contractions may last for hours, days, or even weeks before true labor sets in.  Signs of labor can include: abdominal cramps; regular contractions that start at 10 minutes apart and become stronger and more frequent with time; watery or bloody mucus discharge that comes from the vagina; increased pelvic pressure and dull back pain; and leaking of amniotic fluid. This information is not intended to replace advice given  to you by your health care provider. Make sure you discuss any questions you have with your health care provider. Document Released: 08/29/2001 Document Revised: 02/10/2016 Document Reviewed: 11/05/2012 Elsevier Interactive Patient Education  2017 [employer]  Back Pain in Pregnancy Back pain during pregnancy is common. Back pain may be caused by several factors that are related to changes during your pregnancy. Follow these instructions at home: Managing pain, stiffness, and swelling   If directed, apply ice for sudden (acute) back pain.  Put ice in a plastic bag.  Place a towel between your skin and the bag.  Leave the ice on for 20 minutes, 2-3 times per day.  If directed, apply heat to the affected area before you exercise:  Place a towel between your skin and the heat pack or heating pad.  Leave the heat on for 20-30 minutes.  Remove the heat if your skin turns bright red. This is especially important if you are unable to feel pain, heat, or cold. You may have a greater risk of getting burned. Activity   Exercise as told by your health care provider. Exercising is the best way to prevent or manage back pain.  Listen to your body when lifting. If lifting hurts, ask for help or bend your knees. This uses your leg muscles instead of your back muscles.  Squat down when picking up something from the floor. Do not bend over.  Only use bed rest as told by your health care provider. Bed rest should only be used for the most severe episodes of back pain. Standing, Sitting, and Lying Down   Do not stand in one place for long periods of time.  Use good posture when sitting. Make sure your head rests over your shoulders and is not hanging forward. Use a pillow on your lower back if necessary.  Try sleeping on your side, preferably the left side, with a pillow or two between your legs. If you are sore after a night's rest, your  bed may be too soft. A firm mattress may provide more  support for your back during pregnancy. General instructions   Do not wear high heels.  Eat a healthy diet. Try to gain weight within your health care provider's recommendations.  Use a maternity girdle, elastic sling, or back brace as told by your health care provider.  Take over-the-counter and prescription medicines only as told by your health care provider.  Keep all follow-up visits as told by your health care provider. This is important. This includes any visits with any specialists, such as a physical therapist. Contact a health care provider if:  Your back pain interferes with your daily activities.  You have increasing pain in other parts of your body. Get help right away if:  You develop numbness, tingling, weakness, or problems with the use of your arms or legs.  You develop severe back pain that is not controlled with medicine.  You have a sudden change in bowel or bladder control.  You develop shortness of breath, dizziness, or you faint.  You develop nausea, vomiting, or sweating.  You have back pain that is a rhythmic, cramping pain similar to labor pains. Labor pain is usually 1-2 minutes apart, lasts for about 1 minute, and involves a bearing down feeling or pressure in your pelvis.  You have back pain and your water breaks or you have vaginal bleeding.  You have back pain or numbness that travels down your leg.  Your back pain developed after you fell.  You develop pain on one side of your back.  You see blood in your urine.  You develop skin blisters in the area of your back pain. This information is not intended to replace advice given to you by your health care provider. Make sure you discuss any questions you have with your health care provider. Document Released: 12/13/2005 Document Revised: 02/10/2016 Document Reviewed: 05/19/2015 Elsevier Interactive Patient Education  2017 [employer]

## 2017-01-31 NOTE — Progress Notes (Signed)
Pt declines tdap 

## 2017-01-31 NOTE — Addendum Note (Signed)
Addended by: Sharen CounterLEFTWICH-KIRBY, Tome Wilson A on: 01/31/2017 11:58 AM   Modules accepted: Orders

## 2017-02-20 ENCOUNTER — Encounter: Payer: Medicaid Other | Admitting: Advanced Practice Midwife

## 2017-02-21 ENCOUNTER — Ambulatory Visit (INDEPENDENT_AMBULATORY_CARE_PROVIDER_SITE_OTHER): Payer: Medicaid Other | Admitting: Obstetrics and Gynecology

## 2017-02-21 DIAGNOSIS — O9989 Other specified diseases and conditions complicating pregnancy, childbirth and the puerperium: Secondary | ICD-10-CM

## 2017-02-21 DIAGNOSIS — O0990 Supervision of high risk pregnancy, unspecified, unspecified trimester: Secondary | ICD-10-CM

## 2017-02-21 DIAGNOSIS — O26893 Other specified pregnancy related conditions, third trimester: Secondary | ICD-10-CM

## 2017-02-21 DIAGNOSIS — O0993 Supervision of high risk pregnancy, unspecified, third trimester: Secondary | ICD-10-CM

## 2017-02-21 DIAGNOSIS — M549 Dorsalgia, unspecified: Secondary | ICD-10-CM | POA: Insufficient documentation

## 2017-02-21 DIAGNOSIS — O99891 Other specified diseases and conditions complicating pregnancy: Secondary | ICD-10-CM

## 2017-02-21 LAB — POCT URINALYSIS DIP (DEVICE)
BILIRUBIN URINE: NEGATIVE
GLUCOSE, UA: NEGATIVE mg/dL
HGB URINE DIPSTICK: NEGATIVE
Ketones, ur: NEGATIVE mg/dL
NITRITE: NEGATIVE
Protein, ur: NEGATIVE mg/dL
Specific Gravity, Urine: 1.025 (ref 1.005–1.030)
UROBILINOGEN UA: 1 mg/dL (ref 0.0–1.0)
pH: 7 (ref 5.0–8.0)

## 2017-02-21 MED ORDER — CYCLOBENZAPRINE HCL 10 MG PO TABS: 10.0000 mg | ORAL_TABLET | Freq: Three times a day (TID) | ORAL | 1 refills | Status: DC | PRN

## 2017-02-21 NOTE — Progress Notes (Signed)
Patient complains of pain in back and knees. Also patient wanted to see if she can have a prescription pills for her yeast infection she states cream is not working for her.
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
## 2017-03-07 ENCOUNTER — Encounter: Payer: Medicaid Other | Admitting: Advanced Practice Midwife

## 2017-03-08 ENCOUNTER — Encounter: Payer: Medicaid Other | Admitting: Obstetrics and Gynecology

## 2017-03-09 ENCOUNTER — Telehealth: Payer: Self-pay | Admitting: Obstetrics & Gynecology

## 2017-03-09 NOTE — Telephone Encounter (Signed)
Patient called to reschedule her appointment, and to see about getting something for her yeast infection. Would like a call back from the nurse ASAP.

## 2017-03-13 ENCOUNTER — Other Ambulatory Visit: Payer: Self-pay

## 2017-03-13 MED ORDER — FLUCONAZOLE 150 MG PO TABS: 150.0000 mg | ORAL_TABLET | Freq: Once | ORAL | 0 refills | Status: AC

## 2017-03-13 NOTE — Telephone Encounter (Signed)
Called patient she stated she does not like to cream for her yeast infection. They were supposed to give to her at last visit and did not call in. Per Dr.Schenk we can call in one dose.

## 2017-03-20 ENCOUNTER — Ambulatory Visit (INDEPENDENT_AMBULATORY_CARE_PROVIDER_SITE_OTHER): Payer: Medicaid Other | Admitting: Advanced Practice Midwife

## 2017-03-20 ENCOUNTER — Other Ambulatory Visit (HOSPITAL_COMMUNITY)
Admission: RE | Admit: 2017-03-20 | Discharge: 2017-03-20 | Disposition: A | Discharge status: Home / Self Care | Payer: Medicaid Other | Source: Ambulatory Visit | Attending: Advanced Practice Midwife | Admitting: Advanced Practice Midwife

## 2017-03-20 VITALS — BP 135/82 | HR 95 | Wt 273.3 lb

## 2017-03-20 DIAGNOSIS — O0990 Supervision of high risk pregnancy, unspecified, unspecified trimester: Secondary | ICD-10-CM

## 2017-03-20 DIAGNOSIS — Z113 Encounter for screening for infections with a predominantly sexual mode of transmission: Secondary | ICD-10-CM | POA: Diagnosis not present

## 2017-03-20 DIAGNOSIS — Z3A37 37 weeks gestation of pregnancy: Secondary | ICD-10-CM | POA: Diagnosis not present

## 2017-03-20 DIAGNOSIS — O0993 Supervision of high risk pregnancy, unspecified, third trimester: Secondary | ICD-10-CM | POA: Diagnosis present

## 2017-03-20 LAB — OB RESULTS CONSOLE GC/CHLAMYDIA: GC PROBE AMP, GENITAL: NEGATIVE

## 2017-03-20 LAB — OB RESULTS CONSOLE GBS: GBS: NEGATIVE

## 2017-03-20 NOTE — Addendum Note (Signed)
Addended by: Faythe CasaBELLAMY, Ivorie Uplinger M on: 03/20/2017 05:28 PM   Modules accepted: Orders

## 2017-03-20 NOTE — Patient Instructions (Signed)
Back Exercises If you have pain in your back, do these exercises 2-3 times each day or as told by your doctor. When the pain goes away, do the exercises once each day, but repeat the steps more times for each exercise (do more repetitions). If you do not have pain in your back, do these exercises once each day or as told by your doctor. Exercises Single Knee to Chest  Do these steps 3-5 times in a row for each leg: 1. Lie on your back on a firm bed or the floor with your legs stretched out. 2. Bring one knee to your chest. 3. Hold your knee to your chest by grabbing your knee or thigh. 4. Pull on your knee until you feel a gentle stretch in your lower back. 5. Keep doing the stretch for 10-30 seconds. 6. Slowly let go of your leg and straighten it.  Pelvic Tilt  Do these steps 5-10 times in a row: 1. Lie on your back on a firm bed or the floor with your legs stretched out. 2. Bend your knees so they point up to the ceiling. Your feet should be flat on the floor. 3. Tighten your lower belly (abdomen) muscles to press your lower back against the floor. This will make your tailbone point up to the ceiling instead of pointing down to your feet or the floor. 4. Stay in this position for 5-10 seconds while you gently tighten your muscles and breathe evenly.  Cat-Cow  Do these steps until your lower back bends more easily: 1. Get on your hands and knees on a firm surface. Keep your hands under your shoulders, and keep your knees under your hips. You may put padding under your knees. 2. Let your head hang down, and make your tailbone point down to the floor so your lower back is round like the back of a cat. 3. Stay in this position for 5 seconds. 4. Slowly lift your head and make your tailbone point up to the ceiling so your back hangs low (sags) like the back of a cow. 5. Stay in this position for 5 seconds.  Press-Ups  Do these steps 5-10 times in a row: 1. Lie on your belly (face-down)  on the floor. 2. Place your hands near your head, about shoulder-width apart. 3. While you keep your back relaxed and keep your hips on the floor, slowly straighten your arms to raise the top half of your body and lift your shoulders. Do not use your back muscles. To make yourself more comfortable, you may change where you place your hands. 4. Stay in this position for 5 seconds. 5. Slowly return to lying flat on the floor.  Bridges  Do these steps 10 times in a row: 1. Lie on your back on a firm surface. 2. Bend your knees so they point up to the ceiling. Your feet should be flat on the floor. 3. Tighten your butt muscles and lift your butt off of the floor until your waist is almost as high as your knees. If you do not feel the muscles working in your butt and the back of your thighs, slide your feet 1-2 inches farther away from your butt. 4. Stay in this position for 3-5 seconds. 5. Slowly lower your butt to the floor, and let your butt muscles relax.  If this exercise is too easy, try doing it with your arms crossed over your chest. Belly Crunches  Do these steps 5-10 times in   a row: 1. Lie on your back on a firm bed or the floor with your legs stretched out. 2. Bend your knees so they point up to the ceiling. Your feet should be flat on the floor. 3. Cross your arms over your chest. 4. Tip your chin a little bit toward your chest but do not bend your neck. 5. Tighten your belly muscles and slowly raise your chest just enough to lift your shoulder blades a tiny bit off of the floor. 6. Slowly lower your chest and your head to the floor.  Back Lifts Do these steps 5-10 times in a row: 1. Lie on your belly (face-down) with your arms at your sides, and rest your forehead on the floor. 2. Tighten the muscles in your legs and your butt. 3. Slowly lift your chest off of the floor while you keep your hips on the floor. Keep the back of your head in line with the curve in your back. Look at  the floor while you do this. 4. Stay in this position for 3-5 seconds. 5. Slowly lower your chest and your face to the floor.  Contact a doctor if:  Your back pain gets a lot worse when you do an exercise.  Your back pain does not lessen 2 hours after you exercise. If you have any of these problems, stop doing the exercises. Do not do them again unless your doctor says it is okay. Get help right away if:  You have sudden, very bad back pain. If this happens, stop doing the exercises. Do not do them again unless your doctor says it is okay. This information is not intended to replace advice given to you by your health care provider. Make sure you discuss any questions you have with your health care provider. Document Released: 10/07/2010 Document Revised: 02/10/2016 Document Reviewed: 10/29/2014 Elsevier Interactive Patient Education  [employer]. Third Trimester of Pregnancy The third trimester is from week 28 through week 40 (months 7 through 9). The third trimester is a time when the unborn baby (fetus) is growing rapidly. At the end of the ninth month, the fetus is about 20 inches in length and weighs 6-10 pounds. Body changes during your third trimester Your body will continue to go through many changes during pregnancy. The changes vary from woman to woman. During the third trimester:  Your weight will continue to increase. You can expect to gain 25-35 pounds (11-16 kg) by the end of the pregnancy.  You may begin to get stretch marks on your hips, abdomen, and breasts.  You may urinate more often because the fetus is moving lower into your pelvis and pressing on your bladder.  You may develop or continue to have heartburn. This is caused by increased hormones that slow down muscles in the digestive tract.  You may develop or continue to have constipation because increased hormones slow digestion and cause the muscles that push waste through your intestines to relax.  You  may develop hemorrhoids. These are swollen veins (varicose veins) in the rectum that can itch or be painful.  You may develop swollen, bulging veins (varicose veins) in your legs.  You may have increased body aches in the pelvis, back, or thighs. This is due to weight gain and increased hormones that are relaxing your joints.  You may have changes in your hair. These can include thickening of your hair, rapid growth, and changes in texture. Some women also have hair loss during or after  pregnancy, or hair that feels dry or thin. Your hair will most likely return to normal after your baby is born.  Your breasts will continue to grow and they will continue to become tender. A yellow fluid (colostrum) may leak from your breasts. This is the first milk you are producing for your baby.  Your belly button may stick out.  You may notice more swelling in your hands, face, or ankles.  You may have increased tingling or numbness in your hands, arms, and legs. The skin on your belly may also feel numb.  You may feel short of breath because of your expanding uterus.  You may have more problems sleeping. This can be caused by the size of your belly, increased need to urinate, and an increase in your body's metabolism.  You may notice the fetus "dropping," or moving lower in your abdomen (lightening).  You may have increased vaginal discharge.  You may notice your joints feel loose and you may have pain around your pelvic bone.  What to expect at prenatal visits You will have prenatal exams every 2 weeks until week 36. Then you will have weekly prenatal exams. During a routine prenatal visit:  You will be weighed to make sure you and the baby are growing normally.  Your blood pressure will be taken.  Your abdomen will be measured to track your baby's growth.  The fetal heartbeat will be listened to.  Any test results from the previous visit will be discussed.  You may have a cervical check  near your due date to see if your cervix has softened or thinned (effaced).  You will be tested for Group B streptococcus. This happens between 35 and 37 weeks.  Your health care provider may ask you:  What your birth plan is.  How you are feeling.  If you are feeling the baby move.  If you have had any abnormal symptoms, such as leaking fluid, bleeding, severe headaches, or abdominal cramping.  If you are using any tobacco products, including cigarettes, chewing tobacco, and electronic cigarettes.  If you have any questions.  Other tests or screenings that may be performed during your third trimester include:  Blood tests that check for low iron levels (anemia).  Fetal testing to check the health, activity level, and growth of the fetus. Testing is done if you have certain medical conditions or if there are problems during the pregnancy.  Nonstress test (NST). This test checks the health of your baby to make sure there are no signs of problems, such as the baby not getting enough oxygen. During this test, a belt is placed around your belly. The baby is made to move, and its heart rate is monitored during movement.  What is false labor? False labor is a condition in which you feel small, irregular tightenings of the muscles in the womb (contractions) that usually go away with rest, changing position, or drinking water. These are called Braxton Hicks contractions. Contractions may last for hours, days, or even weeks before true labor sets in. If contractions come at regular intervals, become more frequent, increase in intensity, or become painful, you should see your health care provider. What are the signs of labor?  Abdominal cramps.  Regular contractions that start at 10 minutes apart and become stronger and more frequent with time.  Contractions that start on the top of the uterus and spread down to the lower abdomen and back.  Increased pelvic pressure and dull back pain.  A  watery or bloody mucus discharge that comes from the vagina.  Leaking of amniotic fluid. This is also known as your "water breaking." It could be a slow trickle or a gush. Let your health care provider know if it has a color or strange odor. If you have any of these signs, call your health care provider right away, even if it is before your due date. Follow these instructions at home: Medicines  Follow your health care provider's instructions regarding medicine use. Specific medicines may be either safe or unsafe to take during pregnancy.  Take a prenatal vitamin that contains at least 600 micrograms (mcg) of folic acid.  If you develop constipation, try taking a stool softener if your health care provider approves. Eating and drinking  Eat a balanced diet that includes fresh fruits and vegetables, whole grains, good sources of protein such as meat, eggs, or tofu, and low-fat dairy. Your health care provider will help you determine the amount of weight gain that is right for you.  Avoid raw meat and uncooked cheese. These carry germs that can cause birth defects in the baby.  If you have low calcium intake from food, talk to your health care provider about whether you should take a daily calcium supplement.  Eat four or five small meals rather than three large meals a day.  Limit foods that are high in fat and processed sugars, such as fried and sweet foods.  To prevent constipation: ? Drink enough fluid to keep your urine clear or pale yellow. ? Eat foods that are high in fiber, such as fresh fruits and vegetables, whole grains, and beans. Activity  Exercise only as directed by your health care provider. Most women can continue their usual exercise routine during pregnancy. Try to exercise for 30 minutes at least 5 days a week. Stop exercising if you experience uterine contractions.  Avoid heavy lifting.  Do not exercise in extreme heat or humidity, or at high altitudes.  Wear  low-heel, comfortable shoes.  Practice good posture.  You may continue to have sex unless your health care provider tells you otherwise. Relieving pain and discomfort  Take frequent breaks and rest with your legs elevated if you have leg cramps or low back pain.  Take warm sitz baths to soothe any pain or discomfort caused by hemorrhoids. Use hemorrhoid cream if your health care provider approves.  Wear a good support bra to prevent discomfort from breast tenderness.  If you develop varicose veins: ? Wear support pantyhose or compression stockings as told by your healthcare provider. ? Elevate your feet for 15 minutes, 3-4 times a day. Prenatal care  Write down your questions. Take them to your prenatal visits.  Keep all your prenatal visits as told by your health care provider. This is important. Safety  Wear your seat belt at all times when driving.  Make a list of emergency phone numbers, including numbers for family, friends, the hospital, and police and fire departments. General instructions  Avoid cat litter boxes and soil used by cats. These carry germs that can cause birth defects in the baby. If you have a cat, ask someone to clean the litter box for you.  Do not travel far distances unless it is absolutely necessary and only with the approval of your health care provider.  Do not use hot tubs, steam rooms, or saunas.  Do not drink alcohol.  Do not use any products that contain nicotine or tobacco, such as cigarettes  and e-cigarettes. If you need help quitting, ask your health care provider.  Do not use any medicinal herbs or unprescribed drugs. These chemicals affect the formation and growth of the baby.  Do not douche or use tampons or scented sanitary pads.  Do not cross your legs for long periods of time.  To prepare for the arrival of your baby: ? Take prenatal classes to understand, practice, and ask questions about labor and delivery. ? Make a trial run  to the hospital. ? Visit the hospital and tour the maternity area. ? Arrange for maternity or paternity leave through employers. ? Arrange for family and friends to take care of pets while you are in the hospital. ? Purchase a rear-facing car seat and make sure you know how to install it in your car. ? Pack your hospital bag. ? Prepare the baby's nursery. Make sure to remove all pillows and stuffed animals from the baby's crib to prevent suffocation.  Visit your dentist if you have not gone during your pregnancy. Use a soft toothbrush to brush your teeth and be gentle when you floss. Contact a health care provider if:  You are unsure if you are in labor or if your water has broken.  You become dizzy.  You have mild pelvic cramps, pelvic pressure, or nagging pain in your abdominal area.  You have lower back pain.  You have persistent nausea, vomiting, or diarrhea.  You have an unusual or bad smelling vaginal discharge.  You have pain when you urinate. Get help right away if:  Your water breaks before 37 weeks.  You have regular contractions less than 5 minutes apart before 37 weeks.  You have a fever.  You are leaking fluid from your vagina.  You have spotting or bleeding from your vagina.  You have severe abdominal pain or cramping.  You have rapid weight loss or weight gain.  You have shortness of breath with chest pain.  You notice sudden or extreme swelling of your face, hands, ankles, feet, or legs.  Your baby makes fewer than 10 movements in 2 hours.  You have severe headaches that do not go away when you take medicine.  You have vision changes. Summary  The third trimester is from week 28 through week 40, months 7 through 9. The third trimester is a time when the unborn baby (fetus) is growing rapidly.  During the third trimester, your discomfort may increase as you and your baby continue to gain weight. You may have abdominal, leg, and back pain, sleeping  problems, and an increased need to urinate.  During the third trimester your breasts will keep growing and they will continue to become tender. A yellow fluid (colostrum) may leak from your breasts. This is the first milk you are producing for your baby.  False labor is a condition in which you feel small, irregular tightenings of the muscles in the womb (contractions) that eventually go away. These are called Braxton Hicks contractions. Contractions may last for hours, days, or even weeks before true labor sets in.  Signs of labor can include: abdominal cramps; regular contractions that start at 10 minutes apart and become stronger and more frequent with time; watery or bloody mucus discharge that comes from the vagina; increased pelvic pressure and dull back pain; and leaking of amniotic fluid. This information is not intended to replace advice given to you by your health care provider. Make sure you discuss any questions you have with your health care  provider. Document Released: 08/29/2001 Document Revised: 02/10/2016 Document Reviewed: 11/05/2012 Elsevier Interactive Patient Education  2017 [employer].
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
## 2017-03-22 ENCOUNTER — Inpatient Hospital Stay (HOSPITAL_COMMUNITY)
Admission: AD | Admit: 2017-03-22 | Discharge: 2017-03-22 | Disposition: A | Discharge status: Home / Self Care | Payer: Medicaid Other | Source: Ambulatory Visit | Attending: Obstetrics and Gynecology | Admitting: Obstetrics and Gynecology

## 2017-03-22 ENCOUNTER — Encounter (HOSPITAL_COMMUNITY): Payer: Self-pay

## 2017-03-22 DIAGNOSIS — Z3A37 37 weeks gestation of pregnancy: Secondary | ICD-10-CM | POA: Diagnosis not present

## 2017-03-22 DIAGNOSIS — O471 False labor at or after 37 completed weeks of gestation: Secondary | ICD-10-CM | POA: Insufficient documentation

## 2017-03-22 DIAGNOSIS — O479 False labor, unspecified: Secondary | ICD-10-CM

## 2017-03-22 LAB — URINALYSIS, ROUTINE W REFLEX MICROSCOPIC
BACTERIA UA: NONE SEEN
BILIRUBIN URINE: NEGATIVE
GLUCOSE, UA: NEGATIVE mg/dL
HGB URINE DIPSTICK: NEGATIVE
Ketones, ur: 5 mg/dL — AB
LEUKOCYTES UA: NEGATIVE
NITRITE: NEGATIVE
PROTEIN: 30 mg/dL — AB
Specific Gravity, Urine: 1.023 (ref 1.005–1.030)
pH: 6 (ref 5.0–8.0)

## 2017-03-22 NOTE — Progress Notes (Signed)
EFM, SVE and education reviewed. Md notified.  Pt ready for discharge.

## 2017-03-22 NOTE — Progress Notes (Signed)
Pt DC home with labor and round ligament pain education as well as kick count information .  Pt verbalizes understanding and has not further needs at this time.

## 2017-03-22 NOTE — MAU Note (Signed)
G2P1 at 37.6 weeks, c/o pelvic pain, loss of mucus plug, back pain and cramping.  Pt states "I think my cervix is opening up."  NO Ob or med history.  Short interval pregnancy SVD 03/2016.

## 2017-03-23 LAB — GC/CHLAMYDIA PROBE AMP (~~LOC~~) NOT AT ARMC
CHLAMYDIA, DNA PROBE: NEGATIVE
NEISSERIA GONORRHEA: NEGATIVE

## 2017-03-24 LAB — CULTURE, BETA STREP (GROUP B ONLY): STREP GP B CULTURE: NEGATIVE

## 2017-03-25 ENCOUNTER — Encounter (HOSPITAL_COMMUNITY): Payer: Self-pay | Admitting: *Deleted

## 2017-03-25 ENCOUNTER — Inpatient Hospital Stay (HOSPITAL_COMMUNITY)
Admission: AD | Admit: 2017-03-25 | Discharge: 2017-03-26 | Disposition: A | Discharge status: Home / Self Care | Payer: Medicaid Other | Source: Ambulatory Visit | Attending: Obstetrics & Gynecology | Admitting: Obstetrics & Gynecology

## 2017-03-25 DIAGNOSIS — O0990 Supervision of high risk pregnancy, unspecified, unspecified trimester: Secondary | ICD-10-CM

## 2017-03-25 DIAGNOSIS — O99210 Obesity complicating pregnancy, unspecified trimester: Secondary | ICD-10-CM

## 2017-03-25 DIAGNOSIS — Z3A38 38 weeks gestation of pregnancy: Secondary | ICD-10-CM | POA: Diagnosis not present

## 2017-03-25 DIAGNOSIS — O99891 Other specified diseases and conditions complicating pregnancy: Secondary | ICD-10-CM

## 2017-03-25 DIAGNOSIS — O479 False labor, unspecified: Secondary | ICD-10-CM | POA: Diagnosis not present

## 2017-03-25 DIAGNOSIS — O9989 Other specified diseases and conditions complicating pregnancy, childbirth and the puerperium: Secondary | ICD-10-CM

## 2017-03-25 DIAGNOSIS — O0933 Supervision of pregnancy with insufficient antenatal care, third trimester: Secondary | ICD-10-CM

## 2017-03-25 DIAGNOSIS — M549 Dorsalgia, unspecified: Secondary | ICD-10-CM

## 2017-03-25 MED ORDER — ZOLPIDEM TARTRATE 5 MG PO TABS
5.0000 mg | ORAL_TABLET | Freq: Once | ORAL | Status: AC
  Administered 2017-03-25: 5 mg via ORAL
  Filled 2017-03-25: qty 1

## 2017-03-25 NOTE — MAU Note (Signed)
Pt here with contractions all day today; Cervix was 3 cm on last exam. Denies any bleeding or leaking of fluid. Reports positive fetal movement.

## 2017-03-25 NOTE — MAU Note (Signed)
I have communicated with Fabian NovemberM. Bhambri CNM and reviewed vital signs:  Vitals:   03/25/17 2135 03/25/17 2331  BP: 134/74 124/77  Pulse: (!) 105 98  Resp: 18 16  Temp: 98.3 F (36.8 C)     Vaginal exam:  Dilation: 3.5 Effacement (%): 70, 80 Cervical Position: Posterior Station: -3 Presentation: Vertex Exam by:: Elie ConferK. Mylena Sedberry RN,   Also reviewed contraction pattern and that non-stress test is reactive.  It has been documented that patient is contracting every 12 minutes with no cervical change over 1 1/2 hours not indicating active labor.  Patient denies any other complaints.  Based on this report provider has given order for discharge.  A discharge order and diagnosis entered by a provider.   Labor discharge instructions reviewed with patient.

## 2017-03-25 NOTE — Discharge Instructions (Signed)

## 2017-03-27 ENCOUNTER — Inpatient Hospital Stay (HOSPITAL_COMMUNITY)
Admission: AD | Admit: 2017-03-27 | Discharge: 2017-03-28 | Disposition: A | Discharge status: Home / Self Care | Payer: Medicaid Other | Source: Ambulatory Visit | Attending: Family Medicine | Admitting: Family Medicine

## 2017-03-27 DIAGNOSIS — Z3A00 Weeks of gestation of pregnancy not specified: Secondary | ICD-10-CM | POA: Insufficient documentation

## 2017-03-27 DIAGNOSIS — O479 False labor, unspecified: Secondary | ICD-10-CM | POA: Insufficient documentation

## 2017-03-27 NOTE — Discharge Instructions (Signed)

## 2017-03-27 NOTE — MAU Note (Signed)
CTX for past hour, unsure of how far apart.  No LOF or VB.  Reports good FM.  Last VE 3.5

## 2017-03-28 ENCOUNTER — Ambulatory Visit (INDEPENDENT_AMBULATORY_CARE_PROVIDER_SITE_OTHER): Payer: Medicaid Other | Admitting: Advanced Practice Midwife

## 2017-03-28 VITALS — BP 122/65 | HR 97 | Wt 272.5 lb

## 2017-03-28 DIAGNOSIS — O0990 Supervision of high risk pregnancy, unspecified, unspecified trimester: Secondary | ICD-10-CM

## 2017-03-28 DIAGNOSIS — O479 False labor, unspecified: Secondary | ICD-10-CM | POA: Diagnosis not present

## 2017-03-28 DIAGNOSIS — Z3A00 Weeks of gestation of pregnancy not specified: Secondary | ICD-10-CM | POA: Diagnosis not present

## 2017-03-28 DIAGNOSIS — Z3483 Encounter for supervision of other normal pregnancy, third trimester: Secondary | ICD-10-CM

## 2017-03-28 NOTE — MAU Note (Signed)
I have communicated with Dr. Mosetta PuttFeng and Raelyn Moraolitta Dawson, CNM and reviewed vital signs:  Vitals:   03/27/17 2242 03/27/17 2352  BP: 131/74 133/76  Pulse: 98 89  Resp: 19 19  Temp: 98.4 F (36.9 C)     Vaginal exam:  Dilation: 3.5 Effacement (%): 70 Cervical Position: Posterior Station: -3 Presentation: Vertex Exam by:: Latricia HeftAnna Dolph Tavano, RN,   Also reviewed contraction pattern and that non-stress test is reactive.  It has been documented that patient is contracting irregularly with no cervical change over 1 hour not indicating active labor.  Patient denies any other complaints.  Based on this report provider has given order for discharge.  A discharge order and diagnosis entered by a provider.   Labor discharge instructions reviewed with patient.

## 2017-03-28 NOTE — Progress Notes (Signed)
Declines tdap 

## 2017-03-28 NOTE — Patient Instructions (Signed)
Back Exercises The following exercises strengthen the muscles that help to support the back. They also help to keep the lower back flexible. Doing these exercises can help to prevent back pain or lessen existing pain. If you have back pain or discomfort, try doing these exercises 2-3 times each day or as told by your health care provider. When the pain goes away, do them once each day, but increase the number of times that you repeat the steps for each exercise (do more repetitions). If you do not have back pain or discomfort, do these exercises once each day or as told by your health care provider. Exercises Single Knee to Chest  Repeat these steps 3-5 times for each leg: 1. Lie on your back on a firm bed or the floor with your legs extended. 2. Bring one knee to your chest. Your other leg should stay extended and in contact with the floor. 3. Hold your knee in place by grabbing your knee or thigh. 4. Pull on your knee until you feel a gentle stretch in your lower back. 5. Hold the stretch for 10-30 seconds. 6. Slowly release and straighten your leg.  Pelvic Tilt  Repeat these steps 5-10 times: 1. Lie on your back on a firm bed or the floor with your legs extended. 2. Bend your knees so they are pointing toward the ceiling and your feet are flat on the floor. 3. Tighten your lower abdominal muscles to press your lower back against the floor. This motion will tilt your pelvis so your tailbone points up toward the ceiling instead of pointing to your feet or the floor. 4. With gentle tension and even breathing, hold this position for 5-10 seconds.  Cat-Cow  Repeat these steps until your lower back becomes more flexible: 1. Get into a hands-and-knees position on a firm surface. Keep your hands under your shoulders, and keep your knees under your hips. You may place padding under your knees for comfort. 2. Let your head hang down, and point your tailbone toward the floor so your lower back  becomes rounded like the back of a cat. 3. Hold this position for 5 seconds. 4. Slowly lift your head and point your tailbone up toward the ceiling so your back forms a sagging arch like the back of a cow. 5. Hold this position for 5 seconds.  Press-Ups  Repeat these steps 5-10 times: 1. Lie on your abdomen (face-down) on the floor. 2. Place your palms near your head, about shoulder-width apart. 3. While you keep your back as relaxed as possible and keep your hips on the floor, slowly straighten your arms to raise the top half of your body and lift your shoulders. Do not use your back muscles to raise your upper torso. You may adjust the placement of your hands to make yourself more comfortable. 4. Hold this position for 5 seconds while you keep your back relaxed. 5. Slowly return to lying flat on the floor.  Bridges  Repeat these steps 10 times: 1. Lie on your back on a firm surface. 2. Bend your knees so they are pointing toward the ceiling and your feet are flat on the floor. 3. Tighten your buttocks muscles and lift your buttocks off of the floor until your waist is at almost the same height as your knees. You should feel the muscles working in your buttocks and the back of your thighs. If you do not feel these muscles, slide your feet 1-2 inches farther away   from your buttocks. 4. Hold this position for 3-5 seconds. 5. Slowly lower your hips to the starting position, and allow your buttocks muscles to relax completely.  If this exercise is too easy, try doing it with your arms crossed over your chest. Abdominal Crunches  Repeat these steps 5-10 times: 1. Lie on your back on a firm bed or the floor with your legs extended. 2. Bend your knees so they are pointing toward the ceiling and your feet are flat on the floor. 3. Cross your arms over your chest. 4. Tip your chin slightly toward your chest without bending your neck. 5. Tighten your abdominal muscles and slowly raise your  trunk (torso) high enough to lift your shoulder blades a tiny bit off of the floor. Avoid raising your torso higher than that, because it can put too much stress on your low back and it does not help to strengthen your abdominal muscles. 6. Slowly return to your starting position.  Back Lifts Repeat these steps 5-10 times: 1. Lie on your abdomen (face-down) with your arms at your sides, and rest your forehead on the floor. 2. Tighten the muscles in your legs and your buttocks. 3. Slowly lift your chest off of the floor while you keep your hips pressed to the floor. Keep the back of your head in line with the curve in your back. Your eyes should be looking at the floor. 4. Hold this position for 3-5 seconds. 5. Slowly return to your starting position.  Contact a health care provider if:  Your back pain or discomfort gets much worse when you do an exercise.  Your back pain or discomfort does not lessen within 2 hours after you exercise. If you have any of these problems, stop doing these exercises right away. Do not do them again unless your health care provider says that you can. Get help right away if:  You develop sudden, severe back pain. If this happens, stop doing the exercises right away. Do not do them again unless your health care provider says that you can. This information is not intended to replace advice given to you by your health care provider. Make sure you discuss any questions you have with your health care provider. Document Released: 10/12/2004 Document Revised: 01/12/2016 Document Reviewed: 10/29/2014 Elsevier Interactive Patient Education  2017 [employer]. Third Trimester of Pregnancy The third trimester is from week 28 through week 40 (months 7 through 9). The third trimester is a time when the unborn baby (fetus) is growing rapidly. At the end of the ninth month, the fetus is about 20 inches in length and weighs 6-10 pounds. Body changes during your third  trimester Your body will continue to go through many changes during pregnancy. The changes vary from woman to woman. During the third trimester:  Your weight will continue to increase. You can expect to gain 25-35 pounds (11-16 kg) by the end of the pregnancy.  You may begin to get stretch marks on your hips, abdomen, and breasts.  You may urinate more often because the fetus is moving lower into your pelvis and pressing on your bladder.  You may develop or continue to have heartburn. This is caused by increased hormones that slow down muscles in the digestive tract.  You may develop or continue to have constipation because increased hormones slow digestion and cause the muscles that push waste through your intestines to relax.  You may develop hemorrhoids. These are swollen veins (varicose veins) in the  rectum that can itch or be painful.  You may develop swollen, bulging veins (varicose veins) in your legs.  You may have increased body aches in the pelvis, back, or thighs. This is due to weight gain and increased hormones that are relaxing your joints.  You may have changes in your hair. These can include thickening of your hair, rapid growth, and changes in texture. Some women also have hair loss during or after pregnancy, or hair that feels dry or thin. Your hair will most likely return to normal after your baby is born.  Your breasts will continue to grow and they will continue to become tender. A yellow fluid (colostrum) may leak from your breasts. This is the first milk you are producing for your baby.  Your belly button may stick out.  You may notice more swelling in your hands, face, or ankles.  You may have increased tingling or numbness in your hands, arms, and legs. The skin on your belly may also feel numb.  You may feel short of breath because of your expanding uterus.  You may have more problems sleeping. This can be caused by the size of your belly, increased need to  urinate, and an increase in your body's metabolism.  You may notice the fetus "dropping," or moving lower in your abdomen (lightening).  You may have increased vaginal discharge.  You may notice your joints feel loose and you may have pain around your pelvic bone.  What to expect at prenatal visits You will have prenatal exams every 2 weeks until week 36. Then you will have weekly prenatal exams. During a routine prenatal visit:  You will be weighed to make sure you and the baby are growing normally.  Your blood pressure will be taken.  Your abdomen will be measured to track your baby's growth.  The fetal heartbeat will be listened to.  Any test results from the previous visit will be discussed.  You may have a cervical check near your due date to see if your cervix has softened or thinned (effaced).  You will be tested for Group B streptococcus. This happens between 35 and 37 weeks.  Your health care provider may ask you:  What your birth plan is.  How you are feeling.  If you are feeling the baby move.  If you have had any abnormal symptoms, such as leaking fluid, bleeding, severe headaches, or abdominal cramping.  If you are using any tobacco products, including cigarettes, chewing tobacco, and electronic cigarettes.  If you have any questions.  Other tests or screenings that may be performed during your third trimester include:  Blood tests that check for low iron levels (anemia).  Fetal testing to check the health, activity level, and growth of the fetus. Testing is done if you have certain medical conditions or if there are problems during the pregnancy.  Nonstress test (NST). This test checks the health of your baby to make sure there are no signs of problems, such as the baby not getting enough oxygen. During this test, a belt is placed around your belly. The baby is made to move, and its heart rate is monitored during movement.  What is false labor? False  labor is a condition in which you feel small, irregular tightenings of the muscles in the womb (contractions) that usually go away with rest, changing position, or drinking water. These are called Braxton Hicks contractions. Contractions may last for hours, days, or even weeks before true labor sets  in. If contractions come at regular intervals, become more frequent, increase in intensity, or become painful, you should see your health care provider. What are the signs of labor?  Abdominal cramps.  Regular contractions that start at 10 minutes apart and become stronger and more frequent with time.  Contractions that start on the top of the uterus and spread down to the lower abdomen and back.  Increased pelvic pressure and dull back pain.  A watery or bloody mucus discharge that comes from the vagina.  Leaking of amniotic fluid. This is also known as your "water breaking." It could be a slow trickle or a gush. Let your health care provider know if it has a color or strange odor. If you have any of these signs, call your health care provider right away, even if it is before your due date. Follow these instructions at home: Medicines  Follow your health care provider's instructions regarding medicine use. Specific medicines may be either safe or unsafe to take during pregnancy.  Take a prenatal vitamin that contains at least 600 micrograms (mcg) of folic acid.  If you develop constipation, try taking a stool softener if your health care provider approves. Eating and drinking  Eat a balanced diet that includes fresh fruits and vegetables, whole grains, good sources of protein such as meat, eggs, or tofu, and low-fat dairy. Your health care provider will help you determine the amount of weight gain that is right for you.  Avoid raw meat and uncooked cheese. These carry germs that can cause birth defects in the baby.  If you have low calcium intake from food, talk to your health care provider  about whether you should take a daily calcium supplement.  Eat four or five small meals rather than three large meals a day.  Limit foods that are high in fat and processed sugars, such as fried and sweet foods.  To prevent constipation: ? Drink enough fluid to keep your urine clear or pale yellow. ? Eat foods that are high in fiber, such as fresh fruits and vegetables, whole grains, and beans. Activity  Exercise only as directed by your health care provider. Most women can continue their usual exercise routine during pregnancy. Try to exercise for 30 minutes at least 5 days a week. Stop exercising if you experience uterine contractions.  Avoid heavy lifting.  Do not exercise in extreme heat or humidity, or at high altitudes.  Wear low-heel, comfortable shoes.  Practice good posture.  You may continue to have sex unless your health care provider tells you otherwise. Relieving pain and discomfort  Take frequent breaks and rest with your legs elevated if you have leg cramps or low back pain.  Take warm sitz baths to soothe any pain or discomfort caused by hemorrhoids. Use hemorrhoid cream if your health care provider approves.  Wear a good support bra to prevent discomfort from breast tenderness.  If you develop varicose veins: ? Wear support pantyhose or compression stockings as told by your healthcare provider. ? Elevate your feet for 15 minutes, 3-4 times a day. Prenatal care  Write down your questions. Take them to your prenatal visits.  Keep all your prenatal visits as told by your health care provider. This is important. Safety  Wear your seat belt at all times when driving.  Make a list of emergency phone numbers, including numbers for family, friends, the hospital, and police and fire departments. General instructions  Avoid cat litter boxes and soil used by  cats. These carry germs that can cause birth defects in the baby. If you have a cat, ask someone to clean the  litter box for you.  Do not travel far distances unless it is absolutely necessary and only with the approval of your health care provider.  Do not use hot tubs, steam rooms, or saunas.  Do not drink alcohol.  Do not use any products that contain nicotine or tobacco, such as cigarettes and e-cigarettes. If you need help quitting, ask your health care provider.  Do not use any medicinal herbs or unprescribed drugs. These chemicals affect the formation and growth of the baby.  Do not douche or use tampons or scented sanitary pads.  Do not cross your legs for long periods of time.  To prepare for the arrival of your baby: ? Take prenatal classes to understand, practice, and ask questions about labor and delivery. ? Make a trial run to the hospital. ? Visit the hospital and tour the maternity area. ? Arrange for maternity or paternity leave through employers. ? Arrange for family and friends to take care of pets while you are in the hospital. ? Purchase a rear-facing car seat and make sure you know how to install it in your car. ? Pack your hospital bag. ? Prepare the baby's nursery. Make sure to remove all pillows and stuffed animals from the baby's crib to prevent suffocation.  Visit your dentist if you have not gone during your pregnancy. Use a soft toothbrush to brush your teeth and be gentle when you floss. Contact a health care provider if:  You are unsure if you are in labor or if your water has broken.  You become dizzy.  You have mild pelvic cramps, pelvic pressure, or nagging pain in your abdominal area.  You have lower back pain.  You have persistent nausea, vomiting, or diarrhea.  You have an unusual or bad smelling vaginal discharge.  You have pain when you urinate. Get help right away if:  Your water breaks before 37 weeks.  You have regular contractions less than 5 minutes apart before 37 weeks.  You have a fever.  You are leaking fluid from your  vagina.  You have spotting or bleeding from your vagina.  You have severe abdominal pain or cramping.  You have rapid weight loss or weight gain.  You have shortness of breath with chest pain.  You notice sudden or extreme swelling of your face, hands, ankles, feet, or legs.  Your baby makes fewer than 10 movements in 2 hours.  You have severe headaches that do not go away when you take medicine.  You have vision changes. Summary  The third trimester is from week 28 through week 40, months 7 through 9. The third trimester is a time when the unborn baby (fetus) is growing rapidly.  During the third trimester, your discomfort may increase as you and your baby continue to gain weight. You may have abdominal, leg, and back pain, sleeping problems, and an increased need to urinate.  During the third trimester your breasts will keep growing and they will continue to become tender. A yellow fluid (colostrum) may leak from your breasts. This is the first milk you are producing for your baby.  False labor is a condition in which you feel small, irregular tightenings of the muscles in the womb (contractions) that eventually go away. These are called Braxton Hicks contractions. Contractions may last for hours, days, or even weeks before true labor  sets in.  Signs of labor can include: abdominal cramps; regular contractions that start at 10 minutes apart and become stronger and more frequent with time; watery or bloody mucus discharge that comes from the vagina; increased pelvic pressure and dull back pain; and leaking of amniotic fluid. This information is not intended to replace advice given to you by your health care provider. Make sure you discuss any questions you have with your health care provider. Document Released: 08/29/2001 Document Revised: 02/10/2016 Document Reviewed: 11/05/2012 Elsevier Interactive Patient Education  2017 [employer].
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
## 2017-04-02 ENCOUNTER — Encounter (HOSPITAL_COMMUNITY): Payer: Self-pay | Admitting: *Deleted

## 2017-04-02 ENCOUNTER — Inpatient Hospital Stay (HOSPITAL_COMMUNITY)
Admission: AD | Admit: 2017-04-02 | Discharge: 2017-04-02 | Disposition: A | Discharge status: Home / Self Care | Payer: Medicaid Other | Source: Ambulatory Visit | Attending: Obstetrics & Gynecology | Admitting: Obstetrics & Gynecology

## 2017-04-02 DIAGNOSIS — Z3A39 39 weeks gestation of pregnancy: Secondary | ICD-10-CM | POA: Insufficient documentation

## 2017-04-02 DIAGNOSIS — O0933 Supervision of pregnancy with insufficient antenatal care, third trimester: Secondary | ICD-10-CM

## 2017-04-02 DIAGNOSIS — O99210 Obesity complicating pregnancy, unspecified trimester: Secondary | ICD-10-CM

## 2017-04-02 DIAGNOSIS — O471 False labor at or after 37 completed weeks of gestation: Secondary | ICD-10-CM | POA: Insufficient documentation

## 2017-04-02 DIAGNOSIS — O9989 Other specified diseases and conditions complicating pregnancy, childbirth and the puerperium: Secondary | ICD-10-CM

## 2017-04-02 DIAGNOSIS — M549 Dorsalgia, unspecified: Secondary | ICD-10-CM

## 2017-04-02 DIAGNOSIS — O0990 Supervision of high risk pregnancy, unspecified, unspecified trimester: Secondary | ICD-10-CM

## 2017-04-02 NOTE — MAU Note (Signed)
Having a lot of back pain, was having more frequent contractions before she left the house. (not as often now). Has lost more mucous d/c this past wk.  Was a loose 3+ when last checked.

## 2017-04-02 NOTE — MAU Note (Signed)
I have communicated with Dr. Doroteo GlassmanPhelps and reviewed vital signs:  Vitals:   04/02/17 0734 04/02/17 1010  BP: 120/71 120/71  Pulse: 99 95  Resp: 18 18  Temp: 98.2 F (36.8 C)     Vaginal exam:  Dilation: 3.5 Effacement (%): 50 Cervical Position: Posterior Station: -2 Presentation: Vertex Exam by:: Dorrene GermanJ. Rebacca Votaw RN,   Also reviewed contraction pattern and that non-stress test is reactive.  It has been documented that patient is contracting every occasionally with no cervical change over 2 hours not indicating active labor.  Patient denies any other complaints.  Based on this report provider has given order for discharge.  A discharge order and diagnosis entered by a provider.   Labor discharge instructions reviewed with patient.

## 2017-04-02 NOTE — Discharge Instructions (Signed)

## 2017-04-04 ENCOUNTER — Ambulatory Visit (INDEPENDENT_AMBULATORY_CARE_PROVIDER_SITE_OTHER): Payer: Medicaid Other | Admitting: Certified Nurse Midwife

## 2017-04-04 ENCOUNTER — Encounter: Payer: Self-pay | Admitting: Certified Nurse Midwife

## 2017-04-04 VITALS — BP 127/64 | HR 98 | Wt 274.2 lb

## 2017-04-04 DIAGNOSIS — O99820 Streptococcus B carrier state complicating pregnancy: Secondary | ICD-10-CM

## 2017-04-04 DIAGNOSIS — O0990 Supervision of high risk pregnancy, unspecified, unspecified trimester: Secondary | ICD-10-CM

## 2017-04-04 DIAGNOSIS — O0993 Supervision of high risk pregnancy, unspecified, third trimester: Secondary | ICD-10-CM

## 2017-04-04 LAB — POCT URINALYSIS DIP (DEVICE)
BILIRUBIN URINE: NEGATIVE
Glucose, UA: NEGATIVE mg/dL
HGB URINE DIPSTICK: NEGATIVE
Ketones, ur: NEGATIVE mg/dL
Leukocytes, UA: NEGATIVE
NITRITE: NEGATIVE
PH: 7 (ref 5.0–8.0)
Protein, ur: NEGATIVE mg/dL
Specific Gravity, Urine: 1.025 (ref 1.005–1.030)
UROBILINOGEN UA: 1 mg/dL (ref 0.0–1.0)
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
## 2017-04-11 ENCOUNTER — Ambulatory Visit (INDEPENDENT_AMBULATORY_CARE_PROVIDER_SITE_OTHER): Payer: Medicaid Other | Admitting: *Deleted

## 2017-04-11 ENCOUNTER — Encounter (HOSPITAL_COMMUNITY): Payer: Self-pay

## 2017-04-11 ENCOUNTER — Ambulatory Visit: Payer: Self-pay

## 2017-04-11 ENCOUNTER — Ambulatory Visit (INDEPENDENT_AMBULATORY_CARE_PROVIDER_SITE_OTHER): Payer: Medicaid Other | Admitting: Obstetrics and Gynecology

## 2017-04-11 ENCOUNTER — Inpatient Hospital Stay (HOSPITAL_COMMUNITY)
Admission: AD | Admit: 2017-04-11 | Discharge: 2017-04-13 | DRG: 775 | Disposition: A | Discharge status: Home / Self Care | Payer: Medicaid Other | Source: Ambulatory Visit | Attending: Obstetrics & Gynecology | Admitting: Obstetrics & Gynecology

## 2017-04-11 ENCOUNTER — Encounter: Payer: Medicaid Other | Admitting: Advanced Practice Midwife

## 2017-04-11 VITALS — BP 133/74 | HR 97 | Wt 271.6 lb

## 2017-04-11 DIAGNOSIS — O368990 Maternal care for other specified fetal problems, unspecified trimester, not applicable or unspecified: Secondary | ICD-10-CM

## 2017-04-11 DIAGNOSIS — Z3A40 40 weeks gestation of pregnancy: Secondary | ICD-10-CM

## 2017-04-11 DIAGNOSIS — D649 Anemia, unspecified: Secondary | ICD-10-CM | POA: Diagnosis present

## 2017-04-11 DIAGNOSIS — Z3483 Encounter for supervision of other normal pregnancy, third trimester: Secondary | ICD-10-CM

## 2017-04-11 DIAGNOSIS — O0933 Supervision of pregnancy with insufficient antenatal care, third trimester: Secondary | ICD-10-CM

## 2017-04-11 DIAGNOSIS — Z30017 Encounter for initial prescription of implantable subdermal contraceptive: Secondary | ICD-10-CM | POA: Diagnosis not present

## 2017-04-11 DIAGNOSIS — O9902 Anemia complicating childbirth: Secondary | ICD-10-CM | POA: Diagnosis present

## 2017-04-11 DIAGNOSIS — O99214 Obesity complicating childbirth: Secondary | ICD-10-CM | POA: Diagnosis present

## 2017-04-11 DIAGNOSIS — D573 Sickle-cell trait: Secondary | ICD-10-CM | POA: Diagnosis present

## 2017-04-11 DIAGNOSIS — Z87891 Personal history of nicotine dependence: Secondary | ICD-10-CM

## 2017-04-11 DIAGNOSIS — O480 Post-term pregnancy: Secondary | ICD-10-CM

## 2017-04-11 DIAGNOSIS — O0990 Supervision of high risk pregnancy, unspecified, unspecified trimester: Secondary | ICD-10-CM

## 2017-04-11 DIAGNOSIS — Z68.41 Body mass index (BMI) pediatric, 85th percentile to less than 95th percentile for age: Secondary | ICD-10-CM

## 2017-04-11 DIAGNOSIS — O0993 Supervision of high risk pregnancy, unspecified, third trimester: Secondary | ICD-10-CM | POA: Diagnosis not present

## 2017-04-11 DIAGNOSIS — Z3049 Encounter for surveillance of other contraceptives: Secondary | ICD-10-CM | POA: Diagnosis not present

## 2017-04-11 DIAGNOSIS — O99210 Obesity complicating pregnancy, unspecified trimester: Secondary | ICD-10-CM

## 2017-04-11 LAB — CBC
HCT: 35.9 % — ABNORMAL LOW (ref 36.0–46.0)
HEMOGLOBIN: 12.7 g/dL (ref 12.0–15.0)
MCH: 23.9 pg — ABNORMAL LOW (ref 26.0–34.0)
MCHC: 35.4 g/dL (ref 30.0–36.0)
MCV: 67.6 fL — ABNORMAL LOW (ref 78.0–100.0)
PLATELETS: 307 10*3/uL (ref 150–400)
RBC: 5.31 MIL/uL — AB (ref 3.87–5.11)
RDW: 15.7 % — ABNORMAL HIGH (ref 11.5–15.5)
WBC: 8.5 10*3/uL (ref 4.0–10.5)

## 2017-04-11 LAB — TYPE AND SCREEN
ABO/RH(D): B POS
ANTIBODY SCREEN: NEGATIVE

## 2017-04-11 MED ORDER — OXYCODONE-ACETAMINOPHEN 5-325 MG PO TABS
1.0000 | ORAL_TABLET | ORAL | Status: DC | PRN
  Administered 2017-04-12: 1 via ORAL
  Filled 2017-04-11: qty 1

## 2017-04-11 MED ORDER — TERBUTALINE SULFATE 1 MG/ML IJ SOLN
0.2500 mg | Freq: Once | INTRAMUSCULAR | Status: DC | PRN
  Filled 2017-04-11: qty 1

## 2017-04-11 MED ORDER — ACETAMINOPHEN 325 MG PO TABS: 650.0000 mg | ORAL_TABLET | ORAL | Status: DC | PRN

## 2017-04-11 MED ORDER — OXYCODONE-ACETAMINOPHEN 5-325 MG PO TABS
2.0000 | ORAL_TABLET | ORAL | Status: DC | PRN
  Administered 2017-04-12: 2 via ORAL
  Filled 2017-04-11: qty 2

## 2017-04-11 MED ORDER — SOD CITRATE-CITRIC ACID 500-334 MG/5ML PO SOLN: 30.0000 mL | ORAL | Status: DC | PRN

## 2017-04-11 MED ORDER — FENTANYL CITRATE (PF) 100 MCG/2ML IJ SOLN
100.0000 ug | INTRAMUSCULAR | Status: DC | PRN
  Administered 2017-04-11 (×3): 100 ug via INTRAVENOUS
  Filled 2017-04-11 (×3): qty 2

## 2017-04-11 MED ORDER — ONDANSETRON HCL 4 MG/2ML IJ SOLN
4.0000 mg | Freq: Four times a day (QID) | INTRAMUSCULAR | Status: DC | PRN
  Administered 2017-04-11: 4 mg via INTRAVENOUS
  Filled 2017-04-11: qty 2

## 2017-04-11 MED ORDER — FLEET ENEMA 7-19 GM/118ML RE ENEM: 1.0000 | ENEMA | RECTAL | Status: DC | PRN

## 2017-04-11 MED ORDER — LACTATED RINGERS IV SOLN
INTRAVENOUS | Status: DC
  Administered 2017-04-11 – 2017-04-12 (×3): via INTRAVENOUS

## 2017-04-11 MED ORDER — LACTATED RINGERS IV SOLN: 500.0000 mL | INTRAVENOUS | Status: DC | PRN

## 2017-04-11 MED ORDER — OXYTOCIN 40 UNITS IN LACTATED RINGERS INFUSION - SIMPLE MED: 2.5000 [IU]/h | INTRAVENOUS | Status: DC

## 2017-04-11 MED ORDER — OXYTOCIN 40 UNITS IN LACTATED RINGERS INFUSION - SIMPLE MED
1.0000 m[IU]/min | INTRAVENOUS | Status: DC
  Administered 2017-04-11: 2 m[IU]/min via INTRAVENOUS
  Administered 2017-04-11: 4 m[IU]/min via INTRAVENOUS
  Filled 2017-04-11: qty 1000

## 2017-04-11 MED ORDER — OXYTOCIN BOLUS FROM INFUSION: 500.0000 mL | Freq: Once | INTRAVENOUS | Status: DC

## 2017-04-11 MED ORDER — LIDOCAINE HCL (PF) 1 % IJ SOLN
30.0000 mL | INTRAMUSCULAR | Status: DC | PRN
  Filled 2017-04-11: qty 30

## 2017-04-11 NOTE — Progress Notes (Addendum)
Pt informed that the ultrasound is considered a limited OB ultrasound and is not intended to be a complete ultrasound exam.  Patient also informed that the ultrasound is not being completed with the intent of assessing for fetal or placental anomalies or any pelvic abnormalities.  Explained that the purpose of today's ultrasound is to assess for presentation, BPP and amniotic fluid volume.  Patient acknowledges the purpose of the exam and the limitations of the study.     BPP 4/8 (breathing - 0, tone - 0), NST reactive (6/10). Dr. Vergie LivingPickens requested repeat BPP after NST completed - 4/8 (breathing - 0, movement - 0) with Rt arm extended for duration of BPP (30 minutes).   He recommended pt to go to MAU for further evaluation. Report called to attending on-call (Anyanwu) and received orders for direct admit. Induction of labor is planned.  Plan of care explained to pt who agreed and was quite happy with the plan. Pt was taken to MAU for registration.
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
## 2017-04-11 NOTE — Anesthesia Pain Management Evaluation Note (Signed)
  CRNA Pain Management Visit Note  Patient: Kelly Hensley, 21 y.o., female  "Hello I am a member of the anesthesia team at [facility]. We have an anesthesia team available at all times to provide care throughout the hospital, including epidural management and anesthesia for C-section. I don't know your plan for the delivery whether it a natural birth, water birth, IV sedation, nitrous supplementation, doula or epidural, but we want to meet your pain goals."   1.Was your pain managed to your expectations on prior hospitalizations?   Yes   2.What is your expectation for pain management during this hospitalization?     Epidural  3.How can we help you reach that goal?   Record the patient's initial score and the patient's pain goal.   Pain: 5  Pain Goal: 6 [facility] wants you to be able to say your pain was always managed very well.  Kelly Hensley,Kelly Hensley 04/11/2017

## 2017-04-11 NOTE — Progress Notes (Signed)
IOL scheduled on 7/27 @ 0700

## 2017-04-11 NOTE — Progress Notes (Signed)
Patient ID: [name], female   DOB: 01-Nov-1995, 21 y.o.   MRN: [id number] Feeling some mild cramping VSS, afeb FHR 120s, +accels, occ mi variables Ctx q 2-3 mins w/ Pit @ 7316mu/min Cx 3+/60/-2; AROM for clear fluid  IUP@term  BPP 6/10 Cx favorable  Continue to keep ctx regular to achieve adequate labor Epidural when desires  Cam HaiSHAW, Ann Bohne CNM 04/11/2017 11:28 PM
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
## 2017-04-12 ENCOUNTER — Inpatient Hospital Stay (HOSPITAL_COMMUNITY): Payer: Medicaid Other | Admitting: Anesthesiology

## 2017-04-12 ENCOUNTER — Encounter (HOSPITAL_COMMUNITY): Payer: Self-pay

## 2017-04-12 DIAGNOSIS — Z3A40 40 weeks gestation of pregnancy: Secondary | ICD-10-CM

## 2017-04-12 LAB — RPR: RPR Ser Ql: NONREACTIVE

## 2017-04-12 MED ORDER — LIDOCAINE HCL (PF) 1 % IJ SOLN
INTRAMUSCULAR | Status: DC | PRN
  Administered 2017-04-12 (×2): 5 mL via EPIDURAL

## 2017-04-12 MED ORDER — ZOLPIDEM TARTRATE 5 MG PO TABS: 5.0000 mg | ORAL_TABLET | Freq: Every evening | ORAL | Status: DC | PRN

## 2017-04-12 MED ORDER — DIPHENHYDRAMINE HCL 50 MG/ML IJ SOLN: 12.5000 mg | INTRAMUSCULAR | Status: DC | PRN

## 2017-04-12 MED ORDER — ONDANSETRON HCL 4 MG/2ML IJ SOLN: 4.0000 mg | INTRAMUSCULAR | Status: DC | PRN

## 2017-04-12 MED ORDER — PHENYLEPHRINE 40 MCG/ML (10ML) SYRINGE FOR IV PUSH (FOR BLOOD PRESSURE SUPPORT)
80.0000 ug | PREFILLED_SYRINGE | INTRAVENOUS | Status: DC | PRN
Start: 2017-04-12 — End: 2017-04-12
  Filled 2017-04-12: qty 10
  Filled 2017-04-12: qty 5

## 2017-04-12 MED ORDER — IBUPROFEN 600 MG PO TABS: 600.0000 mg | ORAL_TABLET | Freq: Four times a day (QID) | ORAL | 0 refills | Status: DC

## 2017-04-12 MED ORDER — SENNOSIDES-DOCUSATE SODIUM 8.6-50 MG PO TABS
2.0000 | ORAL_TABLET | ORAL | Status: DC
  Administered 2017-04-12: 2 via ORAL
  Filled 2017-04-12: qty 2

## 2017-04-12 MED ORDER — COCONUT OIL OIL: 1.0000 "application " | TOPICAL_OIL | Status: DC | PRN

## 2017-04-12 MED ORDER — IBUPROFEN 600 MG PO TABS
600.0000 mg | ORAL_TABLET | Freq: Four times a day (QID) | ORAL | Status: DC
  Administered 2017-04-12 – 2017-04-13 (×6): 600 mg via ORAL
  Filled 2017-04-12 (×6): qty 1

## 2017-04-12 MED ORDER — ONDANSETRON HCL 4 MG PO TABS: 4.0000 mg | ORAL_TABLET | ORAL | Status: DC | PRN

## 2017-04-12 MED ORDER — PRENATAL MULTIVITAMIN CH
1.0000 | ORAL_TABLET | Freq: Every day | ORAL | Status: DC
  Administered 2017-04-12 – 2017-04-13 (×2): 1 via ORAL
  Filled 2017-04-12 (×2): qty 1

## 2017-04-12 MED ORDER — EPHEDRINE 5 MG/ML INJ
10.0000 mg | INTRAVENOUS | Status: DC | PRN
  Filled 2017-04-12: qty 2

## 2017-04-12 MED ORDER — ACETAMINOPHEN 325 MG PO TABS
650.0000 mg | ORAL_TABLET | ORAL | Status: DC | PRN
  Administered 2017-04-12 – 2017-04-13 (×3): 650 mg via ORAL
  Filled 2017-04-12 (×3): qty 2

## 2017-04-12 MED ORDER — DIBUCAINE 1 % RE OINT: 1.0000 "application " | TOPICAL_OINTMENT | RECTAL | Status: DC | PRN

## 2017-04-12 MED ORDER — DIPHENHYDRAMINE HCL 25 MG PO CAPS: 25.0000 mg | ORAL_CAPSULE | Freq: Four times a day (QID) | ORAL | Status: DC | PRN

## 2017-04-12 MED ORDER — LACTATED RINGERS IV SOLN
500.0000 mL | Freq: Once | INTRAVENOUS | Status: AC
  Administered 2017-04-12: 500 mL via INTRAVENOUS

## 2017-04-12 MED ORDER — PRENATAL VITAMINS 0.8 MG PO TABS: 1.0000 | ORAL_TABLET | Freq: Every day | ORAL | 12 refills | Status: DC

## 2017-04-12 MED ORDER — TETANUS-DIPHTH-ACELL PERTUSSIS 5-2.5-18.5 LF-MCG/0.5 IM SUSP: 0.5000 mL | Freq: Once | INTRAMUSCULAR | Status: DC

## 2017-04-12 MED ORDER — ETONOGESTREL 68 MG ~~LOC~~ IMPL
68.0000 mg | DRUG_IMPLANT | Freq: Once | SUBCUTANEOUS | Status: AC
  Administered 2017-04-13: 68 mg via SUBCUTANEOUS
  Filled 2017-04-12: qty 1

## 2017-04-12 MED ORDER — FENTANYL 2.5 MCG/ML BUPIVACAINE 1/10 % EPIDURAL INFUSION (WH - ANES)
14.0000 mL/h | INTRAMUSCULAR | Status: DC | PRN
  Administered 2017-04-12: 14 mL/h via EPIDURAL
  Filled 2017-04-12: qty 100

## 2017-04-12 MED ORDER — SIMETHICONE 80 MG PO CHEW: 80.0000 mg | CHEWABLE_TABLET | ORAL | Status: DC | PRN

## 2017-04-12 MED ORDER — PHENYLEPHRINE 40 MCG/ML (10ML) SYRINGE FOR IV PUSH (FOR BLOOD PRESSURE SUPPORT)
80.0000 ug | PREFILLED_SYRINGE | INTRAVENOUS | Status: DC | PRN
  Filled 2017-04-12: qty 5

## 2017-04-12 MED ORDER — WITCH HAZEL-GLYCERIN EX PADS: 1.0000 "application " | MEDICATED_PAD | CUTANEOUS | Status: DC | PRN

## 2017-04-12 MED ORDER — BENZOCAINE-MENTHOL 20-0.5 % EX AERO
1.0000 "application " | INHALATION_SPRAY | CUTANEOUS | Status: DC | PRN
  Filled 2017-04-12 (×2): qty 56

## 2017-04-12 MED ORDER — LIDOCAINE HCL 1 % IJ SOLN
0.0000 mL | Freq: Once | INTRAMUSCULAR | Status: DC | PRN
  Filled 2017-04-12: qty 20

## 2017-04-12 NOTE — Progress Notes (Signed)
Dr [place] Shirley notified that pt complains of pain of 8 abdomen cramping and back aching. Pt requests 2 percocets and uses k-pad intermittently. Assessment is WNL.

## 2017-04-12 NOTE — Anesthesia Procedure Notes (Signed)
Epidural Patient location during procedure: OB Start time: 04/12/2017 1:47 AM End time: 04/12/2017 1:04 AM  Staffing Anesthesiologist: Heather RobertsSINGER, Vercie Pokorny Performed: anesthesiologist   Preanesthetic Checklist Completed: patient identified, site marked, pre-op evaluation, timeout performed, IV checked, risks and benefits discussed and monitors and equipment checked  Epidural Patient position: sitting Prep: DuraPrep Patient monitoring: heart rate, cardiac monitor, continuous pulse ox and blood pressure Approach: midline Location: L2-L3 Injection technique: LOR saline  Needle:  Needle type: Tuohy  Needle gauge: 17 G Needle length: 9 cm Needle insertion depth: 9 cm Catheter size: 20 Guage Catheter at skin depth: 14 cm Test dose: negative and Other  Assessment Events: blood not aspirated, injection not painful, no injection resistance and negative IV test  Additional Notes Informed consent obtained prior to proceeding including risk of failure, 1% risk of PDPH, risk of minor discomfort and bruising.  Discussed rare but serious complications including epidural abscess, permanent nerve injury, epidural hematoma.  Discussed alternatives to epidural analgesia and patient desires to proceed.  Timeout performed pre-procedure verifying patient name, procedure, and platelet count.  Patient tolerated procedure well.

## 2017-04-12 NOTE — Discharge Instructions (Signed)
Breastfeeding °Deciding to breastfeed is one of the best choices you can make for you and your baby. A change in hormones during pregnancy causes your breast tissue to grow and increases the number and size of your milk ducts. These hormones also allow proteins, sugars, and fats from your blood supply to make breast milk in your milk-producing glands. Hormones prevent breast milk from being released before your baby is born as well as prompt milk flow after birth. Once breastfeeding has begun, thoughts of your baby, as well as his or her sucking or crying, can stimulate the release of milk from your milk-producing glands. °Benefits of breastfeeding °For Your Baby °· Your first milk (colostrum) helps your baby's digestive system function better. °· There are antibodies in your milk that help your baby fight off infections. °· Your baby has a lower incidence of asthma, allergies, and sudden infant death syndrome. °· The nutrients in breast milk are better for your baby than infant formulas and are designed uniquely for your baby’s needs. °· Breast milk improves your baby's brain development. °· Your baby is less likely to develop other conditions, such as childhood obesity, asthma, or type 2 diabetes mellitus. ° °For You °· Breastfeeding helps to create a very special bond between you and your baby. °· Breastfeeding is convenient. Breast milk is always available at the correct temperature and costs nothing. °· Breastfeeding helps to burn calories and helps you lose the weight gained during pregnancy. °· Breastfeeding makes your uterus contract to its prepregnancy size faster and slows bleeding (lochia) after you give birth. °· Breastfeeding helps to lower your risk of developing type 2 diabetes mellitus, osteoporosis, and breast or ovarian cancer later in life. ° °Signs that your baby is hungry °Early Signs of Hunger °· Increased alertness or activity. °· Stretching. °· Movement of the head from side to  side. °· Movement of the head and opening of the mouth when the corner of the mouth or cheek is stroked (rooting). °· Increased sucking sounds, smacking lips, cooing, sighing, or squeaking. °· Hand-to-mouth movements. °· Increased sucking of fingers or hands. ° °Late Signs of Hunger °· Fussing. °· Intermittent crying. ° °Extreme Signs of Hunger °Signs of extreme hunger will require calming and consoling before your baby will be able to breastfeed successfully. Do not wait for the following signs of extreme hunger to occur before you initiate breastfeeding: °· Restlessness. °· A loud, strong cry. °· Screaming. ° °Breastfeeding basics °Breastfeeding Initiation °· Find a comfortable place to sit or lie down, with your neck and back well supported. °· Place a pillow or rolled up blanket under your baby to bring him or her to the level of your breast (if you are seated). Nursing pillows are specially designed to help support your arms and your baby while you breastfeed. °· Make sure that your baby's abdomen is facing your abdomen. °· Gently massage your breast. With your fingertips, massage from your chest wall toward your nipple in a circular motion. This encourages milk flow. You may need to continue this action during the feeding if your milk flows slowly. °· Support your breast with 4 fingers underneath and your thumb above your nipple. Make sure your fingers are well away from your nipple and your baby’s mouth. °· Stroke your baby's lips gently with your finger or nipple. °· When your baby's mouth is open wide enough, quickly bring your baby to your breast, placing your entire nipple and as much of the colored area   around your nipple (areola) as possible into your baby's mouth. °? More areola should be visible above your baby's upper lip than below the lower lip. °? Your baby's tongue should be between his or her lower gum and your breast. °· Ensure that your baby's mouth is correctly positioned around your nipple  (latched). Your baby's lips should create a seal on your breast and be turned out (everted). °· It is common for your baby to suck about 2-3 minutes in order to start the flow of breast milk. ° °Latching °Teaching your baby how to latch on to your breast properly is very important. An improper latch can cause nipple pain and decreased milk supply for you and poor weight gain in your baby. Also, if your baby is not latched onto your nipple properly, he or she may swallow some air during feeding. This can make your baby fussy. Burping your baby when you switch breasts during the feeding can help to get rid of the air. However, teaching your baby to latch on properly is still the best way to prevent fussiness from swallowing air while breastfeeding. °Signs that your baby has successfully latched on to your nipple: °· Silent tugging or silent sucking, without causing you pain. °· Swallowing heard between every 3-4 sucks. °· Muscle movement above and in front of his or her ears while sucking. ° °Signs that your baby has not successfully latched on to nipple: °· Sucking sounds or smacking sounds from your baby while breastfeeding. °· Nipple pain. ° °If you think your baby has not latched on correctly, slip your finger into the corner of your baby’s mouth to break the suction and place it between your baby's gums. Attempt breastfeeding initiation again. °Signs of Successful Breastfeeding °Signs from your baby: °· A gradual decrease in the number of sucks or complete cessation of sucking. °· Falling asleep. °· Relaxation of his or her body. °· Retention of a small amount of milk in his or her mouth. °· Letting go of your breast by himself or herself. ° °Signs from you: °· Breasts that have increased in firmness, weight, and size 1-3 hours after feeding. °· Breasts that are softer immediately after breastfeeding. °· Increased milk volume, as well as a change in milk consistency and color by the fifth day of  breastfeeding. °· Nipples that are not sore, cracked, or bleeding. ° °Signs That Your Baby is Getting Enough Milk °· Wetting at least 1-2 diapers during the first 24 hours after birth. °· Wetting at least 5-6 diapers every 24 hours for the first week after birth. The urine should be clear or pale yellow by 5 days after birth. °· Wetting 6-8 diapers every 24 hours as your baby continues to grow and develop. °· At least 3 stools in a 24-hour period by age 5 days. The stool should be soft and yellow. °· At least 3 stools in a 24-hour period by age 7 days. The stool should be seedy and yellow. °· No loss of weight greater than 10% of birth weight during the first 3 days of age. °· Average weight gain of 4-7 ounces (113-198 g) per week after age 4 days. °· Consistent daily weight gain by age 5 days, without weight loss after the age of 2 weeks. ° °After a feeding, your baby may spit up a small amount. This is common. °Breastfeeding frequency and duration °Frequent feeding will help you make more milk and can prevent sore nipples and breast engorgement. Breastfeed when   you feel the need to reduce the fullness of your breasts or when your baby shows signs of hunger. This is called "breastfeeding on demand." Avoid introducing a pacifier to your baby while you are working to establish breastfeeding (the first 4-6 weeks after your baby is born). After this time you may choose to use a pacifier. Research has shown that pacifier use during the first year of a baby's life decreases the risk of sudden infant death syndrome (SIDS). °Allow your baby to feed on each breast as long as he or she wants. Breastfeed until your baby is finished feeding. When your baby unlatches or falls asleep while feeding from the first breast, offer the second breast. Because newborns are often sleepy in the first few weeks of life, you may need to awaken your baby to get him or her to feed. °Breastfeeding times will vary from baby to baby. However,  the following rules can serve as a guide to help you ensure that your baby is properly fed: °· Newborns (babies 4 weeks of age or younger) may breastfeed every 1-3 hours. °· Newborns should not go longer than 3 hours during the day or 5 hours during the night without breastfeeding. °· You should breastfeed your baby a minimum of 8 times in a 24-hour period until you begin to introduce solid foods to your baby at around 6 months of age. ° °Breast milk pumping °Pumping and storing breast milk allows you to ensure that your baby is exclusively fed your breast milk, even at times when you are unable to breastfeed. This is especially important if you are going back to work while you are still breastfeeding or when you are not able to be present during feedings. Your lactation consultant can give you guidelines on how long it is safe to store breast milk. °A breast pump is a machine that allows you to pump milk from your breast into a sterile bottle. The pumped breast milk can then be stored in a refrigerator or freezer. Some breast pumps are operated by hand, while others use electricity. Ask your lactation consultant which type will work best for you. Breast pumps can be purchased, but some hospitals and breastfeeding support groups lease breast pumps on a monthly basis. A lactation consultant can teach you how to hand express breast milk, if you prefer not to use a pump. °Caring for your breasts while you breastfeed °Nipples can become dry, cracked, and sore while breastfeeding. The following recommendations can help keep your breasts moisturized and healthy: °· Avoid using soap on your nipples. °· Wear a supportive bra. Although not required, special nursing bras and tank tops are designed to allow access to your breasts for breastfeeding without taking off your entire bra or top. Avoid wearing underwire-style bras or extremely tight bras. °· Air dry your nipples for 3-4 minutes after each feeding. °· Use only cotton  bra pads to absorb leaked breast milk. Leaking of breast milk between feedings is normal. °· Use lanolin on your nipples after breastfeeding. Lanolin helps to maintain your skin's normal moisture barrier. If you use pure lanolin, you do not need to wash it off before feeding your baby again. Pure lanolin is not toxic to your baby. You may also hand express a few drops of breast milk and gently massage that milk into your nipples and allow the milk to air dry. ° °In the first few weeks after giving birth, some women experience extremely full breasts (engorgement). Engorgement can make your   breasts feel heavy, warm, and tender to the touch. Engorgement peaks within 3-5 days after you give birth. The following recommendations can help ease engorgement: °· Completely empty your breasts while breastfeeding or pumping. You may want to start by applying warm, moist heat (in the shower or with warm water-soaked hand towels) just before feeding or pumping. This increases circulation and helps the milk flow. If your baby does not completely empty your breasts while breastfeeding, pump any extra milk after he or she is finished. °· Wear a snug bra (nursing or regular) or tank top for 1-2 days to signal your body to slightly decrease milk production. °· Apply ice packs to your breasts, unless this is too uncomfortable for you. °· Make sure that your baby is latched on and positioned properly while breastfeeding. ° °If engorgement persists after 48 hours of following these recommendations, contact your health care provider or a lactation consultant. °Overall health care recommendations while breastfeeding °· Eat healthy foods. Alternate between meals and snacks, eating 3 of each per day. Because what you eat affects your breast milk, some of the foods may make your baby more irritable than usual. Avoid eating these foods if you are sure that they are negatively affecting your baby. °· Drink milk, fruit juice, and water to  satisfy your thirst (about 10 glasses a day). °· Rest often, relax, and continue to take your prenatal vitamins to prevent fatigue, stress, and anemia. °· Continue breast self-awareness checks. °· Avoid chewing and smoking tobacco. Chemicals from cigarettes that pass into breast milk and exposure to secondhand smoke may harm your baby. °· Avoid alcohol and drug use, including marijuana. °Some medicines that may be harmful to your baby can pass through breast milk. It is important to ask your health care provider before taking any medicine, including all over-the-counter and prescription medicine as well as vitamin and herbal supplements. °It is possible to become pregnant while breastfeeding. If birth control is desired, ask your health care provider about options that will be safe for your baby. °Contact a health care provider if: °· You feel like you want to stop breastfeeding or have become frustrated with breastfeeding. °· You have painful breasts or nipples. °· Your nipples are cracked or bleeding. °· Your breasts are red, tender, or warm. °· You have a swollen area on either breast. °· You have a fever or chills. °· You have nausea or vomiting. °· You have drainage other than breast milk from your nipples. °· Your breasts do not become full before feedings by the fifth day after you give birth. °· You feel sad and depressed. °· Your baby is too sleepy to eat well. °· Your baby is having trouble sleeping. °· Your baby is wetting less than 3 diapers in a 24-hour period. °· Your baby has less than 3 stools in a 24-hour period. °· Your baby's skin or the white part of his or her eyes becomes yellow. °· Your baby is not gaining weight by 5 days of age. °Get help right away if: °· Your baby is overly tired (lethargic) and does not want to wake up and feed. °· Your baby develops an unexplained fever. °This information is not intended to replace advice given to you by your health care provider. Make sure you discuss  any questions you have with your health care provider. °Document Released: 09/04/2005 Document Revised: 02/16/2016 Document Reviewed: 02/26/2013 °[employer]vier Interactive Patient Education © 2017 [employer] °Home Care Instructions for Mom °ACTIVITY °·   Gradually return to your regular activities. °· Let yourself rest. Nap while your baby sleeps. °· Avoid lifting anything that is heavier than 10 lb (4.5 kg) until your health care provider says it is okay. °· Avoid activities that take a lot of effort and energy (are strenuous) until approved by your health care provider. Walking at a slow-to-moderate pace is usually safe. °· If you had a cesarean delivery: °? Do not vacuum, climb stairs, or drive a car for 4-6 weeks. °? Have someone help you at home until you feel like you can do your usual activities yourself. °? Do exercises as told by your health care provider, if this applies. ° °VAGINAL BLEEDING °You may continue to bleed for 4-6 weeks after delivery. Over time, the amount of blood usually decreases and the color of the blood usually gets lighter. However, the flow of bright red blood may increase if you have been too active. If you need to use more than one pad in an hour because your pad gets soaked, or if you pass a large clot: °· Lie down. °· Raise your feet. °· Place a cold compress on your lower abdomen. °· Rest. °· Call your health care provider. ° °If you are breastfeeding, your period should return anytime between 8 weeks after delivery and the time that you stop breastfeeding. If you are not breastfeeding, your period should return 6-8 weeks after delivery. °PERINEAL CARE °The perineal area, or perineum, is the part of your body between your thighs. After delivery, this area needs special care. Follow these instructions as told by your health care provider. °· Take warm tub baths for 15-20 minutes. °· Use medicated pads and pain-relieving sprays and creams as told. °· Do not use tampons or douches until  vaginal bleeding has stopped. °· Each time you go to the bathroom: °? Use a peri bottle. °? Change your pad. °? Use towelettes in place of toilet paper until your stitches have healed. °· Do Kegel exercises every day. Kegel exercises help to maintain the muscles that support the vagina, bladder, and bowels. You can do these exercises while you are standing, sitting, or lying down. To do Kegel exercises: °? Tighten the muscles of your abdomen and the muscles that surround your birth canal. °? Hold for a few seconds. °? Relax. °? Repeat until you have done this 5 times in a row. °· To prevent hemorrhoids from developing or getting worse: °? Drink enough fluid to keep your urine clear or pale yellow. °? Avoid straining when having a bowel movement. °? Take over-the-counter medicines and stool softeners as told by your health care provider. ° °BREAST CARE °· Wear a tight-fitting bra. °· Avoid taking over-the-counter pain medicine for breast discomfort. °· Apply ice to the breasts to help with discomfort as needed: °? Put ice in a plastic bag. °? Place a towel between your skin and the bag. °? Leave the ice on for 20 minutes or as told by your health care provider. ° °NUTRITION °· Eat a well-balanced diet. °· Do not try to lose weight quickly by cutting back on calories. °· Take your prenatal vitamins until your postpartum checkup or until your health care provider tells you to stop. ° °POSTPARTUM DEPRESSION °You may find yourself crying for no apparent reason and unable to cope with all of the changes that come with having a newborn. This mood is called postpartum depression. Postpartum depression happens because your hormone levels change after delivery. If you have   postpartum depression, get support from your partner, friends, and family. If the depression does not go away on its own after several weeks, contact your health care provider. °BREAST SELF-EXAM °Do a breast self-exam each month, at the same time of the  month. If you are breastfeeding, check your breasts just after a feeding, when your breasts are less full. If you are breastfeeding and your period has started, check your breasts on day 5, 6, or 7 of your period. °Report any lumps, bumps, or discharge to your health care provider. Know that breasts are normally lumpy if you are breastfeeding. This is temporary, and it is not a health risk. °INTIMACY AND SEXUALITY °Avoid sexual activity for at least 3-4 weeks after delivery or until the brownish-red vaginal flow is completely gone. If you want to avoid pregnancy, use some form of birth control. You can get pregnant after delivery, even if you have not had your period. °SEEK MEDICAL CARE IF: °· You feel unable to cope with the changes that a child brings to your life, and these feelings do not go away after several weeks. °· You notice a lump, a bump, or discharge on your breast. ° °SEEK IMMEDIATE MEDICAL CARE IF: °· Blood soaks your pad in 1 hour or less. °· You have: °? Severe pain or cramping in your lower abdomen. °? A bad-smelling vaginal discharge. °? A fever that is not controlled by medicine. °? A fever, and an area of your breast is red and sore. °? Pain or redness in your calf. °? Sudden, severe chest pain. °? Shortness of breath. °? Painful or bloody urination. °? Problems with your vision. °· You vomit for 12 hours or longer. °· You develop a severe headache. °· You have serious thoughts about hurting yourself, your child, or anyone else. ° °This information is not intended to replace advice given to you by your health care provider. Make sure you discuss any questions you have with your health care provider. °Document Released: 09/01/2000 Document Revised: 02/10/2016 Document Reviewed: 03/08/2015 °[employer]vier Interactive Patient Education © 2017 [employer] ° °

## 2017-04-12 NOTE — Anesthesia Postprocedure Evaluation (Signed)
Anesthesia Post Note  Patient: Kelly Hensley  Procedure(s) Performed: * No procedures listed *     Patient location during evaluation: Mother Baby Anesthesia Type: Epidural Level of consciousness: awake, awake and alert, oriented and patient cooperative Pain management: pain level controlled Vital Signs Assessment: post-procedure vital signs reviewed and stable Respiratory status: spontaneous breathing, nonlabored ventilation and respiratory function stable Cardiovascular status: stable Postop Assessment: no headache, no backache, patient able to bend at knees and no signs of nausea or vomiting Anesthetic complications: no    Last Vitals:  Vitals:   04/12/17 0409 04/12/17 0509  BP: (!) 111/53 (!) 110/52  Pulse: 64 61  Resp: 17 16  Temp: 36.9 C 37 C    Last Pain:  Vitals:   04/12/17 0725  TempSrc:   PainSc: 4    Pain Goal: Patients Stated Pain Goal: 2 (04/12/17 0725)               Dondi Aime L

## 2017-04-12 NOTE — Progress Notes (Signed)
Post Partum Day #1/2 Subjective: no complaints, up ad lib, voiding, tolerating PO and reports normal lochia  Objective: Blood pressure (!) 110/52, pulse 61, temperature 98.6 F (37 C), temperature source Oral, resp. rate 16, height 5\' 7"  (1.702 m), weight 122.9 kg (271 lb), last menstrual period 06/30/2016, SpO2 100 %, unknown if currently breastfeeding.  Physical Exam:  General: alert Lochia: appropriate Uterine Fundus: firm and NT at U DVT Evaluation: No evidence of DVT seen on physical exam.   Recent Labs  04/11/17 1713  HGB 12.7  HCT 35.9*    Assessment/Plan: Plan for discharge tomorrow  Plan for Nexplanon today Breastfeeding   LOS: 1 day   Likisha Alles C Pasty Manninen 04/12/2017, 6:59 AM

## 2017-04-12 NOTE — Anesthesia Preprocedure Evaluation (Signed)
Anesthesia Evaluation  Patient identified by MRN, date of birth, ID band Patient awake    Reviewed: Allergy & Precautions, NPO status , Patient's Chart, lab work & pertinent test results  Airway Mallampati: III  TM Distance: >3 FB Neck ROM: Full    Dental  (+) Teeth Intact, Dental Advisory Given   Pulmonary former smoker,    Pulmonary exam normal breath sounds clear to auscultation       Cardiovascular negative cardio ROS Normal cardiovascular exam Rhythm:Regular Rate:Normal     Neuro/Psych  Headaches, negative psych ROS   GI/Hepatic negative GI ROS, Neg liver ROS,   Endo/Other  Morbid obesity  Renal/GU negative Renal ROS     Musculoskeletal negative musculoskeletal ROS (+)   Abdominal   Peds  Hematology  (+) Blood dyscrasia, Sickle cell trait and anemia , Plt 332k   Anesthesia Other Findings Day of surgery medications reviewed with the patient.  Reproductive/Obstetrics (+) Pregnancy                             Anesthesia Physical  Anesthesia Plan  ASA: III  Anesthesia Plan: Epidural   Post-op Pain Management:    Induction:   PONV Risk Score and Plan:   Airway Management Planned: Natural Airway  Additional Equipment:   Intra-op Plan:   Post-operative Plan:   Informed Consent: I have reviewed the patients History and Physical, chart, labs and discussed the procedure including the risks, benefits and alternatives for the proposed anesthesia with the patient or authorized representative who has indicated his/her understanding and acceptance.   Dental advisory given  Plan Discussed with: Anesthesiologist  Anesthesia Plan Comments:         Anesthesia Quick Evaluation

## 2017-04-12 NOTE — Lactation Note (Signed)
This note was copied from a baby's chart. Lactation Consultation Note: Mother reports that she breastfed her first child for 5 months. Mother has breastfed 3-4 times in 12 hours. Mother reports that infant is feeding well. Mother having pain from uterine cramps at this time. Mother reports that infant just finished feeding for 10 min. Mother advised to continue to cue base feed infant and at least 8-12 times in 24 hours. [facility] services. 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 She states [facility] when she goes back to school soon,   Patient Name: Kelly Hensley ZOXWR'UToday's Date: 04/12/2017 Reason for consult: Initial assessment   Maternal Data Has patient been taught Hand Expression?: Yes Does the patient have breastfeeding experience prior to this delivery?: Yes  Feeding Feeding Type: Breast Fed Length of feed: 10 min  LATCH Score/Interventions Latch: Grasps breast easily, tongue down, lips flanged, rhythmical sucking.  Audible Swallowing: A few with stimulation Intervention(s): Skin to skin;Hand expression  Type of Nipple: Everted at rest and after stimulation  Comfort (Breast/Nipple): Soft / non-tender     Hold (Positioning): Full assist, staff holds infant at breast  LATCH Score: 7  Lactation Tools Discussed/Used     Consult Status Consult Status: Follow-up Date: 04/12/17 Follow-up type: In-patient    Stevan BornKendrick, Byanka Landrus [facility] 04/12/2017, 2:47 PM

## 2017-04-12 NOTE — Plan of Care (Signed)
Problem: Urinary Elimination: Goal: Ability to reestablish a normal urinary elimination pattern will improve Outcome: Completed/Met Date Met: 04/12/17 Patient is voiding with no issues.

## 2017-04-13 ENCOUNTER — Inpatient Hospital Stay (HOSPITAL_COMMUNITY): Admission: RE | Admit: 2017-04-13 | Payer: Medicaid Other | Source: Ambulatory Visit

## 2017-04-13 DIAGNOSIS — Z3049 Encounter for surveillance of other contraceptives: Secondary | ICD-10-CM
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
## 2017-04-13 NOTE — Progress Notes (Signed)
Post Partum Day #1 1/2 Subjective: no complaints, up ad lib, voiding, tolerating PO and unsure if she wants to stay until tomorrow morning. Will let her nurse know.  Objective: Blood pressure (!) 113/58, pulse 71, temperature 97.8 F (36.6 C), temperature source Oral, resp. rate 14, height 5\' 7"  (1.702 m), weight 122.9 kg (271 lb), last menstrual period 06/30/2016, SpO2 100 %, unknown if currently breastfeeding.  Physical Exam:  General: alert Lochia: appropriate Uterine Fundus: firm DVT Evaluation: No evidence of DVT seen on physical exam.   Recent Labs  04/11/17 1713  HGB 12.7  HCT 35.9*    Assessment/Plan: Plan for discharge tomorrow if she doesn't decide to go home today Nexplanon today   LOS: 2 days   Allie BossierMyra C Arnita Koons 04/13/2017, 6:53 AM
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
## 2017-04-13 NOTE — Procedures (Signed)
PROCEDURE NOTE: Nexplanon Insertion  Patient given informed consent, signed copy in the chart, time out was performed.  Post-partum Nexplanon insertion after NSVD  Patient's right arm was prepped and draped in the usual sterile fashion. The ruler used to measure and mark insertion area. Pt was prepped with alcohol swab and then injected with 5 cc of  1% lidocaine with epinephrine used to anesthetize the area.  Pt was prepped with betadine, nexplanon removed from packaging, device confirmed in needle, then inserted full length of needle and withdrawn per manufacturer's instructions. Minimal blood loss. The insertion site covered with antibiotic ointment and a pressure bandage to minimize bruising. There were no complications and the patient tolerated the procedure well.  Device information was given in handout form. Patient is informed the removal date will be in three years and package insert card filled out and given to her.  Caryl AdaJazma Daimon Kean, DO OB Fellow Faculty Practice, [facility] [place] 04/13/2017, 10:38 AM

## 2017-04-13 NOTE — Lactation Note (Signed)
This note was copied from a baby's chart. Lactation Consultation Note  Patient Name: Boy Lady Garyniya Firmin ZOXWR'UToday's Date: 04/13/2017 Reason for consult: Follow-up assessment Baby at 35 hr of life and dyad set for d/c. Mom reports baby is latching well. She denies breast or nipple pain. She requested a pump to take home because she has not made a WIC "yet". Discussed baby behavior, feeding frequency, baby belly size, voids, wt loss, breast changes, and nipple care. Mom stated she can manually express and knows how to spoon feed. Parents are aware of lactation services and support group. They will call as needed.    Maternal Data    Feeding Feeding Type: Breast Fed Length of feed: 30 min  LATCH Score/Interventions Latch: Grasps breast easily, tongue down, lips flanged, rhythmical sucking.  Audible Swallowing: A few with stimulation Intervention(s): Skin to skin;Hand expression  Type of Nipple: Everted at rest and after stimulation  Comfort (Breast/Nipple): Soft / non-tender     Hold (Positioning): No assistance needed to correctly position infant at breast.  LATCH Score: 9  Lactation Tools Discussed/Used Pump Review: Setup, frequency, and cleaning;Milk Storage Initiated by:: ES Date initiated:: 04/13/17   Consult Status Consult Status: Follow-up Date: 04/14/17 Follow-up type: In-patient    Rulon Eisenmengerlizabeth E Chante Mayson 04/13/2017, 1:39 PM

## 2017-04-27 ENCOUNTER — Encounter: Payer: Self-pay | Admitting: General Practice

## 2017-05-24 ENCOUNTER — Encounter: Payer: Self-pay | Admitting: Obstetrics and Gynecology

## 2017-05-24 ENCOUNTER — Ambulatory Visit: Payer: Medicaid Other | Admitting: Obstetrics and Gynecology

## 2017-08-07 IMAGING — US US PELVIS COMPLETE
1 series · 13 of 25 positions shown · non-contrast
Comparison: 12/21/2014

CLINICAL DATA: Status post delivery 4 months ago. Lower pelvic pain
is sharp and stabbing. Symptoms for 1 week and worse today. LMP
06/12/2016.

EXAM:
TRANSABDOMINAL AND TRANSVAGINAL ULTRASOUND OF PELVIS
TECHNIQUE: Both transabdominal and transvaginal ultrasound examinations of the
pelvis were performed. Transabdominal technique was performed for
global imaging of the pelvis including uterus, ovaries, adnexal
regions, and pelvic cul-de-sac. It was necessary to proceed with
endovaginal exam following the transabdominal exam to visualize the
endometrium and ovaries

[Series 1: us pelvis complete · 0.24mm/px · 13 of 61 slices shown]
[im 1/61]
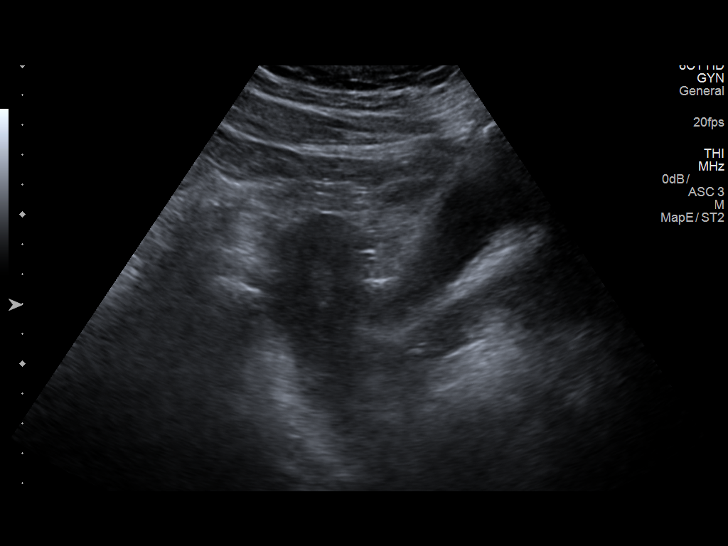
[im 6/61]
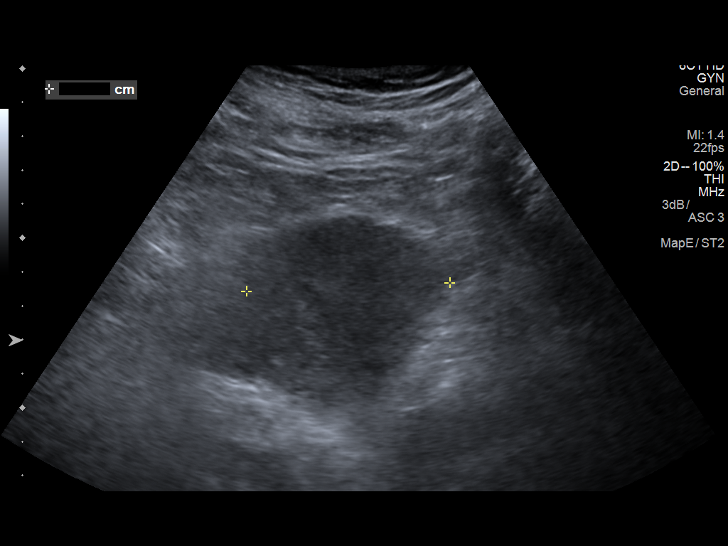
[im 11/61]
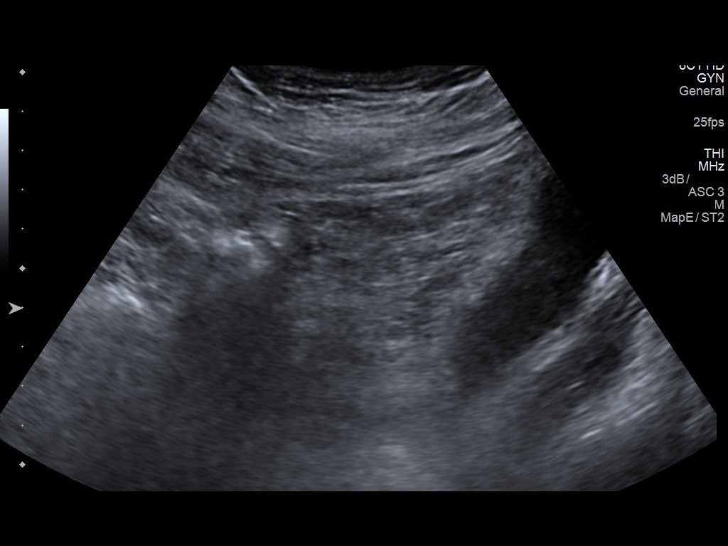
[im 16/61]
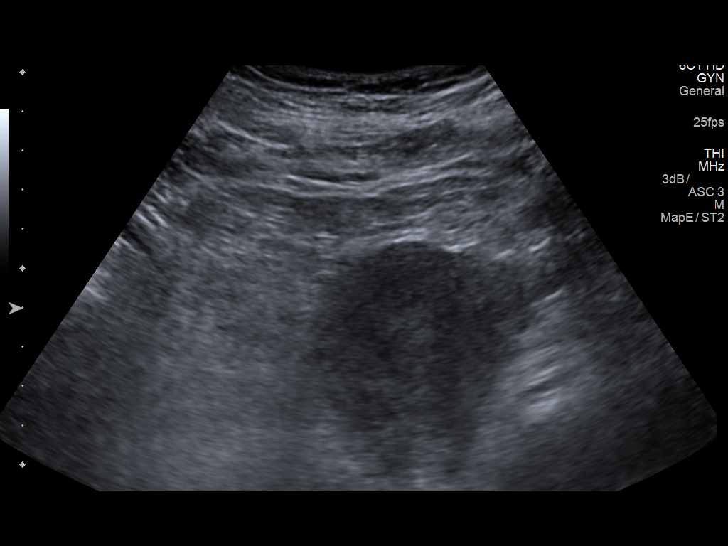
[im 21/61]
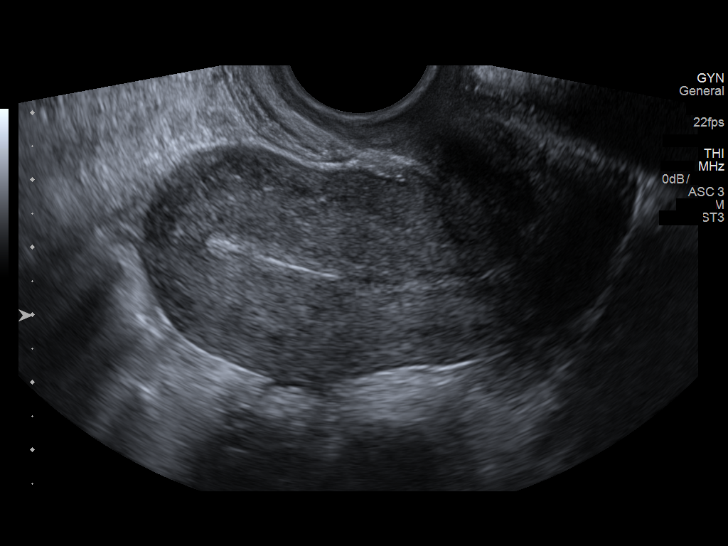
[im 26/61]
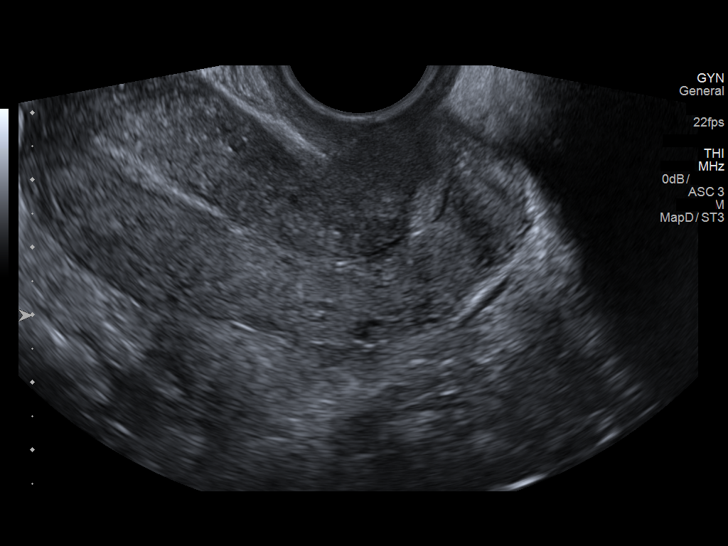
[im 31/61]
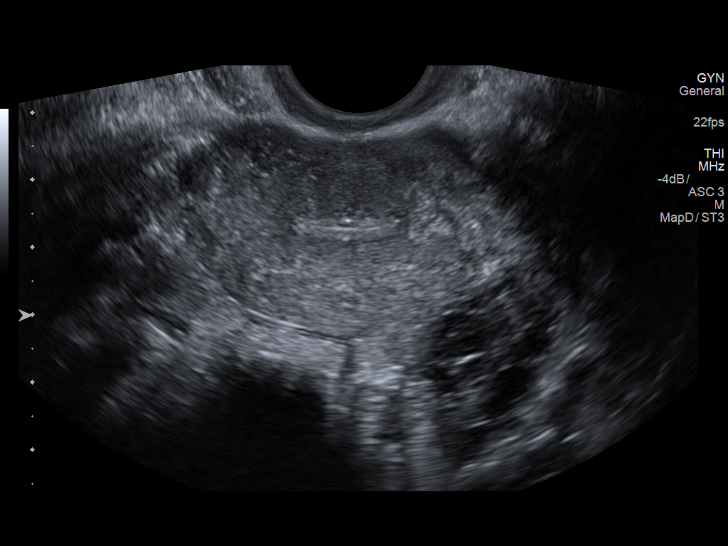
[im 36/61]
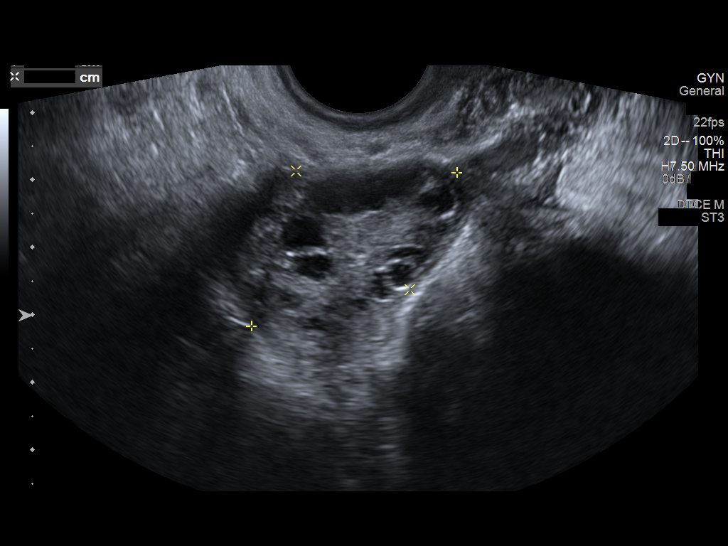
[im 41/61]
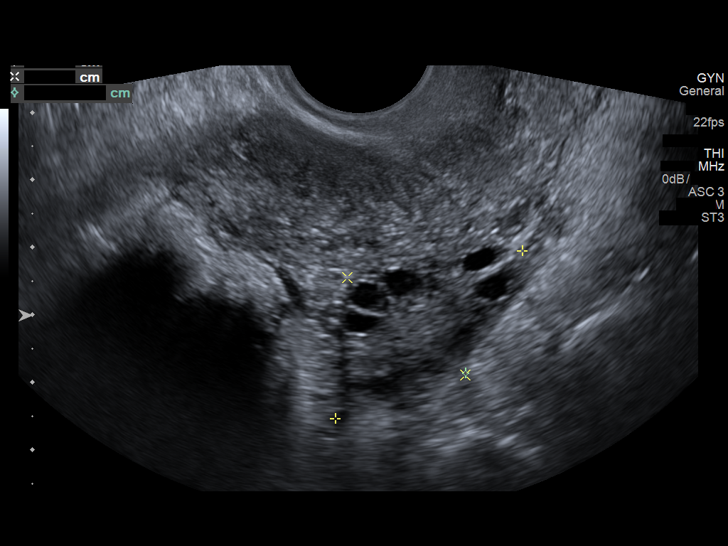
[im 46/61]
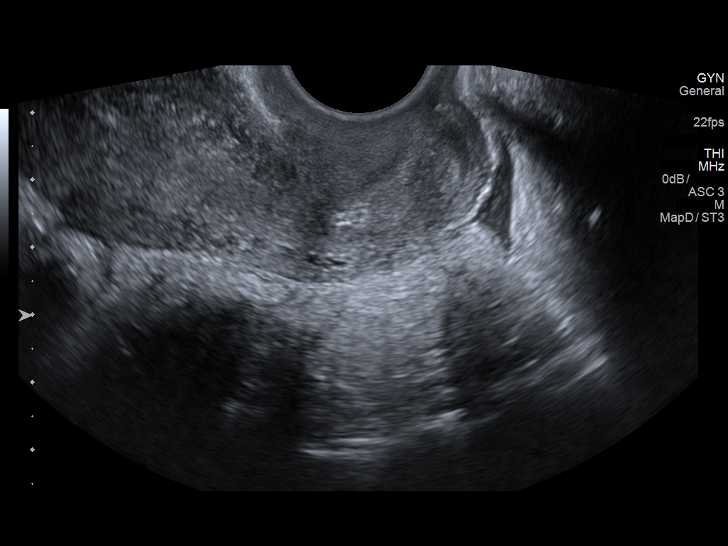
[im 51/61]
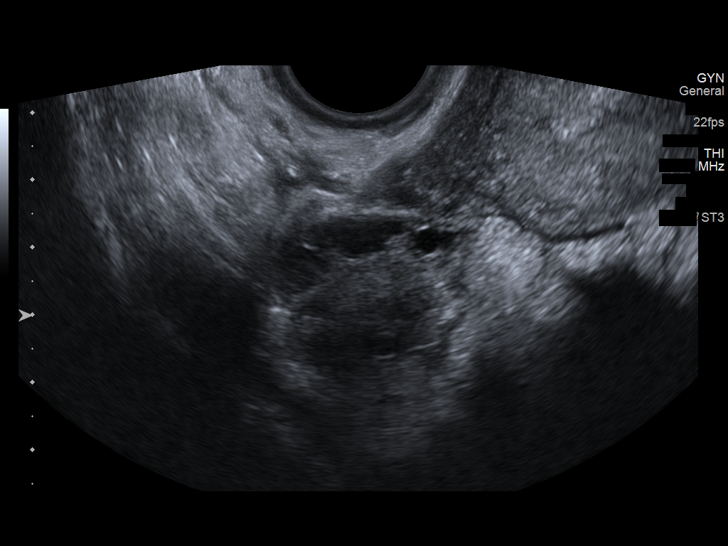
[im 56/61]
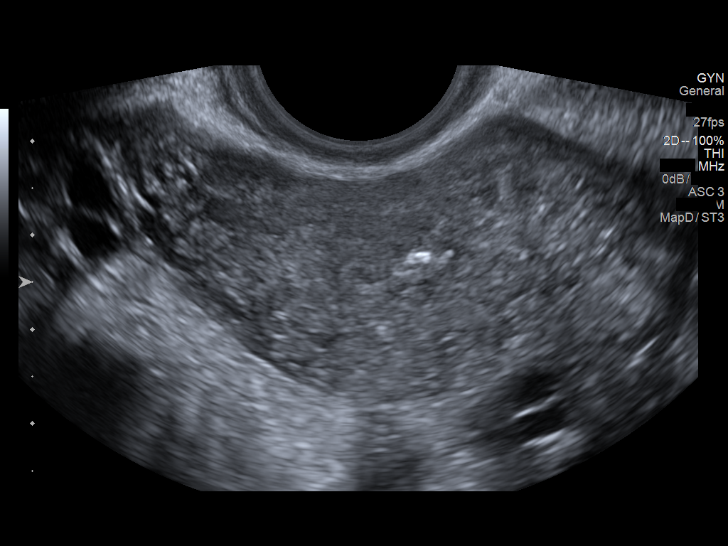
[im 61/61]
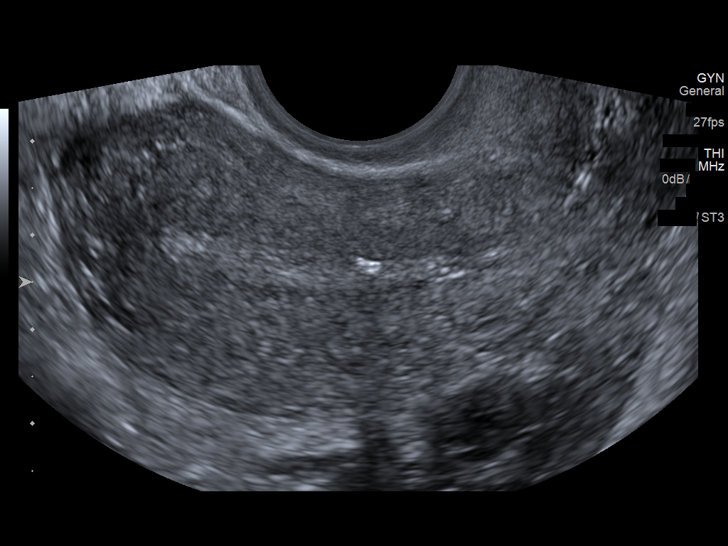

[13 of 25 positions shown; findings below may reference images not displayed]

FINDINGS: Uterus

Measurements: 7.6 x 3.0 x 6.0 cm. Anteflexed and normal in
appearance.

Endometrium

Thickness: 4.2 mm. Within the endometrium there is a 2 mm echogenic
focus possibly representing focal fibrosis or small calcification.

Right ovary

Measurements: 3.8 x 2.4 x 2.9 cm. Normal appearance/no adnexal mass.

Left ovary

Measurements: 3.2 x 2.3 x 1.9 cm. Normal appearance/no adnexal mass.

Other findings

Trace free pelvic fluid is likely physiologic.
IMPRESSION: 1.  No evidence for acute  abnormality.
2. Possible small area of fibrosis versus calcification in the
endometrium. Endometrium otherwise has a normal appearance.
3. No adnexal mass.

## 2017-08-22 ENCOUNTER — Inpatient Hospital Stay (HOSPITAL_COMMUNITY)
Admission: AD | Admit: 2017-08-22 | Discharge: 2017-08-22 | Disposition: A | Discharge status: Home / Self Care | Payer: Medicaid Other | Source: Ambulatory Visit | Attending: Family Medicine | Admitting: Family Medicine

## 2017-08-22 ENCOUNTER — Encounter (HOSPITAL_COMMUNITY): Payer: Self-pay | Admitting: *Deleted

## 2017-08-22 DIAGNOSIS — N938 Other specified abnormal uterine and vaginal bleeding: Secondary | ICD-10-CM | POA: Diagnosis not present

## 2017-08-22 DIAGNOSIS — Z87891 Personal history of nicotine dependence: Secondary | ICD-10-CM | POA: Insufficient documentation

## 2017-08-22 DIAGNOSIS — B9689 Other specified bacterial agents as the cause of diseases classified elsewhere: Secondary | ICD-10-CM | POA: Diagnosis not present

## 2017-08-22 DIAGNOSIS — R102 Pelvic and perineal pain: Secondary | ICD-10-CM | POA: Diagnosis not present

## 2017-08-22 DIAGNOSIS — N921 Excessive and frequent menstruation with irregular cycle: Secondary | ICD-10-CM

## 2017-08-22 DIAGNOSIS — N760 Acute vaginitis: Secondary | ICD-10-CM | POA: Insufficient documentation

## 2017-08-22 DIAGNOSIS — R109 Unspecified abdominal pain: Secondary | ICD-10-CM | POA: Diagnosis present

## 2017-08-22 DIAGNOSIS — Z975 Presence of (intrauterine) contraceptive device: Secondary | ICD-10-CM

## 2017-08-22 LAB — URINALYSIS, ROUTINE W REFLEX MICROSCOPIC
BILIRUBIN URINE: NEGATIVE
Glucose, UA: NEGATIVE mg/dL
KETONES UR: NEGATIVE mg/dL
Leukocytes, UA: NEGATIVE
NITRITE: NEGATIVE
PH: 5 (ref 5.0–8.0)
Protein, ur: NEGATIVE mg/dL
Specific Gravity, Urine: 1.02 (ref 1.005–1.030)

## 2017-08-22 LAB — CBC
HEMATOCRIT: 34.8 % — AB (ref 36.0–46.0)
HEMOGLOBIN: 11.8 g/dL — AB (ref 12.0–15.0)
MCH: 25.3 pg — ABNORMAL LOW (ref 26.0–34.0)
MCHC: 33.9 g/dL (ref 30.0–36.0)
MCV: 74.7 fL — ABNORMAL LOW (ref 78.0–100.0)
Platelets: 293 10*3/uL (ref 150–400)
RBC: 4.66 MIL/uL (ref 3.87–5.11)
RDW: 14 % (ref 11.5–15.5)
WBC: 5.3 10*3/uL (ref 4.0–10.5)

## 2017-08-22 LAB — WET PREP, GENITAL
SPERM: NONE SEEN
Trich, Wet Prep: NONE SEEN
YEAST WET PREP: NONE SEEN

## 2017-08-22 LAB — POCT PREGNANCY, URINE: PREG TEST UR: NEGATIVE

## 2017-08-22 LAB — HEPATITIS B SURFACE ANTIGEN: HEP B S AG: NEGATIVE

## 2017-08-22 MED ORDER — METRONIDAZOLE 500 MG PO TABS: 500.0000 mg | ORAL_TABLET | Freq: Two times a day (BID) | ORAL | 0 refills | Status: AC

## 2017-08-22 NOTE — MAU Note (Signed)
Pt reports mid/lower abd pain for 2 months, pain comes/goes. Reports vaginal discharge with an odor and spotting off and on. Reports nausea off/on. Denies vomiting, diarrhea or fever

## 2017-08-22 NOTE — MAU Provider Note (Signed)
Chief Complaint:  Abdominal Pain; Vaginal Discharge; and Vaginal Bleeding   First Provider Initiated Contact with Patient 08/22/17 1012       HPI: Kelly Hensley is a 21 y.o. [id number] who presents to maternity admissions reporting pelvic pain for 2 months, coming and going.  States it is mild crampy in nature.   Is mostly concerned for STD due to vaginal odor and spotting.  Has had multiple partners since delivery in.July.  Has a Nexplanon in place.   She reports mild vaginal bleeding, vaginal itching/burning, but no urinary symptoms, h/a, dizziness, n/v, or fever/chills.    Abdominal Pain  This is a new problem. The current episode started more than 1 month ago. The onset quality is gradual. The problem occurs intermittently. The problem has been waxing and waning. The pain is located in the generalized abdominal region and suprapubic region. The pain is mild. The quality of the pain is cramping. The abdominal pain does not radiate. Pertinent negatives include no anorexia, constipation, diarrhea, dysuria, fever, myalgias, nausea or vomiting. Nothing aggravates the pain. The pain is relieved by nothing. She has tried nothing for the symptoms.  Vaginal Discharge  The patient's primary symptoms include a genital odor, pelvic pain and vaginal discharge. The patient's pertinent negatives include no genital itching or genital lesions. The current episode started more than 1 month ago. The problem occurs constantly. The problem has been unchanged. Associated symptoms include abdominal pain. Pertinent negatives include no anorexia, constipation, diarrhea, dysuria, fever, nausea or vomiting. The vaginal discharge was milky. The vaginal bleeding is spotting. She has not been passing clots. She has not been passing tissue.  Vaginal Bleeding  The patient's primary symptoms include a genital odor, pelvic pain and vaginal discharge. The patient's pertinent negatives include no genital itching or genital lesions. The  current episode started more than 1 month ago. The problem occurs intermittently. The pain is mild. Associated symptoms include abdominal pain. Pertinent negatives include no anorexia, constipation, diarrhea, dysuria, fever, nausea or vomiting. The vaginal bleeding is spotting. Contraceptive use: Nexplanon.    RN Note: Pt reports mid/lower abd pain for 2 months, pain comes/goes. Reports vaginal discharge with an odor and spotting off and on. Reports nausea off/on. Denies vomiting, diarrhea or fever    Past Medical History: Past Medical History:  Diagnosis Date  . Anemia   . Cellulitis and abscess of trunk   . HPV (human papilloma virus) infection   . Migraines   . Morbid obesity (HCC)   . Sickle cell trait (HCC)     Past obstetric history: OB History  Gravida Para Term Preterm AB Living  2 2 2  0 0 2  SAB TAB Ectopic Multiple Live Births  0 0 0 0 2    # Outcome Date GA Lbr Len/2nd Weight Sex Delivery Anes PTL Lv  2 Term 04/12/17 [redacted]w[redacted]d 02:33 / 00:09 7 lb 6.9 oz (3.371 kg) M Vag-Spont EPI  LIV  1 Term 03/25/16 [redacted]w[redacted]d 37:30 / 03:17 7 lb 4.2 oz (3.295 kg) F Vag-Spont EPI  LIV      Past Surgical History: Past Surgical History:  Procedure Laterality Date  . ABCESS DRAINAGE Left 2014    Family History: Family History  Problem Relation Age of Onset  . Diabetes Mother   . Sickle cell anemia Father     Social History: Social History   Tobacco Use  . Smoking status: Former Smoker    Packs/day: 0.25    Types: Cigarettes  Last attempt to quit: 02/17/2015    Years since quitting: 2.5  . Smokeless tobacco: Never Used  Substance Use Topics  . Alcohol use: No  . Drug use: No    Comment: denies but smell marijuana    Allergies: No Known Allergies  Meds:  No medications prior to admission.    I have reviewed patient's Past Medical Hx, Surgical Hx, Family Hx, Social Hx, medications and allergies.  ROS:  Review of Systems  Constitutional: Negative for fever.   Gastrointestinal: Positive for abdominal pain. Negative for anorexia, constipation, diarrhea, nausea and vomiting.  Genitourinary: Positive for pelvic pain, vaginal bleeding and vaginal discharge. Negative for dysuria.  Musculoskeletal: Negative for myalgias.   Other systems negative     Physical Exam   Patient Vitals for the past 24 hrs:  BP Temp Temp src Pulse Resp SpO2 Height Weight  08/22/17 1146 (!) 141/83 - - 79 17 - - -  08/22/17 0933 127/70 98.5 F (36.9 C) Oral 79 17 100 % 5\' 7"  (1.702 m) 266 lb (120.7 kg)   Constitutional: Well-developed, well-nourished female in no acute distress.  Cardiovascular: normal rate and rhythm, no ectopy audible, S1 & S2 heard, no murmur Respiratory: normal effort, no distress. Lungs CTAB with no wheezes or crackles GI: Abd soft, non-tender.  Nondistended.  No rebound, No guarding.  Bowel Sounds audible  MS: Extremities nontender, no edema, normal ROM Neurologic: Alert and oriented x 4.   Grossly nonfocal. GU: Neg CVAT. Skin:  Warm and Dry Psych:  Affect appropriate.  PELVIC EXAM: Cervix pink, visually closed, without lesion, scant white creamy discharge, vaginal walls and external genitalia normal Bimanual exam: Cervix firm, anterior, neg CMT, uterus nontender, nonenlarged, adnexa with mild tenderness, but no enlargement, or mass    Labs: Results for orders placed or performed during the hospital encounter of 08/22/17 (from the past 24 hour(s))  Urinalysis, Routine w reflex microscopic     Status: Abnormal   Collection Time: 08/22/17  9:28 AM  Result Value Ref Range   Color, Urine YELLOW YELLOW   APPearance HAZY (A) CLEAR   Specific Gravity, Urine 1.020 1.005 - 1.030   pH 5.0 5.0 - 8.0   Glucose, UA NEGATIVE NEGATIVE mg/dL   Hgb urine dipstick MODERATE (A) NEGATIVE   Bilirubin Urine NEGATIVE NEGATIVE   Ketones, ur NEGATIVE NEGATIVE mg/dL   Protein, ur NEGATIVE NEGATIVE mg/dL   Nitrite NEGATIVE NEGATIVE   Leukocytes, UA NEGATIVE  NEGATIVE   RBC / HPF 0-5 0 - 5 RBC/hpf   WBC, UA 0-5 0 - 5 WBC/hpf   Bacteria, UA RARE (A) NONE SEEN   Squamous Epithelial / LPF 6-30 (A) NONE SEEN   Mucus PRESENT   Pregnancy, urine POC     Status: None   Collection Time: 08/22/17  9:54 AM  Result Value Ref Range   Preg Test, Ur NEGATIVE NEGATIVE  Wet prep, genital     Status: Abnormal   Collection Time: 08/22/17 10:54 AM  Result Value Ref Range   Yeast Wet Prep HPF POC NONE SEEN NONE SEEN   Trich, Wet Prep NONE SEEN NONE SEEN   Clue Cells Wet Prep HPF POC PRESENT (A) NONE SEEN   WBC, Wet Prep HPF POC FEW (A) NONE SEEN   Sperm NONE SEEN   CBC     Status: Abnormal   Collection Time: 08/22/17 11:02 AM  Result Value Ref Range   WBC 5.3 4.0 - 10.5 K/uL   RBC 4.66 3.87 -  5.11 MIL/uL   Hemoglobin 11.8 (L) 12.0 - 15.0 g/dL   HCT 09.834.8 (L) 11.936.0 - 14.746.0 %   MCV 74.7 (L) 78.0 - 100.0 fL   MCH 25.3 (L) 26.0 - 34.0 pg   MCHC 33.9 30.0 - 36.0 g/dL   RDW 82.914.0 56.211.5 - 13.015.5 %   Platelets 293 150 - 400 K/uL   --/--/B POS (07/25 1829)  Imaging:  No results found.  MAU Course/MDM: I have ordered labs as follows:  CBC to rule out systemic infection, Wet prep, STD testing Imaging ordered: none Results reviewed.   Treatments in MAU included none.   Pt stable at time of discharge.  Assessment: 1. Pelvic pain   2. BV (bacterial vaginosis)   3. Breakthrough bleeding on Nexplanon     Plan: Discharge home Reviewed intermittent bleeding pattern is normal with Nexplanon Recommend Safe sex practices Rx sent for Flagyl  for BV Will treat other conditions as warranted by results Encouraged to return here or to other Urgent Care/ED if she develops worsening of symptoms, increase in pain, fever, or other concerning symptoms.     Follow-up Information    [facility]   Specialty:  Obstetrics and Gynecology Contact information: [address] [place] [id number] [phone]           Encouraged to return here or to other Urgent Care/ED if she develops worsening of symptoms, increase in pain, fever, or other concerning symptoms.   Wynelle BourgeoisMarie Tinaya Ceballos CNM, MSN Certified Nurse-Midwife 08/22/2017 12:25 PM

## 2017-08-22 NOTE — Discharge Instructions (Signed)
Safe Sex Practicing safe sex means taking steps before and during sex to reduce your risk of:  Getting an STD (sexually transmitted disease).  Giving your partner an STD.  Unwanted pregnancy.  How can I practice safe sex?  To practice safe sex:  Limit your sexual partners to only one partner who is having sex with only you.  Avoid using alcohol and recreational drugs before having sex. These substances can affect your judgment.  Before having sex with a new partner: ? Talk to your partner about past partners, past STDs, and drug use. ? You and your partner should be screened for STDs and discuss the results with each other.  Check your body regularly for sores, blisters, rashes, or unusual discharge. If you notice any of these problems, visit your health care provider.  If you have symptoms of an infection or you are being treated for an STD, avoid sexual contact.  While having sex, use a condom. Make sure to: ? Use a condom every time you have vaginal, oral, or anal sex. Both females and males should wear condoms during oral sex. ? Keep condoms in place from the beginning to the end of sexual activity. ? Use a latex condom, if possible. Latex condoms offer the best protection. ? Use only water-based lubricants or oils to lubricate a condom. Using petroleum-based lubricants or oils will weaken the condom and increase the chance that it will break.  See your health care provider for regular screenings, exams, and tests for STDs.  Talk with your health care provider about the form of birth control (contraception) that is best for you.  Get vaccinated against hepatitis B and human papillomavirus (HPV).  If you are at risk of being infected with HIV (human immunodeficiency virus), talk with your health care provider about taking a prescription medicine to prevent HIV infection. You are considered at risk for HIV if: ? You are a man who has sex with other men. ? You are a  heterosexual man or woman who is sexually active with more than one partner. ? You take drugs by injection. ? You are sexually active with a partner who has HIV.  This information is not intended to replace advice given to you by your health care provider. Make sure you discuss any questions you have with your health care provider. Document Released: 10/12/2004 Document Revised: 01/19/2016 Document Reviewed: 07/25/2015 Elsevier Interactive Patient Education  2018 Elsevier Inc. Bacterial Vaginosis Bacterial vaginosis is an infection of the vagina. It happens when too many germs (bacteria) grow in the vagina. This infection puts you at risk for infections from sex (STIs). Treating this infection can lower your risk for some STIs. You should also treat this if you are pregnant. It can cause your baby to be born early. Follow these instructions at home: Medicines  Take over-the-counter and prescription medicines only as told by your doctor.  Take or use your antibiotic medicine as told by your doctor. Do not stop taking or using it even if you start to feel better. General instructions  If you your sexual partner is a woman, tell her that you have this infection. She needs to get treatment if she has symptoms. If you have a female partner, he does not need to be treated.  During treatment: ? Avoid sex. ? Do not douche. ? Avoid alcohol as told. ? Avoid breastfeeding as told.  Drink enough fluid to keep your pee (urine) clear or pale yellow.  Keep your vagina  and butt (rectum) clean. ? Wash the area with warm water every day. ? Wipe from front to back after you use the toilet.  Keep all follow-up visits as told by your doctor. This is important. Preventing this condition  Do not douche.  Use only warm water to wash around your vagina.  Use protection when you have sex. This includes: ? Latex condoms. ? Dental dams.  Limit how many people you have sex with. It is best to only have  sex with the same person (be monogamous).  Get tested for STIs. Have your partner get tested.  Wear underwear that is cotton or lined with cotton.  Avoid tight pants and pantyhose. This is most important in summer.  Do not use any products that have nicotine or tobacco in them. These include cigarettes and e-cigarettes. If you need help quitting, ask your doctor.  Do not use illegal drugs.  Limit how much alcohol you drink. Contact a doctor if:  Your symptoms do not get better, even after you are treated.  You have more discharge or pain when you pee (urinate).  You have a fever.  You have pain in your belly (abdomen).  You have pain with sex.  Your bleed from your vagina between periods. Summary  This infection happens when too many germs (bacteria) grow in the vagina.  Treating this condition can lower your risk for some infections from sex (STIs).  You should also treat this if you are pregnant. It can cause early (premature) birth.  Do not stop taking or using your antibiotic medicine even if you start to feel better. This information is not intended to replace advice given to you by your health care provider. Make sure you discuss any questions you have with your health care provider. Document Released: 06/13/2008 Document Revised: 05/20/2016 Document Reviewed: 05/20/2016 Elsevier Interactive Patient Education  2017 [employer]

## 2017-08-23 LAB — RPR: RPR Ser Ql: NONREACTIVE

## 2017-08-23 LAB — GC/CHLAMYDIA PROBE AMP (~~LOC~~) NOT AT ARMC
Chlamydia: NEGATIVE
Neisseria Gonorrhea: NEGATIVE

## 2017-08-23 LAB — HIV ANTIBODY (ROUTINE TESTING W REFLEX): HIV SCREEN 4TH GENERATION: NONREACTIVE

## 2017-09-12 ENCOUNTER — Encounter (HOSPITAL_COMMUNITY): Payer: Self-pay | Admitting: *Deleted

## 2017-09-12 ENCOUNTER — Inpatient Hospital Stay (HOSPITAL_COMMUNITY)
Admission: AD | Admit: 2017-09-12 | Discharge: 2017-09-12 | Disposition: A | Discharge status: Home / Self Care | Payer: Medicaid Other | Source: Ambulatory Visit | Attending: Family Medicine | Admitting: Family Medicine

## 2017-09-12 DIAGNOSIS — R109 Unspecified abdominal pain: Secondary | ICD-10-CM | POA: Insufficient documentation

## 2017-09-12 DIAGNOSIS — F410 Panic disorder [episodic paroxysmal anxiety] without agoraphobia: Secondary | ICD-10-CM | POA: Insufficient documentation

## 2017-09-12 DIAGNOSIS — K5900 Constipation, unspecified: Secondary | ICD-10-CM | POA: Diagnosis not present

## 2017-09-12 DIAGNOSIS — B373 Candidiasis of vulva and vagina: Secondary | ICD-10-CM

## 2017-09-12 DIAGNOSIS — Z87891 Personal history of nicotine dependence: Secondary | ICD-10-CM | POA: Diagnosis not present

## 2017-09-12 DIAGNOSIS — D573 Sickle-cell trait: Secondary | ICD-10-CM | POA: Diagnosis not present

## 2017-09-12 DIAGNOSIS — B3731 Acute candidiasis of vulva and vagina: Secondary | ICD-10-CM

## 2017-09-12 DIAGNOSIS — N898 Other specified noninflammatory disorders of vagina: Secondary | ICD-10-CM | POA: Diagnosis present

## 2017-09-12 DIAGNOSIS — F419 Anxiety disorder, unspecified: Secondary | ICD-10-CM | POA: Diagnosis not present

## 2017-09-12 HISTORY — DX: Papillomavirus as the cause of diseases classified elsewhere: B97.7

## 2017-09-12 LAB — WET PREP, GENITAL
SPERM: NONE SEEN
TRICH WET PREP: NONE SEEN

## 2017-09-12 LAB — COMPREHENSIVE METABOLIC PANEL
ALK PHOS: 93 U/L (ref 38–126)
ALT: 14 U/L (ref 14–54)
ANION GAP: 9 (ref 5–15)
AST: 10 U/L — ABNORMAL LOW (ref 15–41)
Albumin: 4 g/dL (ref 3.5–5.0)
BILIRUBIN TOTAL: 0.6 mg/dL (ref 0.3–1.2)
BUN: 14 mg/dL (ref 6–20)
CALCIUM: 9.2 mg/dL (ref 8.9–10.3)
CO2: 22 mmol/L (ref 22–32)
CREATININE: 0.83 mg/dL (ref 0.44–1.00)
Chloride: 106 mmol/L (ref 101–111)
Glucose, Bld: 87 mg/dL (ref 65–99)
Potassium: 4.3 mmol/L (ref 3.5–5.1)
Sodium: 137 mmol/L (ref 135–145)
TOTAL PROTEIN: 7.5 g/dL (ref 6.5–8.1)

## 2017-09-12 LAB — URINALYSIS, ROUTINE W REFLEX MICROSCOPIC
Bilirubin Urine: NEGATIVE
GLUCOSE, UA: NEGATIVE mg/dL
Hgb urine dipstick: NEGATIVE
Ketones, ur: 5 mg/dL — AB
NITRITE: NEGATIVE
PH: 5 (ref 5.0–8.0)
PROTEIN: NEGATIVE mg/dL
Specific Gravity, Urine: 1.026 (ref 1.005–1.030)

## 2017-09-12 LAB — CBC
HCT: 36.8 % (ref 36.0–46.0)
HEMOGLOBIN: 13 g/dL (ref 12.0–15.0)
MCH: 25.5 pg — AB (ref 26.0–34.0)
MCHC: 35.3 g/dL (ref 30.0–36.0)
MCV: 72.3 fL — AB (ref 78.0–100.0)
Platelets: 293 10*3/uL (ref 150–400)
RBC: 5.09 MIL/uL (ref 3.87–5.11)
RDW: 13.9 % (ref 11.5–15.5)
WBC: 6.3 10*3/uL (ref 4.0–10.5)

## 2017-09-12 LAB — POCT PREGNANCY, URINE: Preg Test, Ur: NEGATIVE

## 2017-09-12 LAB — LIPASE, BLOOD: LIPASE: 22 U/L (ref 11–51)

## 2017-09-12 MED ORDER — FLUCONAZOLE 150 MG PO TABS
ORAL_TABLET | ORAL | 0 refills | Status: DC
Start: 2017-09-12 — End: 2019-12-01

## 2017-09-12 MED ORDER — FLUCONAZOLE 150 MG PO TABS
150.0000 mg | ORAL_TABLET | Freq: Once | ORAL | Status: AC
  Administered 2017-09-12: 150 mg via ORAL
  Filled 2017-09-12: qty 1

## 2017-09-12 NOTE — MAU Note (Signed)
Pt states she still has pain in her lower abdomen - started 4 months ago.  Also think she has a yeast infection - has itching, burning & discharge.

## 2017-09-12 NOTE — MAU Provider Note (Signed)
Chief Complaint: Abdominal Pain and Vaginal Discharge   First Provider Initiated Contact with Patient 09/12/17 1137      SUBJECTIVE HPI: Kelly Hensley is a 21 y.o. [id number] who presents to maternity admissions reporting vaginal discharge and itching x 3 days c/w previous yeast infection symptoms. She also reports 4 months of intermittent sharp abdominal pains at her umbilicus, and in her left and right flank areas that sometimes radiate down to her low abdomen/pelvis. She denies constipation but reports it is normal for her to go 2-3 days without a bowel movement.  She was seen in MAU on 12/5 and had STD testing which was all normal. She has a primary care doctor but has been busy and unable to make an appointment.  She reports her abdominal pain has her worried. She has panic attacks and is having more of them, as many as 4-5 weekly lately and her pain is making her worry more and add to her anxiety.  She is not taking anything for anxiety but would like to try something.  She is currently breastfeeding her 505 month old so wants something that would be safe.  She has hx of HPV and abnormal pap with follow up cervical procedure before she had her 2 children so is worried that something is very wrong like cancer causing her abdominal pain.  She does not want blood labwork today since she had this done earlier this month. She denies vaginal bleeding, urinary symptoms, h/a, dizziness, n/v, or fever/chills.     HPI  Past Medical History:  Diagnosis Date  . Anemia   . Cellulitis and abscess of trunk   . 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 Migraines   . Morbid obesity (HCC)   . Sickle cell trait [facility](HCC)    Past Surgical History:  Procedure Laterality Date  . ABCESS DRAINAGE Left 2014   Social History   Socioeconomic History  . Marital status: Single    Spouse name: Not on file  . Number of children: Not on file  . Years of education: Not on file  . Highest education  level: Not on file  Social Needs  . Financial resource strain: Not on file  . Food insecurity - worry: Not on file  . Food insecurity - inability: Not on file  . Transportation needs - medical: Not on file  . Transportation needs - non-medical: Not on file  Occupational History  . Not on file  Tobacco Use  . Smoking status: Former Smoker    Packs/day: 0.25    Types: Cigarettes    Last attempt to quit: 02/17/2015    Years since quitting: 2.5  . Smokeless tobacco: Never Used  Substance and Sexual Activity  . Alcohol use: No  . Drug use: No    Comment: denies but smell marijuana  . Sexual activity: Yes    Birth control/protection: Implant  Other Topics Concern  . Not on file  Social History Narrative  . Not on file   No current facility-administered medications on file prior to encounter.    Current Outpatient Medications on File Prior to Encounter  Medication Sig Dispense Refill  . Prenatal Multivit-Min-Fe-FA (PRENATAL VITAMINS) 0.8 MG tablet Take 1 tablet by mouth daily. 30 tablet 12  . Prenatal MV & Min w/FA-DHA (PRENATAL ADULT GUMMY/DHA/FA) 0.4-25 MG CHEW Chew 1 tablet by mouth daily. (Patient not taking: Reported on 08/22/2017) 30 tablet 11   No Known Allergies  ROS:  Review of Systems  Constitutional: Negative for chills, fatigue and fever.  Respiratory: Negative for shortness of breath.   Cardiovascular: Negative for chest pain.  Gastrointestinal: Negative for nausea and vomiting.  Genitourinary: Positive for pelvic pain and vaginal discharge. Negative for difficulty urinating, dysuria, flank pain, vaginal bleeding and vaginal pain.  Neurological: Negative for dizziness and headaches.  Psychiatric/Behavioral: Negative.      I have reviewed patient's Past Medical Hx, Surgical Hx, Family Hx, Social Hx, medications and allergies.   Physical Exam   Patient Vitals for the past 24 hrs:  BP Temp Temp src Pulse Resp Height Weight  09/12/17 1253 124/76 - - 92 - - -   09/12/17 1252 - - - - 16 - -  09/12/17 1039 131/74 98.2 F (36.8 C) Oral 89 18 5\' 7"  (1.702 m) 259 lb (117.5 kg)   Constitutional: Well-developed, well-nourished female in no acute distress.  Cardiovascular: normal rate Respiratory: normal effort GI: Abd soft, non-tender. Pos BS x 4 MS: Extremities nontender, no edema, normal ROM Neurologic: Alert and oriented x 4.  GU: Neg CVAT.  PELVIC EXAM: Cervix pink, visually closed, without lesion, moderate amount thick white discharge, vaginal walls and external genitalia normal Bimanual exam: Cervix 0/long/high, firm, anterior, neg CMT, uterus nontender, nonenlarged, adnexa without tenderness, enlargement, or mass   LAB RESULTS Results for orders placed or performed during the hospital encounter of 09/12/17 (from the past 24 hour(s))  Urinalysis, Routine w reflex microscopic     Status: Abnormal   Collection Time: 09/12/17 10:43 AM  Result Value Ref Range   Color, Urine YELLOW YELLOW   APPearance HAZY (A) CLEAR   Specific Gravity, Urine 1.026 1.005 - 1.030   pH 5.0 5.0 - 8.0   Glucose, UA NEGATIVE NEGATIVE mg/dL   Hgb urine dipstick NEGATIVE NEGATIVE   Bilirubin Urine NEGATIVE NEGATIVE   Ketones, ur 5 (A) NEGATIVE mg/dL   Protein, ur NEGATIVE NEGATIVE mg/dL   Nitrite NEGATIVE NEGATIVE   Leukocytes, UA LARGE (A) NEGATIVE   RBC / HPF 0-5 0 - 5 RBC/hpf   WBC, UA 6-30 0 - 5 WBC/hpf   Bacteria, UA RARE (A) NONE SEEN   Squamous Epithelial / LPF 0-5 (A) NONE SEEN   Mucus PRESENT   Pregnancy, urine POC     Status: None   Collection Time: 09/12/17 11:18 AM  Result Value Ref Range   Preg Test, Ur NEGATIVE NEGATIVE  CBC     Status: Abnormal   Collection Time: 09/12/17 12:00 PM  Result Value Ref Range   WBC 6.3 4.0 - 10.5 K/uL   RBC 5.09 3.87 - 5.11 MIL/uL   Hemoglobin 13.0 12.0 - 15.0 g/dL   HCT 81.1 91.4 - 78.2 %   MCV 72.3 (L) 78.0 - 100.0 fL   MCH 25.5 (L) 26.0 - 34.0 pg   MCHC 35.3 30.0 - 36.0 g/dL   RDW 95.6 21.3 - 08.6 %    Platelets 293 150 - 400 K/uL  Comprehensive metabolic panel     Status: Abnormal   Collection Time: 09/12/17 12:00 PM  Result Value Ref Range   Sodium 137 135 - 145 mmol/L   Potassium 4.3 3.5 - 5.1 mmol/L   Chloride 106 101 - 111 mmol/L   CO2 22 22 - 32 mmol/L   Glucose, Bld 87 65 - 99 mg/dL   BUN 14 6 - 20 mg/dL   Creatinine, Ser 5.78 0.44 - 1.00 mg/dL   Calcium 9.2 8.9 - 10.3  mg/dL   Total Protein 7.5 6.5 - 8.1 g/dL   Albumin 4.0 3.5 - 5.0 g/dL   AST 10 (L) 15 - 41 U/L   ALT 14 14 - 54 U/L   Alkaline Phosphatase 93 38 - 126 U/L   Total Bilirubin 0.6 0.3 - 1.2 mg/dL   GFR calc non Af Amer >60 >60 mL/min   GFR calc Af Amer >60 >60 mL/min   Anion gap 9 5 - 15  Lipase, blood     Status: None   Collection Time: 09/12/17 12:00 PM  Result Value Ref Range   Lipase 22 11 - 51 U/L  Wet prep, genital     Status: Abnormal   Collection Time: 09/12/17 12:00 PM  Result Value Ref Range   Yeast Wet Prep HPF POC PRESENT (A) NONE SEEN   Trich, Wet Prep NONE SEEN NONE SEEN   Clue Cells Wet Prep HPF POC PRESENT (A) NONE SEEN   WBC, Wet Prep HPF POC MANY (A) NONE SEEN   Sperm NONE SEEN     --/--/B POS (07/25 1829)  IMAGING No results found.  MAU Management/MDM: Ordered labs and reviewed results.  Yeast vaginitis noted on labwork and clinical exam. Will treat with Diflucan, first dose given in MAU per pt request, second dose ordered for 48-72 hours from now as Rx sent to pharmacy.  Clue cells positive pt pt denies symptoms of BV so will not treat without any other Ams[facility] criteria.  Reassurance provided to pt that her pain is not likely anything serious, and may be digestive from mild constipation.  Encouraged pt to eat high fiber diet and drink plenty of water.  Recommend pt see behaviorial health for worsening anxiety and given contact information for [facility].  Pt also plans to see her primary care doctor as soon as possible.   Regarding pt report  of positive HPV and cervical procedure, discussed current recommendations to wait for Pap until age 20 and no HPV testing unless negative Pap result until age 20, when HPV is tested routinely.  Reassurance provided to pt that HPV is likely cleared by the young patient and that is why recommendations are not to test so young. She should begin Pap at age 21.  She may have STD testing PRN until then but risks of cervical and other female cancers is too low at age 21 to do Pap or other routine testing.  Pt discharged with strict precautions/reasons to return to MAU.  ASSESSMENT 1. Yeast vaginitis   2. Constipation, unspecified constipation type   3. Abdominal pain in female patient     PLAN Discharge home Allergies as of 09/12/2017   No Known Allergies     Medication List    STOP taking these medications   ferrous sulfate 325 (65 FE) MG tablet Commonly known as:  FERROUSUL     TAKE these medications   fluconazole 150 MG tablet Commonly known as:  DIFLUCAN Take 1 tablet on Saturday, 09/15/17, 3 days from your dose given in MAU.   Prenatal Vitamins 0.8 MG tablet Take 1 tablet by mouth daily.     ASK your doctor about these medications   Prenatal Adult Gummy/DHA/FA 0.4-25 MG Chew Chew 1 tablet by mouth daily.      Follow-up Information    [facility]   Specialty:  Obstetrics and Gynecology Why:  As needed for routine Gyn care/testing.  Return to MAU as needed  for gyn emergencies.  Contact information: [address] [place] [address] [phone]       Department, [facility] Follow up.   Why:  By appointment or walk in for STD testing Contact information: [address] [place] [address] [phone]        [facility] Follow up.   Contact information: [address] [place] [address] [phone]        [facility] Follow up.   Specialty:  Doctor, hospital information: [address] [place] [address] [phone]        Rometta Emery, MD Follow up.   Specialty:  Internal Medicine Contact information: [address] [place] [address] [phone]           Sharen Counter Certified Nurse-Midwife 09/12/2017  9:08 PM

## 2017-09-12 NOTE — Discharge Instructions (Signed)
In late 2019, the [facility] will be moving to the [place] campus. At that time, the MAU (Maternity Admissions Unit), where you are being seen today, will no longer take care of non-pregnant patients. We strongly encourage you to find a doctor's office before that time, so that you can be seen with any GYN concerns, like vaginal discharge, urinary tract infection, etc.. in a timely manner. ° °In order to make an office visit more convenient, the [facility] will be offering evening hours with same-day appointments, walk-in appointments and scheduled appointments available during this time. ° °[facility] Hours: °Monday - 8am - 7:30 pm with walk-in between 4pm- 7:30 pm °Tuesday - 8 am - 5 pm (starting 12/18/17 we will be open late and accepting walk-ins from 4pm - 7:30pm) °Wednesday - 8 am - 5 pm (starting 03/20/18 we will be open late and accepting walk-ins from 4pm - 7:30pm) °Thursday 8 am - 5 pm (starting 06/20/18 we will be open late and accepting walk-ins from 4pm - 7:30pm) °Friday 8 am - 5 pm ° °For an appointment please call the [facility] at [phone] ° °For urgent needs,  Urgent Care is also available for management of urgent GYN complaints such as vaginal discharge or urinary tract infections. ° ° ° ° ° °

## 2017-09-13 LAB — GC/CHLAMYDIA PROBE AMP (~~LOC~~) NOT AT ARMC
Chlamydia: NEGATIVE
Neisseria Gonorrhea: NEGATIVE

## 2018-03-11 IMAGING — US US MFM OB DETAIL+14 WK
1 series · 13 of 28 positions shown · non-contrast
Comparison: none

[Series 1: us mfm ob detail+14 wk · 72 acquisitions, 13 frames shown]
[im 3/72]
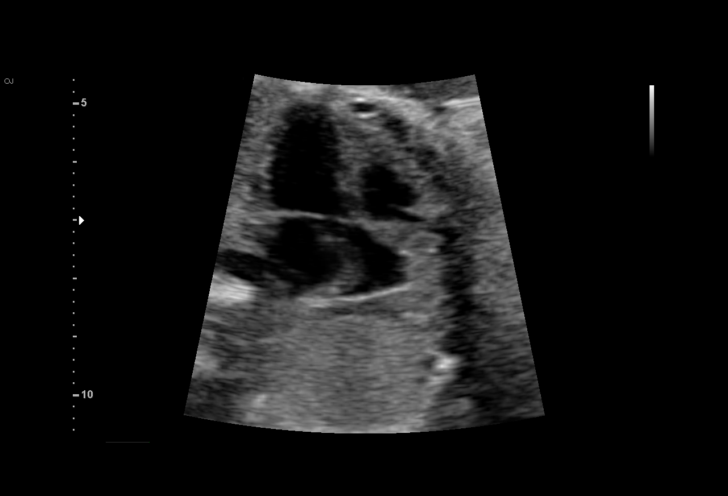
[im 8/72]
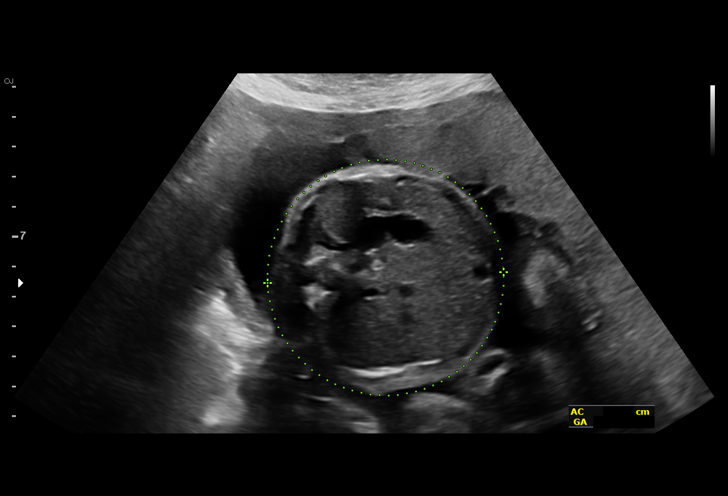
[im 14/72]
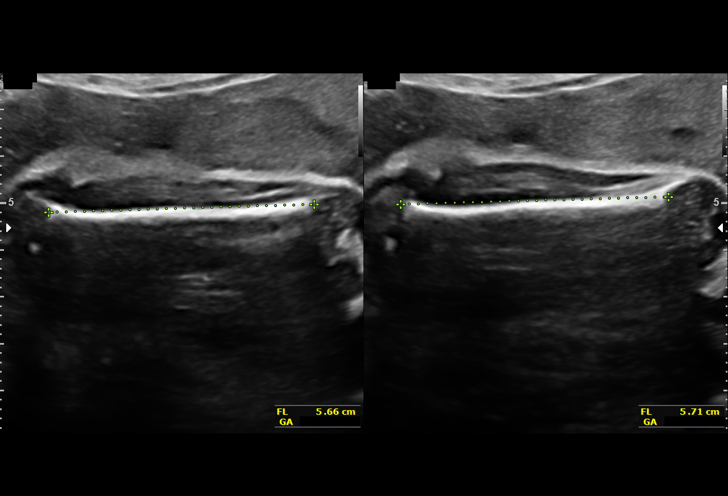
[im 19/72]
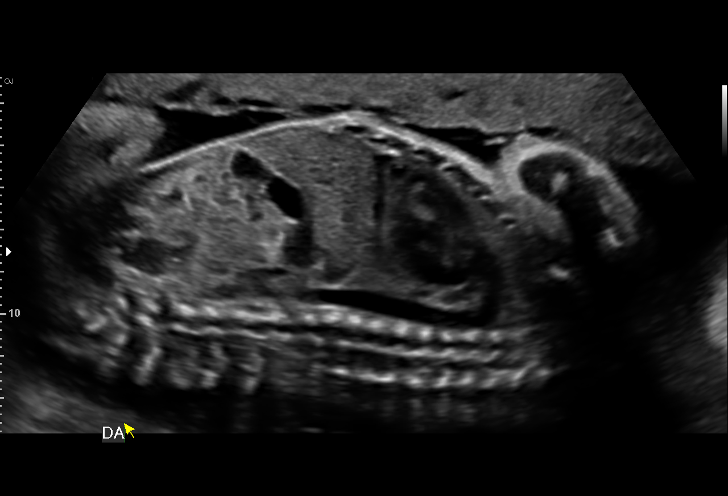
[im 24/72]
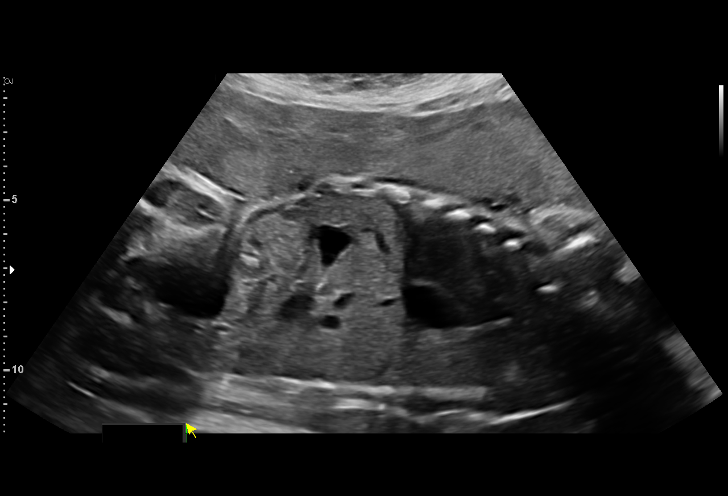
[im 29/72]
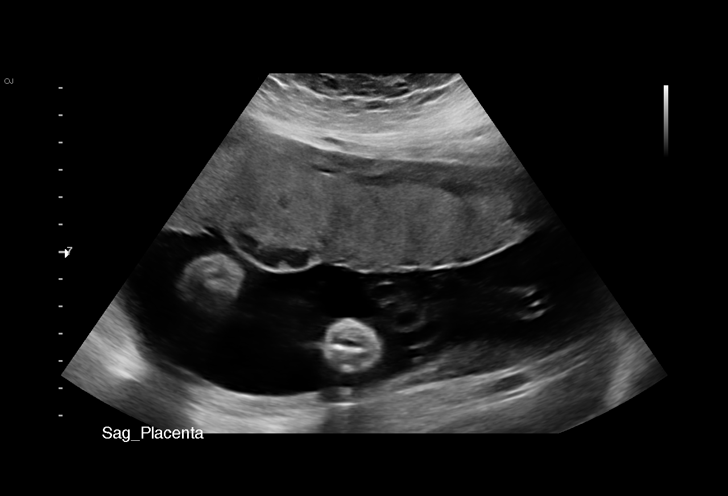
[im 37/72]
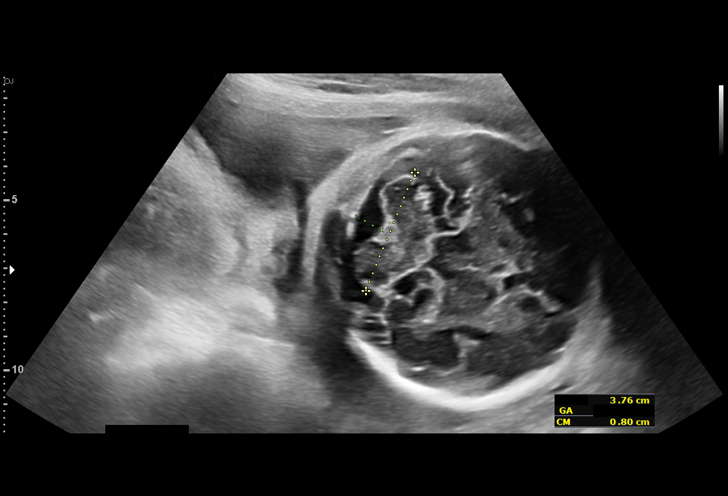
[im 43/72]
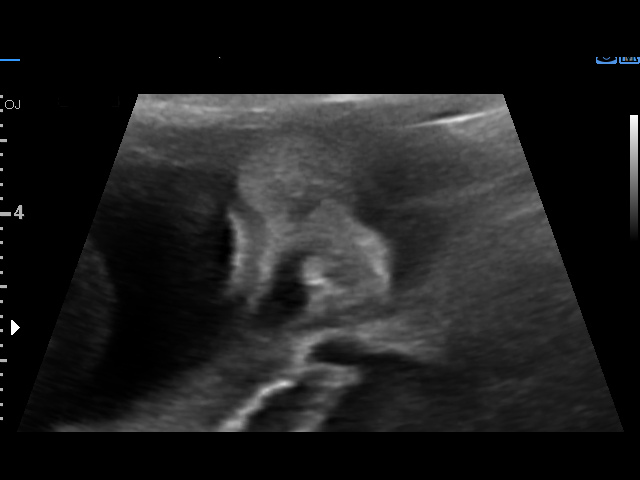
[im 48/72]
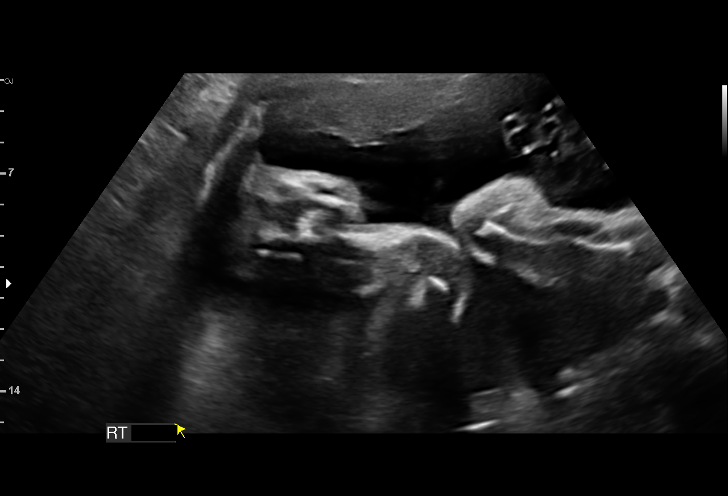
[im 53/72]
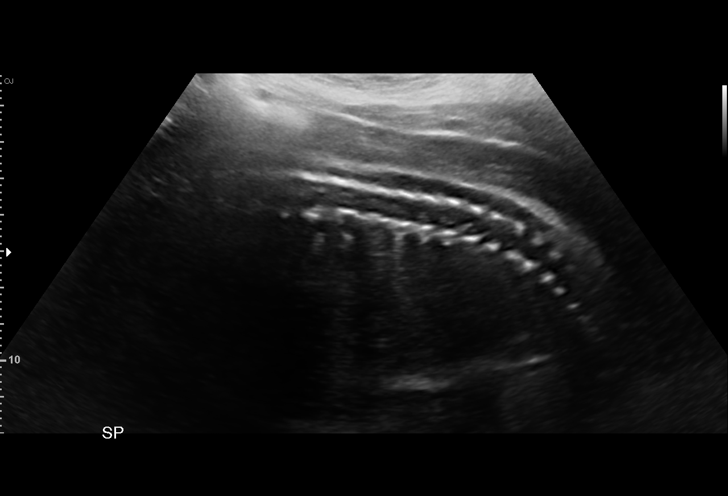
[im 58/72]
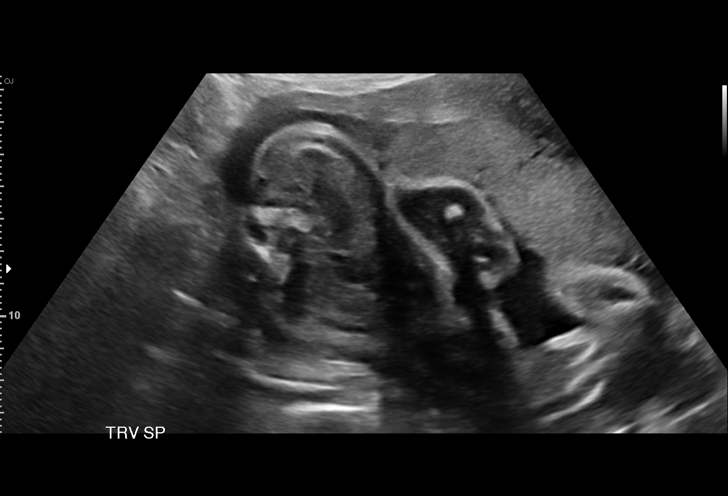
[im 64/72]
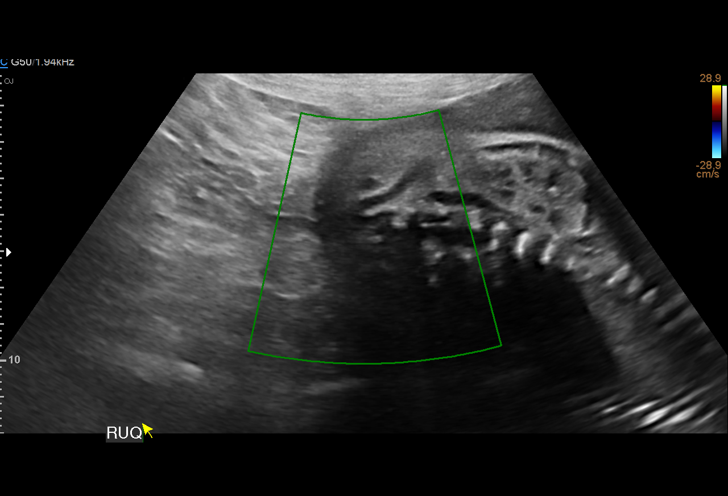
[im 69/72]
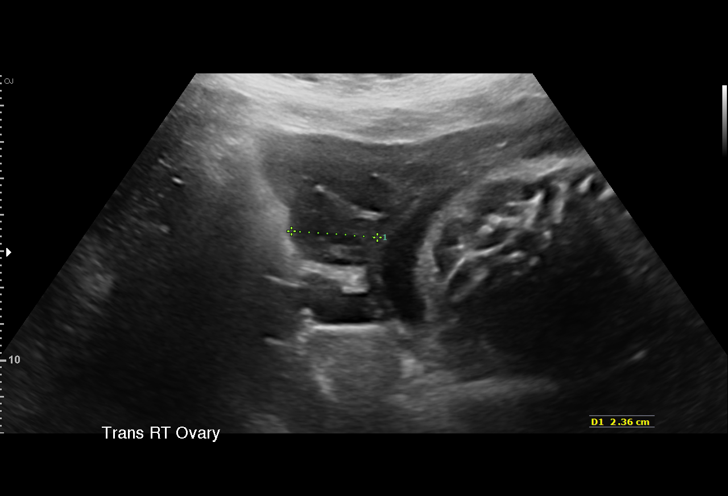

[13 of 28 positions shown; findings below may reference images not displayed]

1  LACEYANN VIIPER         [id number]      [id number]     [id number]
Indications

30 weeks gestation of pregnancy
Encounter for uncertain dates
Late to prenatal care, third trimester
Obesity complicating pregnancy, third
trimester
OB History

Blood Type:            Height:  5'7"   Weight (lb):  265      BMI:
Gravidity:    2         Term:   1        Prem:   0        SAB:   0
TOP:          0       Ectopic:  0        Living: 1
Fetal Evaluation

Num Of Fetuses:     1
Fetal Heart         142
Rate(bpm):
Cardiac Activity:   Observed
Presentation:       Cephalic
Placenta:           Anterior, above cervical os
P. Cord Insertion:  Visualized

Amniotic Fluid
AFI FV:      Subjectively within normal limits

AFI Sum(cm)     %Tile       Largest Pocket(cm)
10.14           15

RUQ(cm)                     LUQ(cm)        LLQ(cm)
3.48
Biometry

BPD:      78.5  mm     G. Age:  31w 4d         79  %    CI:        79.95   %   70 - 86
FL/HC:      20.3   %   19.2 -
HC:      277.4  mm     G. Age:  30w 2d         21  %    HC/AC:      1.12       0.99 -
AC:      246.8  mm     G. Age:  28w 6d         14  %    FL/BPD:     71.6   %   71 - 87
FL:       56.2  mm     G. Age:  29w 4d         21  %    FL/AC:      22.8   %   20 - 24
CER:      37.6  mm     G. Age:  32w 2d         78  %
CM:          8  mm

Est. FW:    4178  gm      3 lb 1 oz     39  %
Gestational Age

LMP:           30w 5d       Date:   06/30/16                 EDD:   04/06/17
U/S Today:     30w 1d                                        EDD:   04/10/17
Best:          30w 1d    Det. By:   U/S (01/31/17)           EDD:   04/10/17
Anatomy

Cranium:               Appears normal         Aortic Arch:            Appears normal
Cavum:                 Appears normal         Ductal Arch:            Appears normal
Ventricles:            Appears normal         Diaphragm:              Appears normal
Choroid Plexus:        Not well visualized    Stomach:                Appears normal, left
sided
Cerebellum:            Appears normal         Abdomen:                Appears normal
Posterior Fossa:       Appears normal         Abdominal Wall:         Not well visualized
Nuchal Fold:           Not applicable (>20    Cord Vessels:           Appears normal (3
wks GA)                                        vessel cord)
Face:                  Orbits nl; profile not Kidneys:                Appear normal
well visualized
Lips:                  Appears normal         Bladder:                Appears normal
Thoracic:              Appears normal         Spine:                  Limited views
appear normal
Heart:                 Appears normal         Upper Extremities:      Appears normal
(4CH, axis, and situs
RVOT:                  Appears normal         Lower Extremities:      Appears normal
LVOT:                  Appears normal

Other:  Male gender. Technically difficult due to maternal habitus and fetal
position.
Cervix Uterus Adnexa

Cervix
Not visualized (advanced GA >85wks)

Uterus
No abnormality visualized.

Left Ovary
Within normal limits.

Right Ovary
Within normal limits.
Impression

Singleton intrauterine pregnancy at 30 +1 weeks by uncertian
LMP, here for dating and anatomic survey
Review of the anatomy shows no sonographic markers for
aneuploidy or structural anomalies
However, intracranial evaluation should be considered
suboptimal secondary to late EGA
Amniotic fluid volume is normal
Biometry confirms menstrual dating
Estimated fetal weight is 1394g which is growth in the 39th
percentile
Recommendations

Normal growth and development
Recommend repeat scan in 4 weeks to assess interval
growth and confirm correct dating

## 2018-07-16 ENCOUNTER — Emergency Department (HOSPITAL_COMMUNITY)
Admission: EM | Admit: 2018-07-16 | Discharge: 2018-07-17 | Disposition: A | Discharge status: Home / Self Care | Payer: Medicaid Other | Attending: Emergency Medicine | Admitting: Emergency Medicine

## 2018-07-16 ENCOUNTER — Encounter (HOSPITAL_COMMUNITY): Payer: Self-pay | Admitting: Emergency Medicine

## 2018-07-16 DIAGNOSIS — R110 Nausea: Secondary | ICD-10-CM | POA: Insufficient documentation

## 2018-07-16 DIAGNOSIS — N946 Dysmenorrhea, unspecified: Secondary | ICD-10-CM | POA: Diagnosis not present

## 2018-07-16 DIAGNOSIS — Z79899 Other long term (current) drug therapy: Secondary | ICD-10-CM | POA: Diagnosis not present

## 2018-07-16 DIAGNOSIS — M79604 Pain in right leg: Secondary | ICD-10-CM | POA: Diagnosis not present

## 2018-07-16 DIAGNOSIS — M79605 Pain in left leg: Secondary | ICD-10-CM | POA: Diagnosis not present

## 2018-07-16 DIAGNOSIS — Z87891 Personal history of nicotine dependence: Secondary | ICD-10-CM | POA: Insufficient documentation

## 2018-07-16 LAB — COMPREHENSIVE METABOLIC PANEL
ALBUMIN: 3.8 g/dL (ref 3.5–5.0)
ALT: 12 U/L (ref 0–44)
AST: 12 U/L — AB (ref 15–41)
Alkaline Phosphatase: 80 U/L (ref 38–126)
Anion gap: 7 (ref 5–15)
BUN: 12 mg/dL (ref 6–20)
CO2: 25 mmol/L (ref 22–32)
Calcium: 8.9 mg/dL (ref 8.9–10.3)
Chloride: 108 mmol/L (ref 98–111)
Creatinine, Ser: 0.75 mg/dL (ref 0.44–1.00)
GFR calc Af Amer: >60 mL/min (ref >60)
GFR calc non Af Amer: >60 mL/min (ref >60)
GLUCOSE: 97 mg/dL (ref 70–99)
POTASSIUM: 4 mmol/L (ref 3.5–5.1)
Sodium: 140 mmol/L (ref 135–145)
Total Bilirubin: 0.2 mg/dL — ABNORMAL LOW (ref 0.3–1.2)
Total Protein: 7.2 g/dL (ref 6.5–8.1)

## 2018-07-16 LAB — CBC
HEMATOCRIT: 37.4 % (ref 36.0–46.0)
HEMOGLOBIN: 12.6 g/dL (ref 12.0–15.0)
MCH: 25.7 pg — AB (ref 26.0–34.0)
MCHC: 33.7 g/dL (ref 30.0–36.0)
MCV: 76.3 fL — ABNORMAL LOW (ref 80.0–100.0)
Platelets: 346 10*3/uL (ref 150–400)
RBC: 4.9 MIL/uL (ref 3.87–5.11)
RDW: 14.6 % (ref 11.5–15.5)
WBC: 6.8 10*3/uL (ref 4.0–10.5)
nRBC: 0 % (ref 0.0–0.2)

## 2018-07-16 LAB — URINALYSIS, ROUTINE W REFLEX MICROSCOPIC
Bilirubin Urine: NEGATIVE
Glucose, UA: NEGATIVE mg/dL
Ketones, ur: NEGATIVE mg/dL
Leukocytes, UA: NEGATIVE
NITRITE: NEGATIVE
PROTEIN: NEGATIVE mg/dL
RBC / HPF: >50 RBC/hpf — ABNORMAL HIGH (ref 0–5)
Specific Gravity, Urine: 1.028 (ref 1.005–1.030)
pH: 5 (ref 5.0–8.0)

## 2018-07-16 LAB — I-STAT BETA HCG BLOOD, ED (MC, WL, AP ONLY): I-stat hCG, quantitative: <5 m[IU]/mL (ref <5)

## 2018-07-16 LAB — LIPASE, BLOOD: LIPASE: 21 U/L (ref 11–51)

## 2018-07-16 MED ORDER — ONDANSETRON 8 MG PO TBDP
8.0000 mg | ORAL_TABLET | Freq: Once | ORAL | Status: AC
  Administered 2018-07-16: 8 mg via ORAL
  Filled 2018-07-16: qty 1

## 2018-07-16 MED ORDER — ONDANSETRON 4 MG PO TBDP: 4.0000 mg | ORAL_TABLET | Freq: Three times a day (TID) | ORAL | 0 refills | Status: DC | PRN
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
## 2018-07-16 NOTE — ED Triage Notes (Signed)
Patient here from home with complaints of abd pain x3 days. Nausea, vomiting. Denies pregnancy.

## 2018-08-01 ENCOUNTER — Ambulatory Visit (HOSPITAL_COMMUNITY)
Admission: EM | Admit: 2018-08-01 | Discharge: 2018-08-01 | Disposition: A | Discharge status: Home / Self Care | Payer: Medicaid Other | Attending: Family Medicine | Admitting: Family Medicine

## 2018-08-01 ENCOUNTER — Ambulatory Visit: Payer: Self-pay | Admitting: Family Medicine

## 2018-08-01 ENCOUNTER — Encounter (HOSPITAL_COMMUNITY): Payer: Self-pay | Admitting: Emergency Medicine

## 2018-08-01 DIAGNOSIS — B9689 Other specified bacterial agents as the cause of diseases classified elsewhere: Secondary | ICD-10-CM | POA: Insufficient documentation

## 2018-08-01 DIAGNOSIS — N760 Acute vaginitis: Secondary | ICD-10-CM | POA: Insufficient documentation

## 2018-08-01 DIAGNOSIS — R109 Unspecified abdominal pain: Secondary | ICD-10-CM | POA: Diagnosis present

## 2018-08-01 LAB — POCT URINALYSIS DIP (DEVICE)
Bilirubin Urine: NEGATIVE
GLUCOSE, UA: NEGATIVE mg/dL
KETONES UR: NEGATIVE mg/dL
Leukocytes, UA: NEGATIVE
Nitrite: NEGATIVE
PH: 7 (ref 5.0–8.0)
PROTEIN: NEGATIVE mg/dL
SPECIFIC GRAVITY, URINE: 1.02 (ref 1.005–1.030)
UROBILINOGEN UA: 1 mg/dL (ref 0.0–1.0)

## 2018-08-01 LAB — POCT PREGNANCY, URINE: Preg Test, Ur: NEGATIVE

## 2018-08-01 MED ORDER — METRONIDAZOLE 500 MG PO TABS: 500.0000 mg | ORAL_TABLET | Freq: Two times a day (BID) | ORAL | 0 refills | Status: DC

## 2018-08-01 NOTE — Discharge Instructions (Signed)
You have been seen today for abdominal pain. Your evaluation was not suggestive of any emergent condition requiring medical intervention at this time. However, some abdominal problems make take more time to appear. Therefore, it is important for you to watch for any new symptoms or worsening of your current condition. Please return here or to the Emergency Department immediately should you feel worse in any way or have any of the following symptoms: increasing or different abdominal pain, persistent vomiting, fevers, or shaking chills.   We have sent testing for sexually transmitted infections. We will notify you of any positive results once they are received. If required, we will prescribe any medications you might need.  Please refrain from all sexual activity for at least the next seven days.

## 2018-08-01 NOTE — ED Provider Notes (Signed)
[facility]   [id number] 08/01/18 Arrival Time: 1353  ASSESSMENT & PLAN:  1. Abdominal cramping   2. BV (bacterial vaginosis)   - history of recurrent BV; desires empiric treatment  Meds ordered this encounter  Medications  . metroNIDAZOLE (FLAGYL) 500 MG tablet    Sig: Take 1 tablet (500 mg total) by mouth 2 (two) times daily.    Dispense:  14 tablet    Refill:  0   Hematuria likely from vaginal spotting related to birth control implant. UPT negative.  Urine cytology pending. Will call her with any positive results.  Discharge Instructions     You have been seen today for abdominal pain. Your evaluation was not suggestive of any emergent condition requiring medical intervention at this time. However, some abdominal problems make take more time to appear. Therefore, it is important for you to watch for any new symptoms or worsening of your current condition. Please return here or to the Emergency Department immediately should you feel worse in any way or have any of the following symptoms: increasing or different abdominal pain, persistent vomiting, fevers, or shaking chills.   We have sent testing for sexually transmitted infections. We will notify you of any positive results once they are received. If required, we will prescribe any medications you might need.  Please refrain from all sexual activity for at least the next seven days.  Reviewed expectations re: course of current medical issues. Questions answered. Outlined signs and symptoms indicating need for more acute intervention. Patient verbalized understanding. After Visit Summary given.   SUBJECTIVE: Seen in ED on 07/16/2018; note and lab results reviewed.  Kelly Hensley is a 22 y.o. female who presents with complaint of intermittent abdominal discomfort. Onset gradual, over the past two months. Location: lower abdomen without radiation. Described as cramping. Symptoms are stable since beginning.  Aggravating factors: none. Alleviating factors: none. Associated symptoms: slight thin vaginal discharge over the past couple of weeks. She denies anorexia, arthralgias, belching, chills, constipation, diarrhea, fever, hematuria, myalgias, nausea, sweats and vomiting. Appetite: normal. PO intake: normal. Ambulatory without assistance. Urinary symptoms: none. Normal and fairly regular bowel movements without blood. No h/o constipation. OTC treatment: none reported.  No LMP recorded. Patient has had an implant. But has been spotting recently.  Past Surgical History:  Procedure Laterality Date  . ABCESS DRAINAGE Left 2014   ROS: As per HPI. All other systems negative.  OBJECTIVE:  Vitals:   08/01/18 1457  BP: 131/72  Pulse: 78  Resp: 18  Temp: 98.5 F (36.9 C)  TempSrc: Oral  SpO2: 100%    General appearance: alert; no distress Lungs: clear to auscultation bilaterally Heart: regular rate and rhythm without murmer Abdomen: soft; non-distended; mild lower abdominal tenderness that is poorly localized; bowel sounds present; no masses or organomegaly; no guarding or rebound tenderness; no inguinal LAD appreciated GU: declines Back: no CVA tenderness; FROM at hips Extremities: no edema; symmetrical with no gross deformities Skin: warm and dry Neurologic: normal gait Psychological: alert and cooperative; normal mood and affect  Labs: Results for orders placed or performed during the hospital encounter of 08/01/18  POCT urinalysis dip (device)  Result Value Ref Range   Glucose, UA NEGATIVE NEGATIVE mg/dL   Bilirubin Urine NEGATIVE NEGATIVE   Ketones, ur NEGATIVE NEGATIVE mg/dL   Specific Gravity, Urine 1.020 1.005 - 1.030   Hgb urine dipstick LARGE (A) NEGATIVE   pH 7.0 5.0 - 8.0   Protein, ur NEGATIVE NEGATIVE mg/dL  Urobilinogen, UA 1.0 0.0 - 1.0 mg/dL   Nitrite NEGATIVE NEGATIVE   Leukocytes, UA NEGATIVE NEGATIVE  Pregnancy, urine POC  Result Value Ref Range   Preg Test,  Ur NEGATIVE NEGATIVE   Labs Reviewed  POCT URINALYSIS DIP (DEVICE) - Abnormal; Notable for the following components:      Result Value   Hgb urine dipstick LARGE (*)    All other components within normal limits  POCT PREGNANCY, URINE  CERVICOVAGINAL ANCILLARY ONLY    No Known Allergies                                             Past Medical History:  Diagnosis Date  . Anemia   . Cellulitis and abscess of trunk   . 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 Migraines   . Morbid obesity (HCC)   . Sickle cell trait (HCC)    Social History   Socioeconomic History  . Marital status: Single    Spouse name: Not on file  . Number of children: Not on file  . Years of education: Not on file  . Highest education level: Not on file  Occupational History  . Not on file  Social Needs  . Financial resource strain: Not on file  . Food insecurity:    Worry: Not on file    Inability: Not on file  . Transportation needs:    Medical: Not on file    Non-medical: Not on file  Tobacco Use  . Smoking status: Former Smoker    Packs/day: 0.25    Types: Cigarettes    Last attempt to quit: 02/17/2015    Years since quitting: 3.4  . Smokeless tobacco: Never Used  Substance and Sexual Activity  . Alcohol use: No  . Drug use: No    Comment: denies but smell marijuana  . Sexual activity: Yes    Birth control/protection: Implant  Lifestyle  . Physical activity:    Days per week: Not on file    Minutes per session: Not on file  . Stress: Not on file  Relationships  . Social connections:    Talks on phone: Not on file    Gets together: Not on file    Attends religious service: Not on file    Active member of club or organization: Not on file    Attends meetings of clubs or organizations: Not on file    Relationship status: Not on file  . Intimate partner violence:    Fear of current or ex partner: Not on file    Emotionally abused: Not on file    Physically abused:  Not on file    Forced sexual activity: Not on file  Other Topics Concern  . Not on file  Social History Narrative  . Not on file   Family History  Problem Relation Age of Onset  . Diabetes Mother   . Sickle cell anemia Father      Kelly Hensley, Kelly Issa, MD 08/06/18 [phone]

## 2018-08-01 NOTE — ED Triage Notes (Signed)
Pt c/o abdominal pain, cramping x2 months. Worried about BV.

## 2018-08-02 LAB — CERVICOVAGINAL ANCILLARY ONLY
Bacterial vaginitis: POSITIVE — AB
Candida vaginitis: NEGATIVE
Chlamydia: NEGATIVE
NEISSERIA GONORRHEA: NEGATIVE
TRICH (WINDOWPATH): NEGATIVE

## 2018-09-26 ENCOUNTER — Ambulatory Visit: Payer: Medicaid Other | Admitting: Obstetrics and Gynecology

## 2018-09-27 ENCOUNTER — Ambulatory Visit (INDEPENDENT_AMBULATORY_CARE_PROVIDER_SITE_OTHER): Payer: Medicaid Other | Admitting: Advanced Practice Midwife

## 2018-09-27 ENCOUNTER — Ambulatory Visit (HOSPITAL_COMMUNITY): Admission: RE | Admit: 2018-09-27 | Payer: Medicaid Other | Source: Ambulatory Visit

## 2018-09-27 ENCOUNTER — Encounter: Payer: Self-pay | Admitting: Advanced Practice Midwife

## 2018-09-27 VITALS — BP 124/83 | HR 76 | Wt 241.0 lb

## 2018-09-27 DIAGNOSIS — N939 Abnormal uterine and vaginal bleeding, unspecified: Secondary | ICD-10-CM

## 2018-09-27 DIAGNOSIS — K0889 Other specified disorders of teeth and supporting structures: Secondary | ICD-10-CM

## 2018-09-27 DIAGNOSIS — B9689 Other specified bacterial agents as the cause of diseases classified elsewhere: Secondary | ICD-10-CM

## 2018-09-27 DIAGNOSIS — N760 Acute vaginitis: Secondary | ICD-10-CM

## 2018-09-27 MED ORDER — METRONIDAZOLE 500 MG PO TABS: 500.0000 mg | ORAL_TABLET | Freq: Two times a day (BID) | ORAL | 1 refills | Status: DC

## 2018-09-27 MED ORDER — IBUPROFEN 600 MG PO TABS: 600.0000 mg | ORAL_TABLET | Freq: Four times a day (QID) | ORAL | 1 refills | Status: DC | PRN

## 2018-09-27 MED ORDER — NORGESTIMATE-ETH ESTRADIOL 0.25-35 MG-MCG PO TABS: 1.0000 | ORAL_TABLET | Freq: Every day | ORAL | 2 refills | Status: DC

## 2018-09-27 NOTE — Progress Notes (Signed)
6 months of bleeding / Family Hx of Fibroids, Wants U/S before Implants is removed, to be sure its not the implant that's causing the problems.

## 2018-09-27 NOTE — Patient Instructions (Signed)
Abnormal Uterine Bleeding  Abnormal uterine bleeding is unusual bleeding from the uterus. It includes:   Bleeding or spotting between periods.   Bleeding after sex.   Bleeding that is heavier than normal.   Periods that last longer than usual.   Bleeding after menopause.  Abnormal uterine bleeding can affect women at various stages in life, including teenagers, women in their reproductive years, pregnant women, and women who have reached menopause. Common causes of abnormal uterine bleeding include:   Pregnancy.   Growths of tissue (polyps).   A noncancerous tumor in the uterus (fibroid).   Infection.   Cancer.   Hormonal imbalances.  Any type of abnormal bleeding should be evaluated by a health care provider. Many cases are minor and simple to treat, while others are more serious. Treatment will depend on the cause of the bleeding.  Follow these instructions at home:   Monitor your condition for any changes.   Do not use tampons, douche, or have sex if told by your health care provider.   Change your pads often.   Get regular exams that include pelvic exams and cervical cancer screening.   Keep all follow-up visits as told by your health care provider. This is important.  Contact a health care provider if:   Your bleeding lasts for more than one week.   You feel dizzy at times.   You feel nauseous or you vomit.  Get help right away if:   You pass out.   Your bleeding soaks through a pad every hour.   You have abdominal pain.   You have a fever.   You become sweaty or weak.   You pass large blood clots from your vagina.  Summary   Abnormal uterine bleeding is unusual bleeding from the uterus.   Any type of abnormal bleeding should be evaluated by a health care provider. Many cases are minor and simple to treat, while others are more serious.   Treatment will depend on the cause of the bleeding.  This information is not intended to replace advice given to you by your health care provider.  Make sure you discuss any questions you have with your health care provider.  Document Released: 09/04/2005 Document Revised: 10/06/2016 Document Reviewed: 10/06/2016  Elsevier Interactive Patient Education  2019 [employer]vier Inc[employer]

## 2018-09-27 NOTE — Progress Notes (Signed)
   Subjective:    Patient ID: Kelly Hensley, female    DOB: 1996-05-21, 23 y.o.   MRN: [id number]  HPI: Here for AUB x 6 months. Bleeding is intermittent, light-moderate. Is currently light. Has had Nexplanon in place 2018. Here to discuss possible removal due to AUB, but would like keep it if possible. Hasn't tried any treatment for bleeding. Is also concerned because she has a strong family Hx of fibroids and wonders if that would cause the AUB.     Review of Systems  Constitutional: Negative for chills, fatigue and fever.  Gastrointestinal: Negative for abdominal pain.  Genitourinary: Positive for menstrual problem, vaginal bleeding and vaginal discharge (C/W BV, which she gets a few x per yr.). Negative for pelvic pain.  Neurological: Negative for dizziness and weakness.       Objective:   Physical Exam Constitutional:      General: She is not in acute distress.    Appearance: Normal appearance. She is obese.  Cardiovascular:     Rate and Rhythm: Normal rate.  Pulmonary:     Effort: Pulmonary effort is normal. No respiratory distress.  Abdominal:     General: Abdomen is flat.     Tenderness: There is no abdominal tenderness. There is no guarding.  Genitourinary:    Comments: declined Skin:    General: Skin is warm and dry.     Coloration: Skin is not pale.  Neurological:     Mental Status: She is alert.        Assessment & Plan:  1. Abnormal uterine bleeding (AUB) - Would like to try OCPs for AUB before removing Nexplanon - US PELVIC COMPLETE WITH TRANSVAGINAL; Future - Declined H/H   2. Pain, dental - Has appt w/ dentist.  - ibuprofen (ADVIL,MOTRIN) 600 MG tablet; Take 1 tablet (600 mg total) by mouth every 6 (six) hours as needed.  Dispense: 30 tablet; Refill: 1  3. BV (bacterial vaginosis)  - metroNIDAZOLE (FLAGYL) 500 MG tablet; Take 1 tablet (500 mg total) by mouth 2 (two) times daily.  Dispense: 14 tablet; Refill: 1 - Recommend further testing if no  improvement in discharge w/ Flagyl. - Declined GC/Chlmaydia, wet prep or speculum exam.   F/U 3-4 weeks   [place], [place], CNM 09/27/2018 10:12 AM

## 2018-10-01 ENCOUNTER — Telehealth: Payer: Self-pay | Admitting: Family Medicine

## 2018-10-01 NOTE — Telephone Encounter (Signed)
Attempted to call patient with her follow up appt. No answer and stated that call could not be completed. Letter mailed about appt

## 2018-10-15 ENCOUNTER — Telehealth: Payer: Self-pay

## 2018-10-15 ENCOUNTER — Telehealth: Payer: Self-pay | Admitting: Advanced Practice Midwife

## 2018-10-15 NOTE — Telephone Encounter (Signed)
Attempted to contact pt to let her know that her appt on 1/31 has been canceled. No answer, and number stated that the called could not be completed and to try again later.

## 2018-10-15 NOTE — Telephone Encounter (Signed)
Pts [place] has been rescheduled for 10/23/18 @ 10am , arrival time 9:45 w/ full Bladder. No answer, no VM, only rvcd this message, " Call cannot be completed at this time .Please hang up and call back later."

## 2018-10-15 NOTE — Telephone Encounter (Signed)
-----   Message from York Ram, [place] sent at 10/15/2018  1:16 PM EST ----- Regarding: FW: Needs [place] prior to F/U appt.  ----- Message ----- From: Dorathy Kinsman, CNM Sent: 10/15/2018  12:02 PM EST To: [facility] Admin Pool Subject: Needs [place] prior to F/U appt.                    Pt needs [place] prior to F/U appt because findings will determine POC. [facility] for [place]. Please reschedule [place] and ?FU appt for afterward.

## 2018-10-18 ENCOUNTER — Ambulatory Visit: Payer: Medicaid Other | Admitting: Advanced Practice Midwife

## 2018-10-23 ENCOUNTER — Ambulatory Visit (HOSPITAL_COMMUNITY): Admission: RE | Admit: 2018-10-23 | Payer: Medicaid Other | Source: Ambulatory Visit

## 2018-10-28 ENCOUNTER — Ambulatory Visit: Payer: Medicaid Other | Admitting: Family Medicine

## 2019-02-27 ENCOUNTER — Other Ambulatory Visit: Payer: Self-pay | Admitting: *Deleted

## 2019-02-27 DIAGNOSIS — Z20822 Contact with and (suspected) exposure to covid-19: Secondary | ICD-10-CM

## 2019-03-02 LAB — NOVEL CORONAVIRUS, NAA: SARS-CoV-2, NAA: NOT DETECTED

## 2019-04-03 ENCOUNTER — Other Ambulatory Visit: Payer: Self-pay | Admitting: *Deleted

## 2019-04-03 DIAGNOSIS — Z20822 Contact with and (suspected) exposure to covid-19: Secondary | ICD-10-CM

## 2019-04-08 LAB — NOVEL CORONAVIRUS, NAA: SARS-CoV-2, NAA: NOT DETECTED

## 2019-12-01 ENCOUNTER — Encounter (HOSPITAL_COMMUNITY): Payer: Self-pay | Admitting: Emergency Medicine

## 2019-12-01 ENCOUNTER — Emergency Department (HOSPITAL_COMMUNITY)
Admission: EM | Admit: 2019-12-01 | Discharge: 2019-12-01 | Disposition: A | Discharge status: Home / Self Care | Payer: Medicaid Other | Attending: Emergency Medicine | Admitting: Emergency Medicine

## 2019-12-01 ENCOUNTER — Other Ambulatory Visit: Payer: Self-pay

## 2019-12-01 DIAGNOSIS — Z87891 Personal history of nicotine dependence: Secondary | ICD-10-CM | POA: Diagnosis not present

## 2019-12-01 DIAGNOSIS — N898 Other specified noninflammatory disorders of vagina: Secondary | ICD-10-CM | POA: Insufficient documentation

## 2019-12-01 DIAGNOSIS — Z79899 Other long term (current) drug therapy: Secondary | ICD-10-CM | POA: Diagnosis not present

## 2019-12-01 DIAGNOSIS — Z793 Long term (current) use of hormonal contraceptives: Secondary | ICD-10-CM | POA: Diagnosis not present

## 2019-12-01 DIAGNOSIS — Z202 Contact with and (suspected) exposure to infections with a predominantly sexual mode of transmission: Secondary | ICD-10-CM

## 2019-12-01 DIAGNOSIS — B373 Candidiasis of vulva and vagina: Secondary | ICD-10-CM | POA: Insufficient documentation

## 2019-12-01 DIAGNOSIS — B3731 Acute candidiasis of vulva and vagina: Secondary | ICD-10-CM

## 2019-12-01 LAB — URINALYSIS, ROUTINE W REFLEX MICROSCOPIC
Bilirubin Urine: NEGATIVE
Glucose, UA: NEGATIVE mg/dL
Hgb urine dipstick: NEGATIVE
Ketones, ur: NEGATIVE mg/dL
Leukocytes,Ua: NEGATIVE
Nitrite: NEGATIVE
Protein, ur: NEGATIVE mg/dL
Specific Gravity, Urine: 1.03 (ref 1.005–1.030)
pH: 6 (ref 5.0–8.0)

## 2019-12-01 LAB — WET PREP, GENITAL
Clue Cells Wet Prep HPF POC: NONE SEEN
Sperm: NONE SEEN
Trich, Wet Prep: NONE SEEN

## 2019-12-01 LAB — I-STAT BETA HCG BLOOD, ED (MC, WL, AP ONLY): I-stat hCG, quantitative: <5 m[IU]/mL (ref <5)

## 2019-12-01 MED ORDER — FLUCONAZOLE 150 MG PO TABS
150.0000 mg | ORAL_TABLET | Freq: Once | ORAL | Status: AC
  Administered 2019-12-01: 20:00:00 150 mg via ORAL
  Filled 2019-12-01: qty 1

## 2019-12-01 MED ORDER — FLUCONAZOLE 150 MG PO TABS: ORAL_TABLET | ORAL | 0 refills | Status: DC

## 2019-12-01 NOTE — ED Notes (Signed)
Pt was discharged from the ED. Pt read and understood discharge paperwork. Pt had vital signs completed. Pt conscious, breathing, and A&Ox4. No distress noted. Pt speaking in complete sentences. Pt ambulated out of the ED with a smooth and steady gait. E-signature not available.  
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
## 2019-12-01 NOTE — ED Triage Notes (Signed)
Pt. Stated, I just want to be checked for STD, no symptoms

## 2019-12-01 NOTE — Discharge Instructions (Signed)
You were treated with Diflucan today for a yeast infection if you are continuing to have discharge, discomfort or symptoms please take a second dose of Diflucan 3 days later.  You have STD testing pending and will be called by phone if results are positive, if results are negative you can view them online via MyChart.  Please follow-up with OB/GYN for future STD testing needs, or you can also go to your primary care doctor or the health department.   If any of your results are positive you will need tell your partner so that they can be tested and treated as well.  If any of your test results are positive you should avoid sexual activity until both you and your partner have been treated.

## 2019-12-03 LAB — GC/CHLAMYDIA PROBE AMP (~~LOC~~) NOT AT ARMC
Chlamydia: NEGATIVE
Neisseria Gonorrhea: NEGATIVE

## 2020-03-24 ENCOUNTER — Ambulatory Visit: Payer: Medicaid Other

## 2020-03-27 ENCOUNTER — Other Ambulatory Visit: Payer: Self-pay

## 2020-03-27 ENCOUNTER — Emergency Department (HOSPITAL_COMMUNITY)
Admission: EM | Admit: 2020-03-27 | Discharge: 2020-03-27 | Disposition: A | Discharge status: Home / Self Care | Payer: Medicaid Other | Attending: Emergency Medicine | Admitting: Emergency Medicine

## 2020-03-27 ENCOUNTER — Encounter (HOSPITAL_COMMUNITY): Payer: Self-pay | Admitting: Emergency Medicine

## 2020-03-27 DIAGNOSIS — N760 Acute vaginitis: Secondary | ICD-10-CM | POA: Insufficient documentation

## 2020-03-27 DIAGNOSIS — N898 Other specified noninflammatory disorders of vagina: Secondary | ICD-10-CM | POA: Diagnosis present

## 2020-03-27 DIAGNOSIS — B9689 Other specified bacterial agents as the cause of diseases classified elsewhere: Secondary | ICD-10-CM | POA: Insufficient documentation

## 2020-03-27 DIAGNOSIS — Z87891 Personal history of nicotine dependence: Secondary | ICD-10-CM | POA: Diagnosis not present

## 2020-03-27 LAB — COMPREHENSIVE METABOLIC PANEL
ALT: 10 U/L (ref 0–44)
AST: 11 U/L — ABNORMAL LOW (ref 15–41)
Albumin: 3.8 g/dL (ref 3.5–5.0)
Alkaline Phosphatase: 60 U/L (ref 38–126)
Anion gap: 8 (ref 5–15)
BUN: 6 mg/dL (ref 6–20)
CO2: 25 mmol/L (ref 22–32)
Calcium: 8.9 mg/dL (ref 8.9–10.3)
Chloride: 106 mmol/L (ref 98–111)
Creatinine, Ser: 0.69 mg/dL (ref 0.44–1.00)
GFR calc Af Amer: >60 mL/min (ref >60)
GFR calc non Af Amer: >60 mL/min (ref >60)
Glucose, Bld: 122 mg/dL — ABNORMAL HIGH (ref 70–99)
Potassium: 4 mmol/L (ref 3.5–5.1)
Sodium: 139 mmol/L (ref 135–145)
Total Bilirubin: 0.6 mg/dL (ref 0.3–1.2)
Total Protein: 7.6 g/dL (ref 6.5–8.1)

## 2020-03-27 LAB — URINALYSIS, ROUTINE W REFLEX MICROSCOPIC
Bacteria, UA: NONE SEEN
Glucose, UA: NEGATIVE mg/dL
Hgb urine dipstick: NEGATIVE
Ketones, ur: 20 mg/dL — AB
Leukocytes,Ua: NEGATIVE
Nitrite: NEGATIVE
Protein, ur: 100 mg/dL — AB
Specific Gravity, Urine: 1.027 (ref 1.005–1.030)
pH: 6 (ref 5.0–8.0)

## 2020-03-27 LAB — CBC
HCT: 39 % (ref 36.0–46.0)
Hemoglobin: 13.1 g/dL (ref 12.0–15.0)
MCH: 24.7 pg — ABNORMAL LOW (ref 26.0–34.0)
MCHC: 33.6 g/dL (ref 30.0–36.0)
MCV: 73.4 fL — ABNORMAL LOW (ref 80.0–100.0)
Platelets: 348 10*3/uL (ref 150–400)
RBC: 5.31 MIL/uL — ABNORMAL HIGH (ref 3.87–5.11)
RDW: 14.9 % (ref 11.5–15.5)
WBC: 4 10*3/uL (ref 4.0–10.5)
nRBC: 0 % (ref 0.0–0.2)

## 2020-03-27 LAB — WET PREP, GENITAL
Sperm: NONE SEEN
Trich, Wet Prep: NONE SEEN
Yeast Wet Prep HPF POC: NONE SEEN

## 2020-03-27 LAB — RAPID HIV SCREEN (HIV 1/2 AB+AG)
HIV 1/2 Antibodies: NONREACTIVE
HIV-1 P24 Antigen - HIV24: NONREACTIVE

## 2020-03-27 LAB — I-STAT BETA HCG BLOOD, ED (MC, WL, AP ONLY): I-stat hCG, quantitative: <5 m[IU]/mL (ref <5)

## 2020-03-27 LAB — LIPASE, BLOOD: Lipase: 23 U/L (ref 11–51)

## 2020-03-27 MED ORDER — SODIUM CHLORIDE 0.9% FLUSH: 3.0000 mL | Freq: Once | INTRAVENOUS | Status: DC

## 2020-03-27 MED ORDER — METRONIDAZOLE 500 MG PO TABS: 500.0000 mg | ORAL_TABLET | Freq: Two times a day (BID) | ORAL | 0 refills | Status: DC

## 2020-03-27 NOTE — ED Triage Notes (Signed)
Pt having abd cramping and vaginal discharge with foul odor for over week. Wants STD testing

## 2020-03-27 NOTE — Discharge Instructions (Signed)
Take Flagyl twice a day for one week. Please take with food and do not drink alcohol while taking this medicine Your STD testing was sent off and will come back in 2-3 days. You will be called if this is abnormal but you can check your results on MyChart Return if you are worsening
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
## 2020-03-28 LAB — RPR: RPR Ser Ql: NONREACTIVE

## 2020-03-29 ENCOUNTER — Ambulatory Visit: Payer: Medicaid Other

## 2020-03-29 LAB — GC/CHLAMYDIA PROBE AMP (~~LOC~~) NOT AT ARMC
Chlamydia: NEGATIVE
Comment: NEGATIVE
Comment: NORMAL
Neisseria Gonorrhea: NEGATIVE

## 2020-04-08 ENCOUNTER — Ambulatory Visit: Payer: Medicaid Other

## 2021-01-08 ENCOUNTER — Emergency Department (HOSPITAL_COMMUNITY)
Admission: EM | Admit: 2021-01-08 | Discharge: 2021-01-10 | Disposition: A | Discharge status: Home / Self Care | Payer: Medicaid Other | Attending: Student | Admitting: Student

## 2021-01-08 ENCOUNTER — Encounter (HOSPITAL_COMMUNITY): Payer: Self-pay

## 2021-01-08 DIAGNOSIS — Z87891 Personal history of nicotine dependence: Secondary | ICD-10-CM | POA: Diagnosis not present

## 2021-01-08 DIAGNOSIS — Z20822 Contact with and (suspected) exposure to covid-19: Secondary | ICD-10-CM | POA: Diagnosis not present

## 2021-01-08 DIAGNOSIS — R45851 Suicidal ideations: Secondary | ICD-10-CM | POA: Insufficient documentation

## 2021-01-08 DIAGNOSIS — F1124 Opioid dependence with opioid-induced mood disorder: Secondary | ICD-10-CM | POA: Diagnosis present

## 2021-01-08 LAB — CBC WITH DIFFERENTIAL/PLATELET
Abs Immature Granulocytes: 0.02 10*3/uL (ref 0.00–0.07)
Basophils Absolute: 0 10*3/uL (ref 0.0–0.1)
Basophils Relative: 0 %
Eosinophils Absolute: 0 10*3/uL (ref 0.0–0.5)
Eosinophils Relative: 0 %
HCT: 34.1 % — ABNORMAL LOW (ref 36.0–46.0)
Hemoglobin: 11.7 g/dL — ABNORMAL LOW (ref 12.0–15.0)
Immature Granulocytes: 0 %
Lymphocytes Relative: 23 %
Lymphs Abs: 1.6 10*3/uL (ref 0.7–4.0)
MCH: 26.4 pg (ref 26.0–34.0)
MCHC: 34.3 g/dL (ref 30.0–36.0)
MCV: 77 fL — ABNORMAL LOW (ref 80.0–100.0)
Monocytes Absolute: 0.5 10*3/uL (ref 0.1–1.0)
Monocytes Relative: 7 %
Neutro Abs: 5 10*3/uL (ref 1.7–7.7)
Neutrophils Relative %: 70 %
Platelets: 307 10*3/uL (ref 150–400)
RBC: 4.43 MIL/uL (ref 3.87–5.11)
RDW: 14.4 % (ref 11.5–15.5)
WBC: 7.2 10*3/uL (ref 4.0–10.5)
nRBC: 0 % (ref 0.0–0.2)

## 2021-01-08 LAB — COMPREHENSIVE METABOLIC PANEL
ALT: 9 U/L (ref 0–44)
AST: 9 U/L — ABNORMAL LOW (ref 15–41)
Albumin: 3.8 g/dL (ref 3.5–5.0)
Alkaline Phosphatase: 54 U/L (ref 38–126)
Anion gap: 9 (ref 5–15)
BUN: 8 mg/dL (ref 6–20)
CO2: 21 mmol/L — ABNORMAL LOW (ref 22–32)
Calcium: 8.9 mg/dL (ref 8.9–10.3)
Chloride: 106 mmol/L (ref 98–111)
Creatinine, Ser: 0.78 mg/dL (ref 0.44–1.00)
GFR, Estimated: >60 mL/min (ref >60)
Glucose, Bld: 86 mg/dL (ref 70–99)
Potassium: 4.1 mmol/L (ref 3.5–5.1)
Sodium: 136 mmol/L (ref 135–145)
Total Bilirubin: 0.4 mg/dL (ref 0.3–1.2)
Total Protein: 6.7 g/dL (ref 6.5–8.1)

## 2021-01-08 LAB — RAPID URINE DRUG SCREEN, HOSP PERFORMED
Amphetamines: POSITIVE — AB
Barbiturates: NOT DETECTED
Benzodiazepines: POSITIVE — AB
Cocaine: NOT DETECTED
Opiates: POSITIVE — AB
Tetrahydrocannabinol: NOT DETECTED

## 2021-01-08 LAB — I-STAT BETA HCG BLOOD, ED (MC, WL, AP ONLY): I-stat hCG, quantitative: <5 m[IU]/mL (ref <5)

## 2021-01-08 LAB — RESP PANEL BY RT-PCR (FLU A&B, COVID) ARPGX2
Influenza A by PCR: NEGATIVE
Influenza B by PCR: NEGATIVE
SARS Coronavirus 2 by RT PCR: NEGATIVE

## 2021-01-08 LAB — URINALYSIS, ROUTINE W REFLEX MICROSCOPIC
Bilirubin Urine: NEGATIVE
Glucose, UA: NEGATIVE mg/dL
Ketones, ur: 80 mg/dL — AB
Leukocytes,Ua: NEGATIVE
Nitrite: NEGATIVE
Protein, ur: 30 mg/dL — AB
Specific Gravity, Urine: 1.028 (ref 1.005–1.030)
pH: 6 (ref 5.0–8.0)

## 2021-01-08 LAB — SALICYLATE LEVEL: Salicylate Lvl: <7 mg/dL — ABNORMAL LOW (ref 7.0–30.0)

## 2021-01-08 LAB — ACETAMINOPHEN LEVEL: Acetaminophen (Tylenol), Serum: <10 ug/mL — ABNORMAL LOW (ref 10–30)

## 2021-01-08 LAB — ETHANOL: Alcohol, Ethyl (B): <10 mg/dL (ref <10)

## 2021-01-08 MED ORDER — ONDANSETRON HCL 4 MG PO TABS
4.0000 mg | ORAL_TABLET | Freq: Three times a day (TID) | ORAL | Status: DC | PRN
  Administered 2021-01-08: 4 mg via ORAL
  Filled 2021-01-08: qty 1

## 2021-01-08 MED ORDER — KETOROLAC TROMETHAMINE 15 MG/ML IJ SOLN
15.0000 mg | Freq: Once | INTRAMUSCULAR | Status: AC
  Administered 2021-01-08: 15 mg via INTRAMUSCULAR
  Filled 2021-01-08: qty 1

## 2021-01-08 MED ORDER — HYDROXYZINE HCL 25 MG PO TABS
25.0000 mg | ORAL_TABLET | Freq: Once | ORAL | Status: AC
  Administered 2021-01-08: 25 mg via ORAL
  Filled 2021-01-08: qty 1

## 2021-01-08 MED ORDER — LORAZEPAM 1 MG PO TABS
1.0000 mg | ORAL_TABLET | Freq: Once | ORAL | Status: AC
  Administered 2021-01-08: 1 mg via ORAL
  Filled 2021-01-08: qty 1

## 2021-01-08 MED ORDER — ACETAMINOPHEN 325 MG PO TABS
650.0000 mg | ORAL_TABLET | Freq: Four times a day (QID) | ORAL | Status: DC | PRN
  Administered 2021-01-08 – 2021-01-10 (×5): 650 mg via ORAL
  Filled 2021-01-08 (×5): qty 2

## 2021-01-08 NOTE — ED Triage Notes (Addendum)
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 Last use this morning 3am. Endorses SI, no plan.

## 2021-01-08 NOTE — ED Notes (Signed)
Patient states she feels light headed and has a headache.

## 2021-01-08 NOTE — ED Notes (Signed)
Pt has clothing items in a bag behind the nurses station. Pt had with her a pair of soft boots, shorts, shirt, and a black zip up sweater.

## 2021-01-08 NOTE — ED Notes (Signed)
PA is at the bedside for assessment

## 2021-01-08 NOTE — ED Notes (Signed)
Patient speaking with TTS.  

## 2021-01-08 NOTE — ED Notes (Signed)
Ambulatory to [facility]

## 2021-01-08 NOTE — ED Notes (Signed)
Sitter at bedside.
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
## 2021-01-09 MED ORDER — KETOROLAC TROMETHAMINE 30 MG/ML IJ SOLN: 30.0000 mg | Freq: Once | INTRAMUSCULAR | Status: DC

## 2021-01-09 MED ORDER — LOPERAMIDE HCL 2 MG PO CAPS: 2.0000 mg | ORAL_CAPSULE | ORAL | Status: DC | PRN

## 2021-01-09 MED ORDER — CLONIDINE HCL 0.1 MG PO TABS: 0.1000 mg | ORAL_TABLET | ORAL | Status: DC

## 2021-01-09 MED ORDER — KETOROLAC TROMETHAMINE 60 MG/2ML IM SOLN
30.0000 mg | Freq: Once | INTRAMUSCULAR | Status: AC
  Administered 2021-01-09: 30 mg via INTRAMUSCULAR
  Filled 2021-01-09: qty 2

## 2021-01-09 MED ORDER — HYDROXYZINE HCL 25 MG PO TABS
25.0000 mg | ORAL_TABLET | Freq: Four times a day (QID) | ORAL | Status: DC | PRN
  Administered 2021-01-10 (×2): 25 mg via ORAL
  Filled 2021-01-09 (×3): qty 1

## 2021-01-09 MED ORDER — DICYCLOMINE HCL 20 MG PO TABS: 20.0000 mg | ORAL_TABLET | Freq: Four times a day (QID) | ORAL | Status: DC | PRN

## 2021-01-09 MED ORDER — HYDROXYZINE HCL 10 MG PO TABS
10.0000 mg | ORAL_TABLET | Freq: Once | ORAL | Status: AC
  Administered 2021-01-09: 10 mg via ORAL
  Filled 2021-01-09: qty 1

## 2021-01-09 MED ORDER — NAPROXEN 500 MG PO TABS
500.0000 mg | ORAL_TABLET | Freq: Two times a day (BID) | ORAL | Status: DC | PRN
  Administered 2021-01-09: 500 mg via ORAL
  Filled 2021-01-09: qty 1

## 2021-01-09 MED ORDER — CLONIDINE HCL 0.1 MG PO TABS: 0.1000 mg | ORAL_TABLET | Freq: Four times a day (QID) | ORAL | Status: DC

## 2021-01-09 MED ORDER — NAPROXEN 500 MG PO TABS
500.0000 mg | ORAL_TABLET | Freq: Two times a day (BID) | ORAL | Status: DC | PRN
  Administered 2021-01-10: 500 mg via ORAL
  Filled 2021-01-09: qty 1

## 2021-01-09 MED ORDER — IBUPROFEN 200 MG PO TABS
600.0000 mg | ORAL_TABLET | Freq: Once | ORAL | Status: AC
  Administered 2021-01-09: 600 mg via ORAL
  Filled 2021-01-09: qty 3

## 2021-01-09 MED ORDER — CLONIDINE HCL 0.1 MG PO TABS: 0.1000 mg | ORAL_TABLET | Freq: Every day | ORAL | Status: DC

## 2021-01-09 MED ORDER — METHOCARBAMOL 500 MG PO TABS
500.0000 mg | ORAL_TABLET | Freq: Three times a day (TID) | ORAL | Status: DC | PRN
  Administered 2021-01-09: 500 mg via ORAL
  Filled 2021-01-09: qty 1

## 2021-01-09 MED ORDER — CLONIDINE HCL 0.1 MG PO TABS
0.1000 mg | ORAL_TABLET | Freq: Four times a day (QID) | ORAL | Status: DC
  Administered 2021-01-09 – 2021-01-10 (×4): 0.1 mg via ORAL
  Filled 2021-01-09 (×4): qty 1

## 2021-01-09 MED ORDER — ONDANSETRON 4 MG PO TBDP: 4.0000 mg | ORAL_TABLET | Freq: Four times a day (QID) | ORAL | Status: DC | PRN

## 2021-01-09 MED ORDER — HYDROXYZINE HCL 25 MG PO TABS
25.0000 mg | ORAL_TABLET | Freq: Four times a day (QID) | ORAL | Status: DC | PRN
  Administered 2021-01-09: 25 mg via ORAL
  Filled 2021-01-09: qty 1

## 2021-01-09 MED ORDER — METHOCARBAMOL 500 MG PO TABS
500.0000 mg | ORAL_TABLET | Freq: Three times a day (TID) | ORAL | Status: DC | PRN
  Administered 2021-01-09 – 2021-01-10 (×3): 500 mg via ORAL
  Filled 2021-01-09 (×3): qty 1

## 2021-01-09 NOTE — ED Notes (Signed)
Made DR Rodena Medin aware of pt requesting medication for her anxiety. Awaiting new orders.

## 2021-01-09 NOTE — ED Notes (Addendum)
Pt given more heat packs to help with her pain and warm blankets

## 2021-01-09 NOTE — BH Assessment (Addendum)
Re-Assessment 01/09/21:   Patient re-assessed on this day by Clinician via tele assessment.   Kelly Hensley is a 25 year old patient who came to [facility] due to experiencing SI due to w/d from fentanyl and heroin. Patient stating that she went to [facility] for detox. However, because the facility doesn't take Medicaid she was referred to the Emergency Department for further assistance.. Patient also experiencing a lot of guilt. States, "I feel like I am the blame for loosing my home for myself and my children". Pt shares she had been living in a motel with her two children (3 and 4) and the children's father. Her children are safe and currently at her mothers' home. Her siblings are supportive and will watch her children while she is getting help.   Upon arrival on 01/08/2021 patient acknowledged that she was experiencing passive SI with a plan to o/d when she's going through withdrawals. However, today she denies suicidal ideations. She denies a suicide plan and/intent. She denies a history of suicide attempts and gestures. She reports a history of severe anxiety stating that her withdrawals symptoms are worsening her symptoms. No prior history of inpatient psychiatric treatment. Pt denies HI, AVH, NSSIB, access to guns/weapons, or engagement with the legal system. Pt shares she does not currently have a therapist or a psychiatrist.  Pt shares she typically uses heroin/fentanyl. She started using at the age of 25 yrs old and has used daily for 2 years at approximately 5-6 times/week and that she uses $60+ with each use. Last use was 3am on 01/07/2021. Current withdrawal symptoms include pain in her knees and middle of back; "My body heavy"; diarrhea, and hot/cold flashes. Patient's UDS is + for Benzo's, Amphetamine, and Opiates.  Patient states that her motivation to change is for the benefit of her #2 children (4 and 40 y/o).   Pt is oriented x5. Her recent/remote memory is intact. Pt was cooperative, though expressed  not feeling well, throughout the assessment process. Pt's insight, judgement, and impulse control is impaired at this time.  Patient asked how does she feel we could best help her today. She states, "I want to go somewhere that will help me and I want to feel better", "I didn't want to change my mind so I came here for help", "Please help me".   Disposition: Per Maxie Barb, NP, patient meets criteria for overnight observation. [facility]10/2021. Phone referral was completed with [facility] staff Loraine Leriche).  He stated that [facility] should have plenty of beds available in the morning.  CSW/Disposition Counselor will need to follow up with [facility] 01/10/2021 at 9am, per [facility]. Patient' nurse Lyla Son, RN) and EDP (Dr. Rodena Medin) provided disposition updates.

## 2021-01-09 NOTE — ED Notes (Signed)
Patient's mother called. Provided mother with update per patient's request.

## 2021-01-09 NOTE — ED Notes (Signed)
Per Nehemiah Settle NP, pt can possible get into [facility] tomorrow (4/25) because supposed to be having some available in the morning per Loraine Leriche at [facility]. CSW/Dispo Counselor will need to follow up with [facility] at 9am on 4/25 for bed availability.

## 2021-01-09 NOTE — ED Notes (Signed)
Pt given heat packs for back and hip pain.

## 2021-01-09 NOTE — ED Notes (Signed)
Pt telepsych assessment in process at this time

## 2021-01-09 NOTE — ED Notes (Signed)
Pt mother called, patient talking to her on phone

## 2021-01-09 NOTE — ED Notes (Signed)
Patient awake and alert. Reports feeling anxious. Will administer clonidine at this time. Ambulatory to restroom without difficulty.

## 2021-01-09 NOTE — ED Notes (Signed)
Pt doesn't like what was brought up on her lunch tray. Pt asking for a sandwich. Pt given ham sandwich.

## 2021-01-09 NOTE — ED Notes (Addendum)
Patient resting at this time. Will administer Toradol upon waking.

## 2021-01-09 NOTE — ED Notes (Signed)
Patient complains of constant pain in her back. Medication was given. Patient appears more restless and agitated, tossing and turning in bed. Patient states she can't sleep. Patient is moaning and groaning.

## 2021-01-09 NOTE — ED Notes (Signed)
Patient is now awake and states she feels hot and cold. She also states she feels like she can't eat anything and has not eaten anything in several days. I offered the patient gingerale and saltine crackers. Patient seems to be tolerating them well.

## 2021-01-09 NOTE — ED Notes (Signed)
Pt using phone to call her mom and her sister.

## 2021-01-09 NOTE — ED Provider Notes (Signed)
Emergency Medicine Observation Re-evaluation Note  Kelly Hensley is a 25 y.o. female, seen on rounds today.  Pt initially presented to the ED for complaints of Drug Problem and Suicidal Currently, the patient is sleeping comfortably and in NAD.  Physical Exam  BP 123/60 (BP Location: Left Arm)   Pulse (!) 50   Temp 99.3 F (37.4 C) (Oral)   Resp 16   SpO2 99%  Physical Exam General: Sleeping comfortably   ED Course / MDM  EKG:EKG Interpretation  Date/Time:  Saturday January 08 2021 16:27:59 EDT Ventricular Rate:  63 PR Interval:  142 QRS Duration: 88 QT Interval:  432 QTC Calculation: 442 R Axis:   81 Text Interpretation: Normal sinus rhythm with sinus arrhythmia Normal ECG When compared with ECG of 08/28/2012, No significant change was found Confirmed by Dione Booze ([id number]) on 01/08/2021 11:04:12 PM   I have reviewed the labs performed to date as well as medications administered while in observation.  Recent changes in the last 24 hours include none reported by staff.  Plan  Current plan is for placement secondary to SI/Withdrawal. Patient is not under full IVC at this time.   Wynetta Fines, MD 01/09/21 [phone]

## 2021-01-09 NOTE — ED Notes (Signed)
Pt taken by ED tech to TCU to use shower.

## 2021-01-09 NOTE — Consult Note (Addendum)
  Kelly Hensley is a 25 year old female who presented to [facility] 01/08/21 for opioid withdrawal and some suicidal ideations. Patient reported recent 2 year addiction to heroin/fentanyl via intranasal with last use Friday 0300. Per chart review, patient currently denies any active suicidal ideations, past suicidal attempts, or any psychiatric history; referred to [facility] for further detox and management where patient is likely to be accepted in the morning. Current Opioid withdrawal protocol in place for active withdrawal symptom management; clonidine initially held due to HR, restarted.  Patient was reviewed with attending MD [facility] who recommends following up with [facility] placement.

## 2021-01-09 NOTE — ED Notes (Signed)
Pt asking to use phone. Informed patient that per policy phone calls are allowed starting at 10am.  Pt states that she is withdrawing and very anxious and has two small children and needs to speak with someone. Informed pt again that she will be allowed to use phone at 10am. Pt states that she is very anxious and needs something.  Will speak with [facility] EDP about getting medication as nothing PRN is ordered for those symptoms at this time.

## 2021-01-09 NOTE — ED Notes (Signed)
Patient resting comfortably with eyes closed. Equal chest rise and fall noted. Will administer scheduled medications upon patient waking.

## 2021-01-09 NOTE — ED Notes (Addendum)
Patient continues to complain of back and leg pain. Provided with PRN tylenol and methocarbamol. Patient continues to request additional pain medication. MD made aware.

## 2021-01-09 NOTE — ED Notes (Signed)
Pt given more warm blankets and hot packs to help try to help with pain and be able to relax and get some sleep.

## 2021-01-09 NOTE — ED Notes (Signed)
Patient ambulated to the bathroom with sitter.

## 2021-01-09 NOTE — ED Notes (Signed)
Patient's sister provided with update per patient request.

## 2021-01-09 NOTE — ED Notes (Signed)
This RN went with pt to use restroom

## 2021-01-09 NOTE — ED Notes (Signed)
Patient provided with heat packs for back pain and warm blankets.

## 2021-01-09 NOTE — ED Notes (Signed)
Patient ambulatory to restroom without difficulty. 

## 2021-01-09 NOTE — BH Assessment (Signed)
Sent secure chat, requested nursing Lyla Son, RN) to place TTS machine in patient's room for TTS re-assessment.

## 2021-01-10 ENCOUNTER — Encounter (HOSPITAL_COMMUNITY): Payer: Self-pay | Admitting: Psychiatry

## 2021-01-10 ENCOUNTER — Other Ambulatory Visit: Payer: Self-pay

## 2021-01-10 ENCOUNTER — Inpatient Hospital Stay (HOSPITAL_COMMUNITY)
Admission: AD | Admit: 2021-01-10 | Discharge: 2021-01-13 | DRG: 897 | Disposition: A | Discharge status: Home / Self Care | Payer: Medicaid Other | Source: Intra-hospital | Attending: Psychiatry | Admitting: Psychiatry

## 2021-01-10 DIAGNOSIS — R45851 Suicidal ideations: Secondary | ICD-10-CM

## 2021-01-10 DIAGNOSIS — F1120 Opioid dependence, uncomplicated: Principal | ICD-10-CM | POA: Diagnosis present

## 2021-01-10 DIAGNOSIS — D649 Anemia, unspecified: Secondary | ICD-10-CM | POA: Diagnosis present

## 2021-01-10 DIAGNOSIS — G4700 Insomnia, unspecified: Secondary | ICD-10-CM | POA: Diagnosis present

## 2021-01-10 DIAGNOSIS — F419 Anxiety disorder, unspecified: Secondary | ICD-10-CM | POA: Diagnosis present

## 2021-01-10 DIAGNOSIS — F1124 Opioid dependence with opioid-induced mood disorder: Secondary | ICD-10-CM | POA: Diagnosis not present

## 2021-01-10 DIAGNOSIS — F17213 Nicotine dependence, cigarettes, with withdrawal: Secondary | ICD-10-CM | POA: Diagnosis present

## 2021-01-10 DIAGNOSIS — F1123 Opioid dependence with withdrawal: Secondary | ICD-10-CM | POA: Diagnosis not present

## 2021-01-10 HISTORY — DX: Depression, unspecified: F32.A

## 2021-01-10 HISTORY — DX: Suicidal ideations: R45.851

## 2021-01-10 HISTORY — DX: Anxiety disorder, unspecified: F41.9

## 2021-01-10 MED ORDER — HYDROXYZINE HCL 25 MG PO TABS
25.0000 mg | ORAL_TABLET | Freq: Four times a day (QID) | ORAL | Status: DC | PRN
  Administered 2021-01-10 – 2021-01-11 (×2): 25 mg via ORAL
  Filled 2021-01-10 (×3): qty 1

## 2021-01-10 MED ORDER — THIAMINE HCL 100 MG PO TABS
100.0000 mg | ORAL_TABLET | Freq: Every day | ORAL | Status: DC
  Administered 2021-01-10 – 2021-01-13 (×4): 100 mg via ORAL
  Filled 2021-01-10 (×6): qty 1

## 2021-01-10 MED ORDER — LORAZEPAM 1 MG PO TABS: 1.0000 mg | ORAL_TABLET | Freq: Four times a day (QID) | ORAL | Status: DC | PRN

## 2021-01-10 MED ORDER — METHOCARBAMOL 500 MG PO TABS
500.0000 mg | ORAL_TABLET | Freq: Three times a day (TID) | ORAL | Status: DC | PRN
  Administered 2021-01-11 – 2021-01-12 (×3): 500 mg via ORAL
  Filled 2021-01-10 (×3): qty 1

## 2021-01-10 MED ORDER — MAGNESIUM HYDROXIDE 400 MG/5ML PO SUSP: 30.0000 mL | Freq: Every day | ORAL | Status: DC | PRN

## 2021-01-10 MED ORDER — LOPERAMIDE HCL 2 MG PO CAPS: 2.0000 mg | ORAL_CAPSULE | ORAL | Status: DC | PRN

## 2021-01-10 MED ORDER — DICYCLOMINE HCL 20 MG PO TABS: 20.0000 mg | ORAL_TABLET | Freq: Four times a day (QID) | ORAL | Status: DC | PRN

## 2021-01-10 MED ORDER — SUMATRIPTAN SUCCINATE 50 MG PO TABS: 50.0000 mg | ORAL_TABLET | ORAL | Status: DC | PRN

## 2021-01-10 MED ORDER — HYDROXYZINE HCL 25 MG PO TABS: 25.0000 mg | ORAL_TABLET | Freq: Three times a day (TID) | ORAL | Status: DC | PRN

## 2021-01-10 MED ORDER — NAPROXEN 500 MG PO TABS
500.0000 mg | ORAL_TABLET | Freq: Two times a day (BID) | ORAL | Status: DC | PRN
  Administered 2021-01-11 (×2): 500 mg via ORAL
  Filled 2021-01-10 (×3): qty 1

## 2021-01-10 MED ORDER — ACETAMINOPHEN 325 MG PO TABS
650.0000 mg | ORAL_TABLET | Freq: Four times a day (QID) | ORAL | Status: DC | PRN
  Administered 2021-01-10 – 2021-01-11 (×2): 650 mg via ORAL
  Filled 2021-01-10 (×2): qty 2

## 2021-01-10 MED ORDER — ZIPRASIDONE MESYLATE 20 MG IM SOLR: 20.0000 mg | Freq: Four times a day (QID) | INTRAMUSCULAR | Status: DC | PRN

## 2021-01-10 MED ORDER — ALUM & MAG HYDROXIDE-SIMETH 200-200-20 MG/5ML PO SUSP: 30.0000 mL | ORAL | Status: DC | PRN

## 2021-01-10 MED ORDER — TRAZODONE HCL 50 MG PO TABS
50.0000 mg | ORAL_TABLET | Freq: Every evening | ORAL | Status: DC | PRN
  Administered 2021-01-11 (×2): 50 mg via ORAL
  Filled 2021-01-10 (×3): qty 1

## 2021-01-10 MED ORDER — FOLIC ACID 1 MG PO TABS
1.0000 mg | ORAL_TABLET | Freq: Every day | ORAL | Status: DC
  Administered 2021-01-10 – 2021-01-13 (×4): 1 mg via ORAL
  Filled 2021-01-10 (×7): qty 1

## 2021-01-10 MED ORDER — OLANZAPINE 5 MG PO TBDP
5.0000 mg | ORAL_TABLET | Freq: Three times a day (TID) | ORAL | Status: DC | PRN
  Administered 2021-01-10 – 2021-01-12 (×3): 5 mg via ORAL
  Filled 2021-01-10 (×3): qty 1

## 2021-01-10 MED ORDER — ONDANSETRON 4 MG PO TBDP: 4.0000 mg | ORAL_TABLET | Freq: Four times a day (QID) | ORAL | Status: DC | PRN
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
## 2021-01-10 NOTE — Tx Team (Signed)
Initial Treatment Plan 01/10/2021 7:56 PM Kelly Hensley [id number]    PATIENT STRESSORS: Educational concerns Financial difficulties Medication change or noncompliance Substance abuse   PATIENT STRENGTHS: Ability for insight Average or above average intelligence Capable of independent living Communication skills Motivation for treatment/growth Supportive family/friends   PATIENT IDENTIFIED PROBLEMS: "anxiety"  "depression"  "substance abuse"  "family problems"               DISCHARGE CRITERIA:  Ability to meet basic life and health needs Adequate post-discharge living arrangements Improved stabilization in mood, thinking, and/or behavior Medical problems require only outpatient monitoring Motivation to continue treatment in a less acute level of care Need for constant or close observation no longer present Reduction of life-threatening or endangering symptoms to within safe limits Safe-care adequate arrangements made Verbal commitment to aftercare and medication compliance Withdrawal symptoms are absent or subacute and managed without 24-hour nursing intervention  PRELIMINARY DISCHARGE PLAN: Attend aftercare/continuing care group Attend PHP/IOP Attend 12-step recovery group Outpatient therapy Return to previous work or school arrangements  PATIENT/FAMILY INVOLVEMENT: This treatment plan has been presented to and reviewed with the patient, Kelly Hensley..  The patient and family have been given the opportunity to ask questions and make suggestions.  Kelly Hensley, [place] 01/10/2021, 7:56 PM

## 2021-01-10 NOTE — Plan of Care (Signed)
Nurse discussed coping skills with patient.  

## 2021-01-10 NOTE — ED Notes (Signed)
Transport called.

## 2021-01-10 NOTE — Progress Notes (Signed)
Psychoeducational Group Note  Date:  01/10/2021 Time:  2225  Group Topic/Focus:  Wrap-Up Group:   The focus of this group is to help patients review their daily goal of treatment and discuss progress on daily workbooks.  Participation Level: Did Not Attend  Participation Quality:  Not Applicable  Affect:  Not Applicable  Cognitive:  Not Applicable  Insight:  Not Applicable  Engagement in Group: Not Applicable  Additional Comments:  The patient did not attend group this evening.   Hazle Coca S 01/10/2021, 10:25 PM

## 2021-01-10 NOTE — ED Notes (Signed)
Attempted report to [facility]. Informed accepting nurse was doing 2 discharges. Left call back information.

## 2021-01-10 NOTE — BH Assessment (Signed)
[facility] Assessment Progress Note  Per Vernard Gambles, NP, this pt requires psychiatric hospitalization at this time.  Malva Limes, RN, [facility] has assigned pt to [facility] Rm 304-2 to the service of Landry Mellow MD; [facility] will be ready to receive pt at 14:00.  Pt has signed Voluntary Admission and Consent for Treatment, as well as Consent to Release Information to pt's mother and sister, and signed forms have been faxed to [facility].  EDP Gwyneth Sprout, MD and pt's nurse, I-Li, have been notified, and I-Li agrees to send original paperwork along with pt via [employer], and to call report to [phone].  Doylene Canning, [place] Behavioral Health Coordinator [phone]

## 2021-01-10 NOTE — ED Notes (Signed)
Pt provided with meal tray. Ambulated to restroom without difficulty. 

## 2021-01-10 NOTE — ED Notes (Signed)
Pt provided with meal tray. Ambulated to restroom without difficulty.

## 2021-01-10 NOTE — Consult Note (Addendum)
[facility] Face-to-Face Psychiatry Consult   Reason for Consult:  Psychiatric evaluation for SI Referring Physician:  Dr. Anitra Lauth MD  Patient Identification: Kelly Hensley MRN:  [id number] Principal Diagnosis: Suicidal ideations Diagnosis:  Principal Problem:   Suicidal ideations   Total Time spent with patient: 20 minutes  Subjective:   Kelly Hensley is a 25 y.o. 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   Kelly Hensley, 26 y.o., female patient seen face to face by this provider in the [facility], consulted with Dr. Lucianne Muss; and chart reviewed on 01/10/21.  On evaluation Kelly Hensley reports, "I am so sick, I am in withdrawal, I need help"  HPI  During evaluation Kelly Hensley is laying in bed in no acute distress. She is disheveled, has fleeting eye contact, and her voice is muffled at times. She is alert, oriented x 4, anxious but cooperative. Her mood is depressed with congruent affect. States she is in pain all over, She does not appear to be responding to internal/external stimuli or delusional thoughts.  Patient denies homicidal ideation, psychosis, and paranoia.Reports she feels hopeless. Patient endorses suicidal ideation. Would not elaborate if there was a paln in place, but stated, "I could do it, this isn't getting better and I just don't know what I will do". Patient would not contract for safety. Repots she is impulsive and states, "I am not in the state of mind to keep myself safe, the pain and withdrawal is too much for me".  Reports regular use of heroin and fentanyl, she states she uses "60-80" per day. Reports she is homeless. She has two children that resides with her mother. Reports she is currently homeless due to her drug addiction.   Past Psychiatric History: unkwon  Risk to Self:  yes Risk to Others:  patient denies Prior Inpatient Therapy:  unknown Prior Outpatient Therapy:   yes  Past Medical History:  Past Medical  History:  Diagnosis Date  . Anemia   . Cellulitis and abscess of trunk   . 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 Migraines   . Morbid obesity (HCC)   . Sickle cell trait [facility])     Past Surgical History:  Procedure Laterality Date  . ABCESS DRAINAGE Left 2014   Family History:  Family History  Problem Relation Age of Onset  . Diabetes Mother   . Sickle cell anemia Father    Family Psychiatric  History: unkown Social History:  Social History   Substance and Sexual Activity  Alcohol Use No     Social History   Substance and Sexual Activity  Drug Use No   Comment: denies but smell marijuana    Social History   Socioeconomic History  . Marital status: Single    Spouse name: Not on file  . Number of children: Not on file  . Years of education: Not on file  . Highest education level: Not on file  Occupational History  . Not on file  Tobacco Use  . Smoking status: Former Smoker    Packs/day: 0.25    Types: Cigarettes    Quit date: 02/17/2015    Years since quitting: 5.9  . Smokeless tobacco: Never Used  Substance and Sexual Activity  . Alcohol use: No  . Drug use: No    Comment: denies but smell marijuana  . Sexual activity: Yes    Birth control/protection: Implant  Other Topics  Concern  . Not on file  Social History Narrative  . Not on file   Social Determinants of Health   Financial Resource Strain: Not on file  Food Insecurity: Not on file  Transportation Needs: Not on file  Physical Activity: Not on file  Stress: Not on file  Social Connections: Not on file   Additional Social History:    Allergies:  No Known Allergies  Labs:  Results for orders placed or performed during the hospital encounter of 01/08/21 (from the past 48 hour(s))  Comprehensive metabolic panel     Status: Abnormal   Collection Time: 01/08/21  4:09 PM  Result Value Ref Range   Sodium 136 135 - 145 mmol/L   Potassium 4.1 3.5 - 5.1 mmol/L   Chloride 106  98 - 111 mmol/L   CO2 21 (L) 22 - 32 mmol/L   Glucose, Bld 86 70 - 99 mg/dL    Comment: Glucose reference range applies only to samples taken after fasting for at least 8 hours.   BUN 8 6 - 20 mg/dL   Creatinine, Ser 1.020.78 0.44 - 1.00 mg/dL   Calcium 8.9 8.9 - 72.510.3 mg/dL   Total Protein 6.7 6.5 - 8.1 g/dL   Albumin 3.8 3.5 - 5.0 g/dL   AST 9 (L) 15 - 41 U/L   ALT 9 0 - 44 U/L   Alkaline Phosphatase 54 38 - 126 U/L   Total Bilirubin 0.4 0.3 - 1.2 mg/dL   GFR, Estimated >36>60 >64>60 mL/min    Comment: (NOTE) Calculated using the CKD-EPI Creatinine Equation (2021)    Anion gap 9 5 - 15    Comment: Performed at [facility], [address] [place], [place] [address]  Ethanol     Status: None   Collection Time: 01/08/21  4:09 PM  Result Value Ref Range   Alcohol, Ethyl (B) <10 <10 mg/dL    Comment: (NOTE) Lowest detectable limit for serum alcohol is 10 mg/dL.  For medical purposes only. Performed at [facility], [address] [place], [place] [address]   Urine rapid drug screen (hosp performed)     Status: Abnormal   Collection Time: 01/08/21  4:09 PM  Result Value Ref Range   Opiates POSITIVE (A) NONE DETECTED   Cocaine NONE DETECTED NONE DETECTED   Benzodiazepines POSITIVE (A) NONE DETECTED   Amphetamines POSITIVE (A) NONE DETECTED   Tetrahydrocannabinol NONE DETECTED NONE DETECTED   Barbiturates NONE DETECTED NONE DETECTED    Comment: (NOTE) DRUG SCREEN FOR MEDICAL PURPOSES ONLY.  IF CONFIRMATION IS NEEDED FOR ANY PURPOSE, NOTIFY LAB WITHIN 5 DAYS.  LOWEST DETECTABLE LIMITS FOR URINE DRUG SCREEN Drug Class                     Cutoff (ng/mL) Amphetamine and metabolites    1000 Barbiturate and metabolites    200 Benzodiazepine                 200 Tricyclics and metabolites     300 Opiates and metabolites        300 Cocaine and metabolites        300 THC                            50 Performed at [facility],  [address] [place], [place] [address]   CBC with Diff     Status: Abnormal  Collection Time: 01/08/21  4:09 PM  Result Value Ref Range   WBC 7.2 4.0 - 10.5 K/uL   RBC 4.43 3.87 - 5.11 MIL/uL   Hemoglobin 11.7 (L) 12.0 - 15.0 g/dL   HCT 56.3 (L) 87.5 - 64.3 %   MCV 77.0 (L) 80.0 - 100.0 fL   MCH 26.4 26.0 - 34.0 pg   MCHC 34.3 30.0 - 36.0 g/dL   RDW 32.9 51.8 - 84.1 %   Platelets 307 150 - 400 K/uL   nRBC 0.0 0.0 - 0.2 %   Neutrophils Relative % 70 %   Neutro Abs 5.0 1.7 - 7.7 K/uL   Lymphocytes Relative 23 %   Lymphs Abs 1.6 0.7 - 4.0 K/uL   Monocytes Relative 7 %   Monocytes Absolute 0.5 0.1 - 1.0 K/uL   Eosinophils Relative 0 %   Eosinophils Absolute 0.0 0.0 - 0.5 K/uL   Basophils Relative 0 %   Basophils Absolute 0.0 0.0 - 0.1 K/uL   Immature Granulocytes 0 %   Abs Immature Granulocytes 0.02 0.00 - 0.07 K/uL    Comment: Performed at [facility], [address] [place], [place] [address]  Salicylate level     Status: Abnormal   Collection Time: 01/08/21  4:09 PM  Result Value Ref Range   Salicylate Lvl <7.0 (L) 7.0 - 30.0 mg/dL    Comment: Performed at [facility], [address], [place], [place] [address]  Acetaminophen level     Status: Abnormal   Collection Time: 01/08/21  4:09 PM  Result Value Ref Range   Acetaminophen (Tylenol), Serum <10 (L) 10 - 30 ug/mL    Comment: (NOTE) Therapeutic concentrations vary significantly. A range of 10-30 ug/mL  may be an effective concentration for many patients. However, some  are best treated at concentrations outside of this range. Acetaminophen concentrations >150 ug/mL at 4 hours after ingestion  and >50 ug/mL at 12 hours after ingestion are often associated with  toxic reactions.  Performed at [facility], [address], [place], [place] [address]   Urinalysis, Routine w reflex microscopic Urine, Clean Catch     Status: Abnormal   Collection Time:  01/08/21  4:10 PM  Result Value Ref Range   Color, Urine AMBER (A) YELLOW    Comment: BIOCHEMICALS MAY BE AFFECTED BY COLOR   APPearance CLEAR CLEAR   Specific Gravity, Urine 1.028 1.005 - 1.030   pH 6.0 5.0 - 8.0   Glucose, UA NEGATIVE NEGATIVE mg/dL   Hgb urine dipstick SMALL (A) NEGATIVE   Bilirubin Urine NEGATIVE NEGATIVE   Ketones, ur 80 (A) NEGATIVE mg/dL   Protein, ur 30 (A) NEGATIVE mg/dL   Nitrite NEGATIVE NEGATIVE   Leukocytes,Ua NEGATIVE NEGATIVE   RBC / HPF 0-5 0 - 5 RBC/hpf   WBC, UA 0-5 0 - 5 WBC/hpf   Bacteria, UA RARE (A) NONE SEEN   Squamous Epithelial / LPF 0-5 0 - 5   Mucus PRESENT     Comment: Performed at [facility] [place] [facility], [address], [place], [place] [address]  Resp Panel by RT-PCR (Flu A&B, Covid) Nasopharyngeal Swab     Status: None   Collection Time: 01/08/21  4:20 PM   Specimen: Nasopharyngeal Swab; Nasopharyngeal(NP) swabs in vial transport medium  Result Value Ref Range   SARS Coronavirus 2 by RT PCR NEGATIVE NEGATIVE    Comment: (NOTE) SARS-CoV-2 target nucleic acids are NOT DETECTED.  The SARS-CoV-2 RNA is  generally detectable in upper respiratory specimens during the acute phase of infection. The lowest concentration of SARS-CoV-2 viral copies this assay can detect is 138 copies/mL. A negative result does not preclude SARS-Cov-2 infection and should not be used as the sole basis for treatment or other patient management decisions. A negative result may occur with  improper specimen collection/handling, submission of specimen other than nasopharyngeal swab, presence of viral mutation(s) within the areas targeted by this assay, and inadequate number of viral copies(<138 copies/mL). A negative result must be combined with clinical observations, patient history, and epidemiological information. The expected result is Negative.  Fact Sheet for Patients:  BloggerCourse.com  Fact Sheet for Healthcare  Providers:  SeriousBroker.it  This test is no t yet approved or cleared by the [place] FDA and  has been authorized for detection and/or diagnosis of SARS-CoV-2 by FDA under an Emergency Use Authorization (EUA). This EUA will remain  in effect (meaning this test can be used) for the duration of the COVID-19 declaration under Section 564(b)(1) of the Act, 21 U.S.C.section 360bbb-3(b)(1), unless the authorization is terminated  or revoked sooner.       Influenza A by PCR NEGATIVE NEGATIVE   Influenza B by PCR NEGATIVE NEGATIVE    Comment: (NOTE) The Xpert Xpress SARS-CoV-2/FLU/RSV plus assay is intended as an aid in the diagnosis of influenza from Nasopharyngeal swab specimens and should not be used as a sole basis for treatment. Nasal washings and aspirates are unacceptable for Xpert Xpress SARS-CoV-2/FLU/RSV testing.  Fact Sheet for Patients: BloggerCourse.com  Fact Sheet for Healthcare Providers: SeriousBroker.it  This test is not yet approved or cleared by the [place] FDA and has been authorized for detection and/or diagnosis of SARS-CoV-2 by FDA under an Emergency Use Authorization (EUA). This EUA will remain in effect (meaning this test can be used) for the duration of the COVID-19 declaration under Section 564(b)(1) of the Act, 21 U.S.C. section 360bbb-3(b)(1), unless the authorization is terminated or revoked.  Performed at [facility], [address], [place], [place] [address]   I-Stat beta hCG blood, ED     Status: None   Collection Time: 01/08/21  4:28 PM  Result Value Ref Range   I-stat hCG, quantitative <5.0 <5 mIU/mL   Comment 3            Comment:   GEST. AGE      CONC.  (mIU/mL)   <=1 WEEK        5 - 50     2 WEEKS       50 - 500     3 WEEKS       100 - 10,000     4 WEEKS     1,000 - 30,000        FEMALE AND NON-PREGNANT FEMALE:     LESS THAN 5  mIU/mL     Current Facility-Administered Medications  Medication Dose Route Frequency Provider Last Rate Last Admin  . acetaminophen (TYLENOL) tablet 650 mg  650 mg Oral Q6H PRN Sponseller, Rebekah R, PA-C   650 mg at 01/10/21 0840  . cloNIDine (CATAPRES) tablet 0.1 mg  0.1 mg Oral QID Leevy-Johnson, Brooke A, NP   0.1 mg at 01/10/21 1105   Followed by  . [START ON 01/11/2021] cloNIDine (CATAPRES) tablet 0.1 mg  0.1 mg Oral BH-qamhs Leevy-Johnson, Brooke A, NP       Followed by  . [START ON 01/14/2021] cloNIDine (CATAPRES) tablet 0.1 mg  0.1  mg Oral QAC breakfast Leevy-Johnson, Brooke A, NP      . dicyclomine (BENTYL) tablet 20 mg  20 mg Oral Q6H PRN Leevy-Johnson, Brooke A, NP      . hydrOXYzine (ATARAX/VISTARIL) tablet 25 mg  25 mg Oral Q6H PRN Leevy-Johnson, Brooke A, NP   25 mg at 01/10/21 0840  . loperamide (IMODIUM) capsule 2-4 mg  2-4 mg Oral PRN Leevy-Johnson, Brooke A, NP      . methocarbamol (ROBAXIN) tablet 500 mg  500 mg Oral Q8H PRN Leevy-Johnson, Brooke A, NP   500 mg at 01/10/21 1105  . naproxen (NAPROSYN) tablet 500 mg  500 mg Oral BID PRN Leevy-Johnson, Brooke A, NP   500 mg at 01/10/21 1105  . ondansetron (ZOFRAN-ODT) disintegrating tablet 4 mg  4 mg Oral Q6H PRN Leevy-Johnson, Brooke A, NP       Current Outpatient Medications  Medication Sig Dispense Refill  . Etonogestrel (NEXPLANON [place]) Inject 1 each into the skin continuous.     Marland Kitchen VIENVA 0.1-20 MG-MCG tablet Take 1 tablet by mouth daily.    . metroNIDAZOLE (FLAGYL) 500 MG tablet Take 1 tablet (500 mg total) by mouth 2 (two) times daily. (Patient not taking: No sig reported) 14 tablet 0    Musculoskeletal: Strength & Muscle Tone: within normal limits Gait & Station: normal, unsteady Patient leans: Right and N/A   Psychiatric Specialty Exam:  Presentation  General Appearance: Disheveled  Eye Contact:Fleeting  Speech:Clear and Coherent; Normal Rate  Speech Volume:No data recorded Handedness:Right   Mood  and Affect  Mood:Anxious; Depressed  Affect:Congruent; Depressed   Thought Process  Thought Processes:Coherent; Goal Directed  Descriptions of Associations:Intact  Orientation:Full (Time, Place and Person)  Thought Content:Logical  History of Schizophrenia/Schizoaffective disorder:No  Duration of Psychotic Symptoms:No data recorded Hallucinations:Hallucinations: None  Ideas of Reference:None  Suicidal Thoughts:Suicidal Thoughts: Yes, Passive SI Passive Intent and/or Plan: With Intent; With Plan (When asked a plan she states when her pain is bad and withdrawel is bad enough, " i dont know what i will do".)  Homicidal Thoughts:Homicidal Thoughts: No   Sensorium  Memory:Immediate Good; Recent Good; Remote Good  Judgment:Poor  Insight:Fair   Executive Functions  Concentration:Fair  Attention Span:Fair  Recall:Good  Fund of Knowledge:Good  Language:Good   Psychomotor Activity  Psychomotor Activity:Psychomotor Activity: Normal   Assets  Assets:Communication Skills; Desire for Improvement; Physical Health; Resilience; Financial Resources/Insurance   Sleep  Sleep:Sleep: Poor Number of Hours of Sleep: 4   Physical Exam: Physical Exam Vitals reviewed.  Constitutional:      Appearance: Normal appearance.  HENT:     Head: Normocephalic.     Right Ear: Tympanic membrane normal.     Left Ear: Tympanic membrane normal.     Nose: Nose normal.     Mouth/Throat:     Pharynx: Oropharynx is clear.  Eyes:     Conjunctiva/sclera: Conjunctivae normal.  Cardiovascular:     Rate and Rhythm: Normal rate.     Pulses: Normal pulses.  Pulmonary:     Effort: Pulmonary effort is normal.  Abdominal:     Tenderness: There is no guarding.  Musculoskeletal:        General: Normal range of motion.     Cervical back: Normal range of motion.  Skin:    General: Skin is warm and dry.  Neurological:     Mental Status: She is alert and oriented to person, place, and  time.  Psychiatric:        Attention  and Perception: Attention normal.        Mood and Affect: Mood is anxious and depressed.        Speech: Speech normal.        Behavior: Behavior is not agitated, aggressive or combative. Behavior is cooperative.        Thought Content: Thought content includes suicidal ideation. Thought content includes suicidal plan.        Cognition and Memory: Cognition normal.        Judgment: Judgment is impulsive.    Review of Systems  Constitutional: Negative.   HENT: Negative.   Eyes: Negative.   Respiratory: Negative.   Cardiovascular: Negative.   Gastrointestinal: Negative.   Genitourinary: Negative.   Musculoskeletal: Negative.   Skin: Negative.   Neurological: Negative.   Endo/Heme/Allergies: Negative.   Psychiatric/Behavioral: Negative.    Blood pressure 118/65, pulse (!) 48, temperature 100 F (37.8 C), temperature source Oral, resp. rate 18, SpO2 100 %, unknown if currently breastfeeding. There is no height or weight on file to calculate BMI.   Treatment Plan/Recommendations: 1. Admit for crisis management and stabilization, estimated length of stay 3-5 days.  2. Medication management to reduce current symptoms to base line and improve the patient's overall level of functioning  3. Treat health problems as indicated.  4. Develop treatment plan to decrease risk of relapse upon discharge and the need for readmission.  5. Psycho-social education regarding relapse prevention and self care.  6. Health care follow up as needed for medical problems.  7. Review, reconcile, and reinstate any pertinent home medications for other health issues where appropriate. 8. Call for consults with hospitalist for any additional specialty patient care services as needed.  Disposition: Recommend psychiatric Inpatient admission when medically cleared.   Dr. Anitra Lauth EDP made aware of patients disposition.   Ardis Hughs, NP 01/10/2021 1:43 PM

## 2021-01-10 NOTE — ED Notes (Signed)
Provided pt with crackers and peanut butter. Stated "I just can't eat this food, I don't know if it's the food or the withdrawal." Also provided pt with heat pack and warm blanket for back.

## 2021-01-11 DIAGNOSIS — F1123 Opioid dependence with withdrawal: Secondary | ICD-10-CM

## 2021-01-11 LAB — URINALYSIS, COMPLETE (UACMP) WITH MICROSCOPIC
Bilirubin Urine: NEGATIVE
Glucose, UA: NEGATIVE mg/dL
Ketones, ur: 5 mg/dL — AB
Leukocytes,Ua: NEGATIVE
Nitrite: NEGATIVE
Protein, ur: NEGATIVE mg/dL
Specific Gravity, Urine: 1.01 (ref 1.005–1.030)
pH: 6 (ref 5.0–8.0)

## 2021-01-11 LAB — HEMOGLOBIN A1C
Hgb A1c MFr Bld: 4.5 % — ABNORMAL LOW (ref 4.8–5.6)
Mean Plasma Glucose: 82.45 mg/dL

## 2021-01-11 LAB — LIPID PANEL
Cholesterol: 139 mg/dL (ref 0–200)
HDL: 32 mg/dL — ABNORMAL LOW (ref >40)
LDL Cholesterol: 82 mg/dL (ref 0–99)
Total CHOL/HDL Ratio: 4.3 RATIO
Triglycerides: 124 mg/dL (ref <150)
VLDL: 25 mg/dL (ref 0–40)

## 2021-01-11 LAB — HEPATITIS PANEL, ACUTE
HCV Ab: NONREACTIVE
Hep A IgM: NONREACTIVE
Hep B C IgM: NONREACTIVE
Hepatitis B Surface Ag: NONREACTIVE

## 2021-01-11 LAB — TSH: TSH: 0.782 u[IU]/mL (ref 0.350–4.500)

## 2021-01-11 MED ORDER — LEVONORGESTREL-ETHINYL ESTRAD 0.1-20 MG-MCG PO TABS
1.0000 | ORAL_TABLET | Freq: Every day | ORAL | Status: DC
  Filled 2021-01-11 (×2): qty 1

## 2021-01-11 MED ORDER — LEVONORGESTREL-ETHINYL ESTRAD 0.1-20 MG-MCG PO TABS
1.0000 | ORAL_TABLET | Freq: Every day | ORAL | Status: DC
  Administered 2021-01-11 – 2021-01-13 (×3): 1 via ORAL

## 2021-01-11 NOTE — Progress Notes (Addendum)
Pt visible on unit some this evening Pt given PRN Naproxen and Trazodone per [facility]   01/11/21 2200  Psych Admission Type (Psych Patients Only)  Admission Status Voluntary  Psychosocial Assessment  Patient Complaints Anxiety;Depression  Eye Contact Fair  Facial Expression Anxious;Sullen;Sad;Worried  Affect Anxious;Depressed;Sad;Sullen  Speech Logical/coherent  Interaction Cautious;Childlike  Motor Activity Unsteady  Appearance/Hygiene Improved  Behavior Characteristics Cooperative  Mood Depressed  Thought Process  Coherency Concrete thinking  Content Obsessions  Delusions Somatic  Perception WDL  Hallucination None reported or observed  Judgment Poor  Confusion None  Danger to Self  Current suicidal ideation? Denies  Danger to Others  Danger to Others None reported or observed

## 2021-01-11 NOTE — Progress Notes (Signed)
Psychoeducational Group Note  Date:  01/11/2021 Time:  2047  Group Topic/Focus:  Wrap-Up Group:   The focus of this group is to help patients review their daily goal of treatment and discuss progress on daily workbooks.  Participation Level: Did Not Attend  Participation Quality:  Not Applicable  Affect:  Not Applicable  Cognitive:  Not Applicable  Insight:  Not Applicable  Engagement in Group: Not Applicable  Additional Comments:  Pt. Did not attend group this evening.   Hazle Coca S 01/11/2021, 8:46 PM
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
## 2021-01-11 NOTE — Progress Notes (Signed)
Progress note    01/11/21 0809  Psych Admission Type (Psych Patients Only)  Admission Status Voluntary  Psychosocial Assessment  Patient Complaints Anxiety;Depression;Sadness;Worrying  Eye Contact Fair  Facial Expression Anxious;Sullen;Sad;Worried  Affect Anxious;Depressed;Sad;Sullen  Speech Logical/coherent  Interaction Cautious;Childlike  Motor Activity Unsteady  Appearance/Hygiene Improved  Behavior Characteristics Cooperative;Anxious  Mood Depressed;Anxious;Despair;Sad;Sullen;Pleasant  Thought Administrator, sports thinking  Content Obsessions  Delusions Somatic  Perception WDL  Hallucination None reported or observed  Judgment Poor  Confusion None  Danger to Self  Current suicidal ideation? Denies  Danger to Others  Danger to Others None reported or observed

## 2021-01-11 NOTE — Progress Notes (Signed)
Pt was cooperative with treatment, compliant with medications, she remains sad and depressed on approach, she was logical in conversation and she denied any suicidal ideation or AV/AH. She is currently in bed resting quietly at this time. She uses wheelchair to help assist with ambulation.
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
## 2021-01-11 NOTE — Progress Notes (Cosign Needed)
Pt was meeting with provider therefore did not make it to group.

## 2021-01-11 NOTE — BHH Counselor (Signed)
CSW made a CPS report to [facility] CPS. Report was taken by Alycia Patten.  Fredirick Lathe, LCSWA Clinicial Social Worker [employer]

## 2021-01-11 NOTE — Plan of Care (Signed)
  Problem: Education: Goal: Ability to state activities that reduce stress will improve Outcome: Progressing   Problem: Education: Goal: Utilization of techniques to improve thought processes will improve Outcome: Progressing Goal: Knowledge of the prescribed therapeutic regimen will improve Outcome: Progressing   

## 2021-01-12 ENCOUNTER — Encounter (HOSPITAL_COMMUNITY): Payer: Self-pay | Admitting: Psychiatry

## 2021-01-12 LAB — RPR: RPR Ser Ql: NONREACTIVE

## 2021-01-12 MED ORDER — NICOTINE 14 MG/24HR TD PT24
14.0000 mg | MEDICATED_PATCH | Freq: Every day | TRANSDERMAL | Status: DC
  Administered 2021-01-12 – 2021-01-13 (×2): 14 mg via TRANSDERMAL
  Filled 2021-01-12 (×3): qty 1

## 2021-01-12 MED ORDER — WHITE PETROLATUM EX OINT
TOPICAL_OINTMENT | CUTANEOUS | Status: AC
  Filled 2021-01-12: qty 5

## 2021-01-12 MED ORDER — HYDROXYZINE HCL 25 MG PO TABS
25.0000 mg | ORAL_TABLET | Freq: Four times a day (QID) | ORAL | Status: DC | PRN
  Administered 2021-01-12 – 2021-01-13 (×2): 25 mg via ORAL
  Filled 2021-01-12 (×2): qty 1

## 2021-01-12 MED ORDER — TRAZODONE HCL 100 MG PO TABS
100.0000 mg | ORAL_TABLET | Freq: Every evening | ORAL | Status: DC | PRN
  Administered 2021-01-12: 100 mg via ORAL
  Filled 2021-01-12: qty 1

## 2021-01-12 NOTE — Tx Team (Signed)
Interdisciplinary Treatment and Diagnostic Plan Update  01/12/2021 Time of Session: 9:15am  Kelly Hensley MRN: [id number]  Principal Diagnosis: Opiate dependence (HCC)  Secondary Diagnoses: Principal Problem:   Opiate dependence (HCC)   Current Medications:  Current Facility-Administered Medications  Medication Dose Route Frequency Provider Last Rate Last Admin  . acetaminophen (TYLENOL) tablet 650 mg  650 mg Oral Q6H PRN Antonieta Pert, MD   650 mg at 01/11/21 0524  . alum & mag hydroxide-simeth (MAALOX/MYLANTA) 200-200-20 MG/5ML suspension 30 mL  30 mL Oral Q4H PRN Antonieta Pert, MD      . dicyclomine (BENTYL) tablet 20 mg  20 mg Oral Q6H PRN Antonieta Pert, MD      . folic acid (FOLVITE) tablet 1 mg  1 mg Oral Daily Antonieta Pert, MD   1 mg at 01/12/21 0751  . hydrOXYzine (ATARAX/VISTARIL) tablet 25 mg  25 mg Oral Q6H PRN Comer Locket, MD      . levonorgestrel-ethinyl estradiol (ALESSE) 0.1-20 MG-MCG per tablet 1 tablet  1 tablet Oral Daily Antonieta Pert, MD   1 tablet at 01/12/21 0751  . loperamide (IMODIUM) capsule 2-4 mg  2-4 mg Oral PRN Antonieta Pert, MD      . LORazepam (ATIVAN) tablet 1 mg  1 mg Oral Q6H PRN Antonieta Pert, MD      . magnesium hydroxide (MILK OF MAGNESIA) suspension 30 mL  30 mL Oral Daily PRN Antonieta Pert, MD      . methocarbamol (ROBAXIN) tablet 500 mg  500 mg Oral Q8H PRN Antonieta Pert, MD   500 mg at 01/12/21 0346  . naproxen (NAPROSYN) tablet 500 mg  500 mg Oral BID PRN Antonieta Pert, MD   500 mg at 01/11/21 2116  . nicotine (NICODERM CQ - dosed in mg/24 hours) patch 14 mg  14 mg Transdermal Daily Comer Locket, MD   14 mg at 01/12/21 0955  . ondansetron (ZOFRAN-ODT) disintegrating tablet 4 mg  4 mg Oral Q6H PRN Antonieta Pert, MD      . SUMAtriptan [facility]) tablet 50 mg  50 mg Oral Q2H PRN Antonieta Pert, MD      . thiamine tablet 100 mg  100 mg Oral Daily Antonieta Pert, MD   100 mg at  01/12/21 0751  . traZODone (DESYREL) tablet 100 mg  100 mg Oral QHS PRN Comer Locket, MD       PTA Medications: Medications Prior to Admission  Medication Sig Dispense Refill Last Dose  . Etonogestrel (NEXPLANON [place]) Inject 1 each into the skin continuous.      . metroNIDAZOLE (FLAGYL) 500 MG tablet Take 1 tablet (500 mg total) by mouth 2 (two) times daily. (Patient not taking: No sig reported) 14 tablet 0   . VIENVA 0.1-20 MG-MCG tablet Take 1 tablet by mouth daily.       Patient Stressors: Civil Service fast streamer difficulties Medication change or noncompliance Substance abuse  Patient Strengths: Ability for insight Average or above average intelligence Capable of independent living Barrister's clerk for treatment/growth Supportive family/friends  Treatment Modalities: Medication Management, Group therapy, Case management,  1 to 1 session with clinician, Psychoeducation, Recreational therapy.   Physician Treatment Plan for Primary Diagnosis: Opiate dependence (HCC) Long Term Goal(s): Improvement in symptoms so as ready for discharge   Short Term Goals: Ability to identify changes in lifestyle to reduce recurrence of condition will improve Ability to identify triggers associated  with substance abuse/mental health issues will improve  Medication Management: Evaluate patient's response, side effects, and tolerance of medication regimen.  Therapeutic Interventions: 1 to 1 sessions, Unit Group sessions and Medication administration.  Evaluation of Outcomes: Progressing  Physician Treatment Plan for Secondary Diagnosis: Principal Problem:   Opiate dependence (HCC)  Long Term Goal(s): Improvement in symptoms so as ready for discharge   Short Term Goals: Ability to identify changes in lifestyle to reduce recurrence of condition will improve Ability to identify triggers associated with substance abuse/mental health issues will improve     Medication  Management: Evaluate patient's response, side effects, and tolerance of medication regimen.  Therapeutic Interventions: 1 to 1 sessions, Unit Group sessions and Medication administration.  Evaluation of Outcomes: Progressing   RN Treatment Plan for Primary Diagnosis: Opiate dependence (HCC) Long Term Goal(s): Knowledge of disease and therapeutic regimen to maintain health will improve  Short Term Goals: Ability to remain free from injury will improve, Ability to demonstrate self-control, Ability to participate in decision making will improve, Ability to verbalize feelings will improve, Ability to disclose and discuss suicidal ideas and Ability to identify and develop effective coping behaviors will improve  Medication Management: RN will administer medications as ordered by provider, will assess and evaluate patient's response and provide education to patient for prescribed medication. RN will report any adverse and/or side effects to prescribing provider.  Therapeutic Interventions: 1 on 1 counseling sessions, Psychoeducation, Medication administration, Evaluate responses to treatment, Monitor vital signs and CBGs as ordered, Perform/monitor CIWA, COWS, AIMS and Fall Risk screenings as ordered, Perform wound care treatments as ordered.  Evaluation of Outcomes: Progressing   LCSW Treatment Plan for Primary Diagnosis: Opiate dependence (HCC) Long Term Goal(s): Safe transition to appropriate next level of care at discharge, Engage patient in therapeutic group addressing interpersonal concerns.  Short Term Goals: Engage patient in aftercare planning with referrals and resources, Increase social support, Increase emotional regulation, Facilitate acceptance of mental health diagnosis and concerns, Identify triggers associated with mental health/substance abuse issues and Increase skills for wellness and recovery  Therapeutic Interventions: Assess for all discharge needs, 1 to 1 time with Social  worker, Explore available resources and support systems, Assess for adequacy in community support network, Educate family and significant other(s) on suicide prevention, Complete Psychosocial Assessment, Interpersonal group therapy.  Evaluation of Outcomes: Progressing   Progress in Treatment: Attending groups: Yes. Participating in groups: Yes. Taking medication as prescribed: Yes. Toleration medication: Yes. Family/Significant other contact made: Yes, individual(s) contacted:  Mother  Patient understands diagnosis: No. Discussing patient identified problems/goals with staff: Yes. Medical problems stabilized or resolved: Yes. Denies suicidal/homicidal ideation: Yes. Issues/concerns per patient self-inventory: No.   New problem(s) identified: No, Describe:  None   New Short Term/Long Term Goal(s): medication stabilization, elimination of SI thoughts, development of comprehensive mental wellness plan.    Patient Goals:  "To go home"  Discharge Plan or Barriers: Patient recently admitted. CSW will continue to follow and assess for appropriate referrals and possible discharge planning.   Reason for Continuation of Hospitalization: Depression Medication stabilization Suicidal ideation Withdrawal symptoms  Estimated Length of Stay: 3 to 5 days   Attendees: Patient: Kelly Hensley  01/12/2021   Physician: Bartholomew Crews, MD 01/12/2021   Nursing:  01/12/2021   RN Care Manager: 01/12/2021   Social Worker: Melba Coon, LCSWA 01/12/2021   Recreational Therapist:  01/12/2021   Other:  01/12/2021   Other:  01/12/2021   Other: 01/12/2021     Scribe  for Treatment Team: Catha Brow 01/12/2021 2:13 PM

## 2021-01-12 NOTE — Progress Notes (Signed)
Pt visible in dayroom much of the evening. Pt given PRN Vistaril, Robaxin, and Trazodone per [facility]    01/12/21 2300  Psych Admission Type (Psych Patients Only)  Admission Status Voluntary  Psychosocial Assessment  Patient Complaints Anxiety;Worrying  Eye Contact Fair  Facial Expression Animated;Anxious;Pensive;Sullen;Sad;Worried  Affect Anxious;Sad;Sullen  Furniture conservator/restorer;Restless  Appearance/Hygiene Improved  Behavior Characteristics Cooperative  Mood Anxious  Thought Process  Coherency Concrete thinking  Content Obsessions  Delusions Controlled  Perception WDL  Hallucination None reported or observed  Judgment Poor  Confusion None  Danger to Self  Current suicidal ideation? Denies  Danger to Others  Danger to Others None reported or observed

## 2021-01-12 NOTE — Progress Notes (Signed)
Progress note    01/12/21 0751  Psych Admission Type (Psych Patients Only)  Admission Status Voluntary  Psychosocial Assessment  Patient Complaints Anxiety;Worrying  Eye Contact Fair  Facial Expression Animated;Anxious;Pensive;Sullen;Sad;Worried  Affect Anxious;Sad;Sullen  Furniture conservator/restorer;Restless  Appearance/Hygiene Improved  Behavior Characteristics Cooperative;Appropriate to situation;Anxious;Fidgety  Mood Anxious;Sad;Sullen;Pleasant  Thought Administrator, sports thinking  Content Obsessions  Delusions Controlled  Perception WDL  Hallucination None reported or observed  Judgment Poor  Confusion None  Danger to Self  Current suicidal ideation? Denies  Danger to Others  Danger to Others None reported or observed

## 2021-01-12 NOTE — [facility] Counselor (Signed)
CSW spoke with [facility] CPS and the report had been screened out.   Fredirick Lathe, LCSWA Clinicial Social Worker [employer]

## 2021-01-12 NOTE — [facility] Suicide Risk Assessment (Signed)
[facility] INPATIENT:  Family/Significant Other Suicide Prevention Education  Suicide Prevention Education:  Education Completed; Kelly Hensley (fiancee) [phone],  (name of family member/significant other) has been identified by the patient as the family member/significant other with whom the patient will be residing, and identified as the person(s) who will aid the patient in the event of a mental health crisis (suicidal ideations/suicide attempt).  With written consent from the patient, the family member/significant other has been provided the following suicide prevention education, prior to the and/or following the discharge of the patient.  The suicide prevention education provided includes the following:  Suicide risk factors  Suicide prevention and interventions  National Suicide Hotline telephone number  [facility] assessment telephone number  [facility] Emergency Assistance 911  [facility] and/or Residential Mobile Crisis Unit telephone number  Request made of family/significant other to:  Remove weapons (e.g., guns, rifles, knives), all items previously/currently identified as safety concern.    Remove drugs/medications (over-the-counter, prescriptions, illicit drugs), all items previously/currently identified as a safety concern.  The family member/significant other verbalizes understanding of the suicide prevention education information provided.  The family member/significant other agrees to remove the items of safety concern listed above.  "She can stay with me and my family in [place]." No weapons in the home. No safety concerns.   Kelly Hensley 01/12/2021, 3:52 PM

## 2021-01-12 NOTE — [facility] Group Notes (Signed)
Adult Psychoeducational Group Note  Date:  01/12/2021 Time:  10:14 PM  Group Topic/Focus:  Wrap-Up Group:   The focus of this group is to help patients review their daily goal of treatment and discuss progress on daily workbooks.  Participation Level:  Active  Participation Quality:  Intrusive and Monopolizing  Affect:  Appropriate  Cognitive:  Alert and Appropriate  Insight: Appropriate and Good  Engagement in Group:  Distracting and Monopolizing  Modes of Intervention:  Discussion, Education and Limit-setting  Additional Comments:  Pt attended and participated in wrap up group this evening and rated their day a 5/10, due to them not D/C today. Pt calmed down when they went outside and they are looking forward to a potential D/C tomorrow.   Chrisandra Netters 01/12/2021, 10:14 PM

## 2021-01-12 NOTE — Progress Notes (Signed)
   01/12/21 0500  Sleep  Number of Hours 3.5

## 2021-01-12 NOTE — Plan of Care (Signed)
  Problem: Education: Goal: Knowledge of [facility] Education information/materials will improve Outcome: Progressing   Problem: Education: Goal: Knowledge of disease or condition will improve Outcome: Progressing Goal: Understanding of discharge needs will improve Outcome: Progressing

## 2021-01-12 NOTE — Progress Notes (Signed)
Pt up complaining of pain, pt given PRN Robaxin per [facility] at [phone]

## 2021-01-12 NOTE — [facility] Suicide Risk Assessment (Signed)
[facility] Discharge Suicide Risk Assessment   Principal Problem: Opiate dependence [facility]) Discharge Diagnoses: Principal Problem:   Opiate dependence (HCC)  Total Time Spent in Direct Patient Care:  I personally spent 30 minutes on the unit in direct patient care. The direct patient care time included face-to-face time with the patient, reviewing the patient's chart, communicating with other professionals, and coordinating care. Greater than 50% of this time was spent in counseling or coordinating care with the patient regarding goals of hospitalization, psycho-education, and discharge planning needs.  Subjective: Patient was seen on rounds and reports fair sleep overnight and fair appetite. She states her mood is "excited" today in terms of knowing she is going home. She describes feeling more hopeful and denies feeling depressed or anxious today. She denies AVH, paranoia, SI or HI. She describes plans to have her children stay with her mother while she lives temporarily with the father of her children after discharge. She agrees to attend SAIOP after discharge. She was encouraged to abstain from illicit substances or prescription drug abuse after discharge.She denies current signs of opiate withdrawal but admits to some cravings. We discussed options for methadone or suboxone programs after discharge. She was made aware that her HIV test results are pending and she agrees to make sure social work has a working phone number to call her for test results after discharge. She was advised to see PCP after discharge for recheck of anemia and UA once she is off her menstrual cycle. She denies UTI symptoms and we discussed that blood in urine is likely secondary to menses. She was advised that she should keep scheduled mental health follow-up appointment after discharge for reassessment of her mood since she has elected not to start scheduled antidepressant during this admission.   Musculoskeletal: Strength & Muscle  Tone: within normal limits Gait & Station: normal, steady Patient leans: N/A  Psychiatric Specialty Exam: Review of Systems  Respiratory: Negative for shortness of breath.   Cardiovascular: Negative for chest pain.  Gastrointestinal: Negative for abdominal pain, diarrhea, nausea and vomiting.  Neurological: Negative for headaches.    Blood pressure 126/89, pulse 87, temperature 98.3 F (36.8 C), temperature source Oral, resp. rate 16, height 5\' 9"  (1.753 m), weight 92.1 kg, SpO2 100 %, not currently breastfeeding.Body mass index is 29.98 kg/m.  General Appearance: casually dressed, adequate hygiene  Eye Contact::  Good  Speech:  Clear and Coherent and Normal Rate  Volume:  Normal  Mood:  Described as "excited and hopeful" - appears euthymic  Affect:  moderate, stable  Thought Process:  Goal Directed and Linear  Orientation:  Oriented to self, month, year and city  Thought Content:  Logical and denies AVH, paranoia, or delusions; no acute psychosis on exam  Suicidal Thoughts:  No  Homicidal Thoughts:  No  Memory:  Recent;   Fair  Judgement:  Fair  Insight:  Fair  Psychomotor Activity:  Normal  Concentration:  Good  Recall:  Fair  Fund of Knowledge:Good  Language: Good  Akathisia:  No  Assets:  Communication Skills Desire for Improvement Resilience Social Support  Sleep:  Number of Hours: 5  Cognition: WNL  ADL's:  Intact   Mental Status Per Nursing Assessment::   On Admission:  Suicidal ideation  Demographic Factors:  Adolescent or young adult and Unemployed, low socioeconomic status  Loss Factors: Financial problems/change in socioeconomic status  Historical Factors: Impulsivity and h/o abuse  Risk Reduction Factors:   Responsible for children under 18 years of  age, Sense of responsibility to family and Positive social support  Continued Clinical Symptoms:  Alcohol/Substance Abuse/Dependencies  Cognitive Features That Contribute To Risk:  None    Suicide  Risk:  Minimal: No identifiable suicidal ideation.  Patients presenting with no risk factors but with morbid ruminations; may be classified as minimal risk based on the severity of the depressive symptoms   Follow-up Information    [facility]. Go on 01/24/2021.   Specialty: Behavioral Health Why: You have an appointment to begin substance abuse intensive outpatient therapy (SAIOP) on 01/24/21 at 1:00 pm.  You also have an appointment for medication management on 02/09/21 at 3:00 pm. These appointments will be held in person. Contact information: [address] [place] [address] [phone]       [facility]. Go on 02/16/2021.   Specialty: Family Medicine Why: You have a hospital follow up appointment for primary care services on 02/16/21 at 9:30 am.  This appointment will be held in person.  You are on a wait list for a sooner appointment. Contact information: [address] [facility] [phone]              Plan Of Care/Follow-up recommendations:  Activity:  as tolerated Diet:  heart healthy Other:  Patient advised to keep scheduled outpatient appointments and to abstain from prescription/illicit drug abuse. She is encouraged to see a primary care physician for recheck of blood in her urine once she is off her menstrual cycle and for recheck of her anemia without fail. She was encouraged to keep scheduled mental health appointment after discharge for help with substance abuse treatment and reassessment of mood.   Comer Locket, MD, FAPA 01/13/2021, 6:59 AM

## 2021-01-12 NOTE — Progress Notes (Signed)
[facility] MD Progress Note  01/12/2021 1:40 PM Kelly Hensley  MRN:  [id number]   Chief Complaint:  Opiate abuse; suicidal ideation  Subjective: Kelly Hensley is a 25 y.o. female with a history of opiate use d/o, who was initially admitted for inpatient psychiatric hospitalization on 01/10/2021 for management of SI and opiate withdrawal. The patient is currently on Hospital Day 2.   Chart Review from last 24 hours:  The patient's chart was reviewed and nursing notes were reviewed. The patient's case was discussed in multidisciplinary team meeting. Per nursing, the patient ws visible some on the unit. No behavioral issues or safety concerns were noted. Per MAR she was compliant with scheduled medications and did receive Vistaril X1 for anxiety, and Zyprexa X1 yesterday and X1 today for agitation. She received Trazodone X1 for sleep and received Naprosyn X2 for pain, Robaxin X2 for spasms, and tylenol X1 for pain.   Information Obtained Today During Patient Interview: The patient was seen and evaluated on the unit. On assessment today the patient reports that she is ready for discharge and is not interested in [place] or residential rehab. She is not sure she wants any outpatient SA treatment after discharge. When I attempted to discuss relapse prevention programs, she states she "has set her mind to not using" after discharge and was disinterested in discussions about outpatient SA treatment. She denies current signs of opiate withdrawal but was confronted with the fact that she has required PRN medications for pain, spasms, and agitation. She denies currently having goose flesh, yawning, shakes, sweats, or muscle spasms. She states she has some residual menstrual cramps but denies other GI issues. She denies current cravings for opiates but is having nicotine withdrawal. When confronted with the fact that her UDS was positive for benzodiazepines and amphetamines in addition to opiates, she states that these other  substances must have been in the heroin/fentanyl she was snorting prior to admission. She states she is mildly anxious and irritable because "I cannot go outside" and because she is missing her children. She denies feeling depressed and denies SI, intent or plan. She denies HI, AVH, or paranoia. She states her sleep was not good because she could not get comfortable in her bed. She reports stable appetite. She was encouraged to attend groups and attend to ADLs.   Principal Problem: Opiate dependence (HCC) Diagnosis: Principal Problem:   Opiate dependence (HCC)  Total Time Spent in Direct Patient Care:  I personally spent 35 minutes on the unit in direct patient care. The direct patient care time included face-to-face time with the patient, reviewing the patient's chart, communicating with other professionals, and coordinating care. Greater than 50% of this time was spent in counseling or coordinating care with the patient regarding goals of hospitalization, psycho-education, and discharge planning needs.  Past Psychiatric History: see admission H&P  Past Medical History:  Past Medical History:  Diagnosis Date  . Anemia   . Anxiety   . Cellulitis and abscess of trunk   . Depression   . 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 Migraines   . Morbid obesity (HCC)   . Sickle cell trait [facility])     Past Surgical History:  Procedure Laterality Date  . ABCESS DRAINAGE Left 2014   Family History:  Family History  Problem Relation Age of Onset  . Diabetes Mother   . Sickle cell anemia Father    Family Psychiatric  History: see admission  H&P  Social History:  Social History   Substance and Sexual Activity  Alcohol Use Yes  . Alcohol/week: 6.0 standard drinks  . Types: 3 Shots of liquor, 3 Glasses of wine per week   Comment: 3 vodkas weekly; wine 3 x week     Social History   Substance and Sexual Activity  Drug Use Yes  . Frequency: 7.0 times per week  . Types:  Marijuana, Heroin, Fentanyl   Comment: daily use     Social History   Socioeconomic History  . Marital status: Single    Spouse name: Not on file  . Number of children: 2  . Years of education: 13  . Highest education level: High school graduate  Occupational History  . Not on file  Tobacco Use  . Smoking status: Current Every Day Smoker    Packs/day: 1.00    Years: 10.00    Pack years: 10.00    Types: Cigarettes    Last attempt to quit: 02/17/2015    Years since quitting: 5.9  . Smokeless tobacco: Never Used  Vaping Use  . Vaping Use: Never used  Substance and Sexual Activity  . Alcohol use: Yes    Alcohol/week: 6.0 standard drinks    Types: 3 Shots of liquor, 3 Glasses of wine per week    Comment: 3 vodkas weekly; wine 3 x week  . Drug use: Yes    Frequency: 7.0 times per week    Types: Marijuana, Heroin, Fentanyl    Comment: daily use   . Sexual activity: Yes    Birth control/protection: Implant  Other Topics Concern  . Not on file  Social History Narrative  . Not on file   Social Determinants of Health   Financial Resource Strain: Not on file  Food Insecurity: Not on file  Transportation Needs: Not on file  Physical Activity: Not on file  Stress: Not on file  Social Connections: Not on file   Additional Social History:    Pain Medications: see MAR Prescriptions: see MAR Over the Counter: see MAR History of alcohol / drug use?: Yes Longest period of sobriety (when/how long): unknown Negative Consequences of Use: Financial,Personal relationships,Work / School Withdrawal Symptoms: Other (Comment),Weakness (anxiety) Name of Substance 1: tobacco 1 - Age of First Use: 25 yrs old  one pack daily 1 - Amount (size/oz): one pack daily 1 - Frequency: daily 1 - Duration: 9 yrs 1 - Last Use / Amount: Saturdat 1 - Method of Aquiring: bought 1- Route of Use: smokes  Sleep: Poor  Appetite:  Good  Current Medications: Current Facility-Administered Medications   Medication Dose Route Frequency Provider Last Rate Last Admin  . acetaminophen (TYLENOL) tablet 650 mg  650 mg Oral Q6H PRN Antonieta Pert, MD   650 mg at 01/11/21 0524  . alum & mag hydroxide-simeth (MAALOX/MYLANTA) 200-200-20 MG/5ML suspension 30 mL  30 mL Oral Q4H PRN Antonieta Pert, MD      . dicyclomine (BENTYL) tablet 20 mg  20 mg Oral Q6H PRN Antonieta Pert, MD      . folic acid (FOLVITE) tablet 1 mg  1 mg Oral Daily Antonieta Pert, MD   1 mg at 01/12/21 0751  . hydrOXYzine (ATARAX/VISTARIL) tablet 25 mg  25 mg Oral Q6H PRN Comer Locket, MD      . levonorgestrel-ethinyl estradiol (ALESSE) 0.1-20 MG-MCG per tablet 1 tablet  1 tablet Oral Daily Antonieta Pert, MD   1 tablet at 01/12/21  [id number]  . loperamide (IMODIUM) capsule 2-4 mg  2-4 mg Oral PRN Antonieta Pertlary, Greg Lawson, MD      . LORazepam (ATIVAN) tablet 1 mg  1 mg Oral Q6H PRN Antonieta Pertlary, Greg Lawson, MD      . magnesium hydroxide (MILK OF MAGNESIA) suspension 30 mL  30 mL Oral Daily PRN Antonieta Pertlary, Greg Lawson, MD      . methocarbamol (ROBAXIN) tablet 500 mg  500 mg Oral Q8H PRN Antonieta Pertlary, Greg Lawson, MD   500 mg at 01/12/21 0346  . naproxen (NAPROSYN) tablet 500 mg  500 mg Oral BID PRN Antonieta Pertlary, Greg Lawson, MD   500 mg at 01/11/21 2116  . nicotine (NICODERM CQ - dosed in mg/24 hours) patch 14 mg  14 mg Transdermal Daily Comer LocketSingleton, Asiya Cutbirth E, MD   14 mg at 01/12/21 0955  . ondansetron (ZOFRAN-ODT) disintegrating tablet 4 mg  4 mg Oral Q6H PRN Antonieta Pertlary, Greg Lawson, MD      . SUMAtriptan [facility](IMITREX) tablet 50 mg  50 mg Oral Q2H PRN Antonieta Pertlary, Greg Lawson, MD      . thiamine tablet 100 mg  100 mg Oral Daily Antonieta Pertlary, Greg Lawson, MD   100 mg at 01/12/21 0751  . traZODone (DESYREL) tablet 100 mg  100 mg Oral QHS PRN Comer LocketSingleton, Talisa Petrak E, MD        Lab Results:  Results for orders placed or performed during the hospital encounter of 01/10/21 (from the past 48 hour(s))  TSH     Status: None   Collection Time: 01/11/21  6:20 AM  Result Value Ref Range    TSH 0.782 0.350 - 4.500 uIU/mL    Comment: Performed by a 3rd Generation assay with a functional sensitivity of <=0.01 uIU/mL. Performed at [facility], [address] [place], [place] [address]   Hemoglobin A1c     Status: Abnormal   Collection Time: 01/11/21  6:20 AM  Result Value Ref Range   Hgb A1c MFr Bld 4.5 (L) 4.8 - 5.6 %    Comment: (NOTE) Pre diabetes:          5.7%-6.4%  Diabetes:              >6.4%  Glycemic control for   <7.0% adults with diabetes    Mean Plasma Glucose 82.45 mg/dL    Comment: Performed at [facility], [address] [place], [place] [address]  Lipid panel     Status: Abnormal   Collection Time: 01/11/21  6:20 AM  Result Value Ref Range   Cholesterol 139 0 - 200 mg/dL   Triglycerides 191124 <478<150 mg/dL   HDL 32 (L) >29>40 mg/dL   Total CHOL/HDL Ratio 4.3 RATIO   VLDL 25 0 - 40 mg/dL   LDL Cholesterol 82 0 - 99 mg/dL    Comment:        Total Cholesterol/HDL:CHD Risk Coronary Heart Disease Risk Table                     Men   Women  1/2 Average Risk   3.4   3.3  Average Risk       5.0   4.4  2 X Average Risk   9.6   7.1  3 X Average Risk  23.4   11.0        Use the calculated Patient Ratio above and the CHD Risk Table to determine the patient's CHD Risk.  ATP III CLASSIFICATION (LDL):  <100     mg/dL   Optimal  161-096  mg/dL   Near or Above                    Optimal  130-159  mg/dL   Borderline  045-409  mg/dL   High  >811     mg/dL   Very High Performed at [facility], [address], [place], [place] [address]   Hepatitis panel, acute     Status: None   Collection Time: 01/11/21  6:20 AM  Result Value Ref Range   Hepatitis B Surface Ag NON REACTIVE NON REACTIVE   HCV Ab NON REACTIVE NON REACTIVE    Comment: (NOTE) Nonreactive HCV antibody screen is consistent with no HCV infections,  unless recent infection is suspected or other evidence exists to indicate HCV infection.     Hep  A IgM NON REACTIVE NON REACTIVE   Hep B C IgM NON REACTIVE NON REACTIVE    Comment: Performed at [facility], [address] [place], [place] [address]  RPR     Status: None   Collection Time: 01/11/21  6:20 AM  Result Value Ref Range   RPR Ser Ql NON REACTIVE NON REACTIVE    Comment: Performed at [facility], [address] [place], [place] [address]  Urinalysis, Complete w Microscopic Urine, Unspecified Source     Status: Abnormal   Collection Time: 01/11/21  7:46 PM  Result Value Ref Range   Color, Urine YELLOW YELLOW   APPearance CLEAR CLEAR   Specific Gravity, Urine 1.010 1.005 - 1.030   pH 6.0 5.0 - 8.0   Glucose, UA NEGATIVE NEGATIVE mg/dL   Hgb urine dipstick MODERATE (A) NEGATIVE   Bilirubin Urine NEGATIVE NEGATIVE   Ketones, ur 5 (A) NEGATIVE mg/dL   Protein, ur NEGATIVE NEGATIVE mg/dL   Nitrite NEGATIVE NEGATIVE   Leukocytes,Ua NEGATIVE NEGATIVE   RBC / HPF 11-20 0 - 5 RBC/hpf   WBC, UA 0-5 0 - 5 WBC/hpf   Bacteria, UA RARE (A) NONE SEEN   Squamous Epithelial / LPF 0-5 0 - 5   Mucus PRESENT     Comment: Performed at [facility], [address] [place], [place] [address]    Blood Alcohol level:  Lab Results  Component Value Date   ETH <10 01/08/2021    Metabolic Disorder Labs: Lab Results  Component Value Date   HGBA1C 4.5 (L) 01/11/2021   MPG 82.45 01/11/2021   No results found for: PROLACTIN Lab Results  Component Value Date   CHOL 139 01/11/2021   TRIG 124 01/11/2021   HDL 32 (L) 01/11/2021   CHOLHDL 4.3 01/11/2021   VLDL 25 01/11/2021   LDLCALC 82 01/11/2021    Physical Findings: AIMS: Facial and Oral Movements Muscles of Facial Expression: None, normal Lips and Perioral Area: None, normal Jaw: None, normal Tongue: None, normal,Extremity Movements Upper (arms, wrists, hands, fingers): None, normal Lower (legs, knees, ankles, toes): None, normal, Trunk Movements Neck, shoulders, hips: None, normal, Overall  Severity Severity of abnormal movements (highest score from questions above): None, normal Incapacitation due to abnormal movements: None, normal Patient's awareness of abnormal movements (rate only patient's report): No Awareness, Dental Status Current problems with teeth and/or dentures?: No Does patient usually wear dentures?: No  CIWA:  CIWA-Ar Total: 1 COWS:  COWS Total Score: 2  Musculoskeletal: Strength & Muscle Tone: within normal limits  Gait & Station: normal, steady Patient leans: N/A  Psychiatric Specialty Exam: Physical Exam Vitals reviewed.  HENT:     Head: Normocephalic.  Pulmonary:     Effort: Pulmonary effort is normal.  Neurological:     General: No focal deficit present.     Mental Status: She is alert.     Review of Systems  Respiratory: Negative for shortness of breath.   Cardiovascular: Negative for chest pain.  Gastrointestinal: Negative for diarrhea, nausea and vomiting.  Neurological: Negative for headaches.    Blood pressure 135/77, pulse 91, temperature 99 F (37.2 C), temperature source Oral, resp. rate 16, height 5\' 9"  (1.753 m), weight 92.1 kg, SpO2 100 %, not currently breastfeeding.Body mass index is 29.98 kg/m.  General Appearance: casually dressed, fair hygiene  Eye Contact:  Fair  Speech:  Clear and Coherent and Normal Rate  Volume:  Normal  Mood:  Irritable  Affect:  Constricted  Thought Process:  Goal Directed and Linear  Orientation:  Full (Time, Place, and Person)  Thought Content:  Logical and denies AVH, paranoia, or delusions; no acute psychosis on exam  Suicidal Thoughts:  No  Homicidal Thoughts:  No  Memory:  Recent;   Fair  Judgement:  Impaired  Insight:  Fair  Psychomotor Activity:  Normal  Concentration:  Concentration: Fair and Attention Span: Fair  Recall:  of Knowledge:  Fair  Language:  Good  Akathisia:  Negative  Assets:  Communication Skills Desire for Improvement Resilience  ADL's:  Intact   Cognition:  WNL  Sleep:  Number of Hours: 3.5   Treatment Plan Summary: Diagnoses / Active Problems: Opiate use d/o (r/o substance induced mood d/o)  PLAN: 1. Safety and Monitoring:  -- Voluntary admission to inpatient psychiatric unit for safety, stabilization and treatment  -- Daily contact with patient to assess and evaluate symptoms and progress in treatment  -- Patient's case to be discussed in multi-disciplinary team meeting  -- Observation Level : q15 minute checks  -- Vital signs:  q12 hours  -- Precautions: suicide  2. Psychiatric Diagnoses and Treatment:   Opiate use d/o  -- UDS positive for opiates, benzodiazepines, and amphetamines  -- Patient will remain on COWS with PRNs available for opiate withdrawal (recent scores: 2,2)  -- Given positive benzodiazepines in UDS she will remain on CIWA protocol for withdrawal monitoring in the event she is minimizing use of other substances prior to admission - has Ativan 1mg  for CIWA >10 and will continue oral thiamine and MVI replacement (recent score 1)  -- Discussed options for SA treatment after discharge and she is considering options  -- Discussed option of starting a scheduled medication to help with sleep and residual mood issues as she continues to detox from opiates and she declines - she opts to continue Trazodone PRN for insomnia and will increase to 100mg  qhs and was advised to try Vistaril 25mg  q6 hours PRN anxiety  -- RPR nonreactive; HIV pending; acute hepatitis panel nonreactive  -- Encouraged patient to participate in unit milieu and in scheduled group therapies   -- Short Term Goals: Ability to identify changes in lifestyle to reduce recurrence of condition will improve and Ability to identify triggers associated with substance abuse/mental health issues will improve  -- Long Term Goals: Improvement in symptoms so as ready for discharge   3. Medical Issues Being Addressed:   Tobacco Use Disorder  -- Nicotine patch  14mg /24 hours ordered  -- Smoking cessation encouraged  Mild anemia (H/H 11.7/34.1)  -- will need to f/u with PCP after discharge - appears H/H has been this low 3 years ago   UA with moderate blood and rare bacteria  -- patient on menses and UA negative for nitrites, leukocytes or WBC with ketones improving- will need repeat UA with PCP after discharge; currently asymptomatic for UTI  4. Discharge Planning:   -- Social work and case management to assist with discharge planning and identification of hospital follow-up needs prior to discharge  -- Estimated LOS: 1-2 days  -- Discharge Concerns: Need to establish a safety plan; Medication compliance and effectiveness  -- Discharge Goals: Return home with outpatient referrals for mental health follow-up including medication management/psychotherapy  Comer Locket, MD, FAPA 01/12/2021, 1:40 PM

## 2021-01-12 NOTE — [facility] Suicide Risk Assessment (Addendum)
[facility] INPATIENT:  Family/Significant Other Suicide Prevention Education  Suicide Prevention Education:  Education Completed; Kelly Hensley (mother) [phone],  (name of family member/significant other) has been identified by the patient as the family member/significant other with whom the patient will be residing, and identified as the person(s) who will aid the patient in the event of a mental health crisis (suicidal ideations/suicide attempt).  With written consent from the patient, the family member/significant other has been provided the following suicide prevention education, prior to the and/or following the discharge of the patient.  The suicide prevention education provided includes the following:  Suicide risk factors  Suicide prevention and interventions  National Suicide Hotline telephone number  [facility] assessment telephone number  [facility] Emergency Assistance 911  [facility] and/or Residential Mobile Crisis Unit telephone number  Request made of family/significant other to:  Remove weapons (e.g., guns, rifles, knives), all items previously/currently identified as safety concern.    Remove drugs/medications (over-the-counter, prescriptions, illicit drugs), all items previously/currently identified as a safety concern.  The family member/significant other verbalizes understanding of the suicide prevention education information provided.  The family member/significant other agrees to remove the items of safety concern listed above.  "She was very depressed and not coping well and so she has been using drugs to cope. I thought she was going to die of drugs. She shut down and did not want to be bothered.She lost interest in everything. She kept saying she wish she wasn't here anymore and everyone will be better off without me. She has always been scared of being alone. She has been worried that we will wash our hands of her if she goes to treatment.  She cannot come and live with me at discharge and I have already told her that. She does not get along with my husband. She cannot keep a job. I do not have any safety concerns. I will keep her kids if she will go to a facility where she can get on her feet.". No access to firearms confirmed     Kelly Hensley 01/12/2021, 3:44 PM

## 2021-01-12 NOTE — [facility] Group Notes (Signed)
LCSW Group Therapy Note   01/12/2021 1:15pm   Type of Therapy and Topic:  Group Therapy:  Positive Affirmations   Participation Level:  Active  Description of Group: This group addressed positive affirmation toward self and others. Patients went around the room and identified two positive things about themselves and two positive things about a peer in the room. Patients reflected on how it felt to share something positive with others, to identify positive things about themselves, and to hear positive things from others. Patients were encouraged to have a daily reflection of positive characteristics or circumstances.  Therapeutic Goals 1. Patient will verbalize two of their positive qualities 2. Patient will demonstrate empathy for others by stating two positive qualities about a peer in the group 3. Patient will verbalize their feelings when voicing positive self affirmations and when voicing positive affirmations of others 4. Patients will discuss the potential positive impact on their wellness/recovery of focusing on positive traits of self and others. Summary of Patient Progress:  Pt shared that she sticks to what she says and does for her children. Pt was asked many times to stop having side chatter with peers during groups but continued to do so and was unable to be redirected.     Therapeutic Modalities Cognitive Behavioral Therapy Motivational Interviewing  Chrys Racer 01/12/2021 2:21 PM

## 2021-01-12 NOTE — Progress Notes (Addendum)
Recreation Therapy Notes  Date:  4.27.22 Time: 0930 Location: [address] Dayroom  Group Topic: Stress Management  Goal Area(s) Addresses:  Patient will identify positive stress management techniques. Patient will identify benefits of using stress management post d/c.  Behavioral Response: Engaged  Intervention: Stress Management  Activity: Meditation.  LRT played a meditation that focused on letting go of the past and not letting it hold you back from progressing in life.    Education:  Stress Management, Discharge Planning.   Education Outcome: Acknowledges Education  Clinical Observations/Feedback: Pt attended and participated in group session.     Caroll Rancher, LRT/CTRS         Lillia Abed, Carlester Kasparek A 01/12/2021 11:04 AM

## 2021-01-13 DIAGNOSIS — F1120 Opioid dependence, uncomplicated: Principal | ICD-10-CM

## 2021-01-13 LAB — HIV-1 RNA QUANT-NO REFLEX-BLD
HIV 1 RNA Quant: <20 copies/mL
LOG10 HIV-1 RNA: UNDETERMINED log10copy/mL

## 2021-01-13 MED ORDER — HYDROXYZINE HCL 25 MG PO TABS: 25.0000 mg | ORAL_TABLET | Freq: Four times a day (QID) | ORAL | 0 refills | Status: DC | PRN

## 2021-01-13 MED ORDER — TRAZODONE HCL 100 MG PO TABS: 100.0000 mg | ORAL_TABLET | Freq: Every evening | ORAL | 0 refills | Status: DC | PRN

## 2021-01-13 MED ORDER — NICOTINE 14 MG/24HR TD PT24: 14.0000 mg | MEDICATED_PATCH | Freq: Every day | TRANSDERMAL | 0 refills | Status: DC

## 2021-01-13 NOTE — Progress Notes (Signed)
   01/13/21 0606  Vital Signs  Pulse Rate 87  BP 126/89  BP Location Right Arm  BP Method Automatic  Patient Position (if appropriate) Standing  Sleep  Number of Hours 5

## 2021-01-13 NOTE — Progress Notes (Signed)
Pt discharged to lobby. Pt was stable and appreciative at that time. All papers and prescriptions were given and valuables returned. Verbal understanding expressed. Denies SI/HI and A/VH. Pt given opportunity to express concerns and ask questions.  

## 2021-01-13 NOTE — Discharge Summary (Signed)
Physician Discharge Summary Note  Patient:  Kelly Hensley is an 25 y.o., female MRN:  [id number] DOB:  March 12, 1996 Patient phone:  [phone] (home)  Patient address:   [address] [place] [address],  Total Time spent with patient: Greater than 30 minutes  Date of Admission:  01/10/2021 Date of Discharge: 01-13-21  Reason for Admission: Suicidal ideations & request for opioid detoxification treatments.  Principal Problem: Opioid use disorder, severe, dependence (HCC)  Discharge Diagnoses: Principal Problem:   Opioid use disorder, severe, dependence (HCC)  Past Psychiatric History: Opioid use disorder.  Past Medical History:  Past Medical History:  Diagnosis Date  . Anemia   . Anxiety   . Cellulitis and abscess of trunk   . Depression   . 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 Migraines   . Morbid obesity (HCC)   . Sickle cell trait [facility])     Past Surgical History:  Procedure Laterality Date  . ABCESS DRAINAGE Left 2014   Family History:  Family History  Problem Relation Age of Onset  . Diabetes Mother   . Sickle cell anemia Father    Family Psychiatric  History: See H&P  Social History:  Social History   Substance and Sexual Activity  Alcohol Use Yes  . Alcohol/week: 6.0 standard drinks  . Types: 3 Shots of liquor, 3 Glasses of wine per week   Comment: 3 vodkas weekly; wine 3 x week     Social History   Substance and Sexual Activity  Drug Use Yes  . Frequency: 7.0 times per week  . Types: Marijuana, Heroin, Fentanyl   Comment: daily use     Social History   Socioeconomic History  . Marital status: Single    Spouse name: Not on file  . Number of children: 2  . Years of education: 59  . Highest education level: High school graduate  Occupational History  . Not on file  Tobacco Use  . Smoking status: Current Every Day Smoker    Packs/day: 1.00    Years: 10.00    Pack years: 10.00    Types: Cigarettes    Last  attempt to quit: 02/17/2015    Years since quitting: 5.9  . Smokeless tobacco: Never Used  Vaping Use  . Vaping Use: Never used  Substance and Sexual Activity  . Alcohol use: Yes    Alcohol/week: 6.0 standard drinks    Types: 3 Shots of liquor, 3 Glasses of wine per week    Comment: 3 vodkas weekly; wine 3 x week  . Drug use: Yes    Frequency: 7.0 times per week    Types: Marijuana, Heroin, Fentanyl    Comment: daily use   . Sexual activity: Yes    Birth control/protection: Implant  Other Topics Concern  . Not on file  Social History Narrative  . Not on file   Social Determinants of Health   Financial Resource Strain: Not on file  Food Insecurity: Not on file  Transportation Needs: Not on file  Physical Activity: Not on file  Stress: Not on file  Social Connections: Not on file   Hospital Course: (Per Md's admission evaluation notes): Patient is a 25 year old female with a past psychiatric history significant for opiate dependence who presented to the [facility] emergency department on 01/08/2021 requesting detox and treatment for opiate dependence. The patient admitted that she was addicted to fentanyl as well as heroin for the  last 2 years. She stated that she snorts these medications but had never used IV drugs. She stated that she had most recently got hired for a job, but wanted to get clean for her 2 young children. She stated she was using approximately $40-$60 a day of drugs. But was unclear on exactly what she was purchasing. She admitted to passive suicidal ideation. She denied any auditory or visual hallucinations. She denied any psychosis. She denied any homicidal ideation. She stated that she had never been admitted to the psychiatric hospital before, had never been in detox before, never been in substance rehabilitation. She was transferred to our facility on 01/11/2021.   Prior to this discharge, Kelly Hensley was seen & evaluated for mental health  stability. The current laboratory findings were reviewed (stable), nurses notes & vital signs were reviewed as well. There are no current mental health or medical issues that should prevent this discharge at this time. Patient is being discharged to continue mental health care & IOP as noted below.  After the above admission evaluation, Kelly Hensley was recommended for mood stabilization as well Benzodiazepine/opioid detoxification treatments. Her UDS on arrival at the hospital was positive for Amphetamine, Benzodiazepine & opioid, The medication regimen for her presenting symptoms were discussed & with her consent initiated. She received the CIWA & the COWS detox protocols on a prn basis. She was also treated, stabilized & was discharged on the medications for her other symptoms as listed below on her discharge medication lists. She was enrolled & participated in the group counseling sessions being offered & held on this unit. She learned coping skills. She presented on this admission, no other significant pre-existing medical conditions that required treatment & monitoring. She tolerated her treatment regimen without any adverse effects or reactions reported.   Kelly Hensley's symptoms responded well to her treatment regimen warranting this discharge. This is evidenced by her reports of improved mood, absence of substance withdrawal symptoms & or suicidal ideations. During the course of her hospitalization, the 15-minute checks were adequate to ensure Kelly Hensley's safety. Patient did not display any dangerous, violent or suicidal behavior on the unit.  She interacted with patients & staff appropriately, participated appropriately in the group sessions/therapies. Her medications were addressed & adjusted to meet her needs. She was recommended for outpatient follow-up care & medication management & IOP upon discharge to assure she maintains sobriety & mood stability.  At the time of discharge patient is not reporting any acute  suicidal/homicidal ideations. She currently denies any new issues or concerns. Education and supportive counseling provided throughout her hospital stay & upon discharge.   Today upon her discharge evaluation with the attending psychiatrist, Delanda shares she is doing well. She denies any other specific concerns. She is sleeping well. Her appetite is good. She denies other physical complaints. She denies AH/VH, delusional thoughts, paranoia or substance withdrawal symptoms. She feels that her medications have been helpful & is in agreement to continue her current treatment regimen as recommended. She was able to engage in safety planning including plan to return to [facility] or contact emergency services if she feels unable to maintain her own safety or the safety of others. Pt had no further questions, comments, or concerns. She left [facility] with all personal belongings in no apparent distress. Transportation per her father.  Physical Findings: AIMS: Facial and Oral Movements Muscles of Facial Expression: None, normal Lips and Perioral Area: None, normal Jaw: None, normal Tongue: None, normal,Extremity Movements Upper (arms, wrists, hands, fingers): None, normal  Lower (legs, knees, ankles, toes): None, normal, Trunk Movements Neck, shoulders, hips: None, normal, Overall Severity Severity of abnormal movements (highest score from questions above): None, normal Incapacitation due to abnormal movements: None, normal Patient's awareness of abnormal movements (rate only patient's report): No Awareness, Dental Status Current problems with teeth and/or dentures?: No Does patient usually wear dentures?: No  CIWA:  CIWA-Ar Total: 2 COWS:  COWS Total Score: 3  Musculoskeletal: Strength & Muscle Tone: within normal limits Gait & Station: normal Patient leans: N/A  Psychiatric Specialty Exam:  Presentation  General Appearance: Appropriate for Environment; Casual; Fairly Groomed  Eye  Contact:Good  Speech:Clear and Coherent; Normal Rate  Speech Volume:Normal  Handedness:Right  Mood and Affect  Mood:Euthymic  Affect:Flat  Thought Process  Thought Processes:Coherent; Goal Directed  Descriptions of Associations:Intact  Orientation:Full (Time, Place and Person)  Thought Content:Logical  History of Schizophrenia/Schizoaffective disorder:No  Duration of Psychotic Symptoms:No data recorded Hallucinations:Hallucinations: None  Ideas of Reference:None  Suicidal Thoughts:Suicidal Thoughts: No SI Passive Intent and/or Plan: Without Intent; Without Plan; Without Means to Carry Out  Homicidal Thoughts:Homicidal Thoughts: No  Sensorium  Memory:Immediate Good; Recent Good; Remote Good  Judgment:Good  Insight:Good  Executive Functions  Concentration:Good  Attention Span:Good  Recall:Good  Fund of Knowledge:Good  Language:Good  Psychomotor Activity  Psychomotor Activity:Psychomotor Activity: Normal  Assets  Assets:Communication Skills; Desire for Improvement; Social Support; Resilience; Physical Health  Sleep  Sleep:Sleep: Good Number of Hours of Sleep: 7.5   Physical Exam: Physical Exam Vitals and nursing note reviewed.  HENT:     Head: Normocephalic.     Nose: Nose normal.     Mouth/Throat:     Pharynx: Oropharynx is clear.  Eyes:     Pupils: Pupils are equal, round, and reactive to light.  Cardiovascular:     Rate and Rhythm: Normal rate.     Pulses: Normal pulses.  Pulmonary:     Effort: Pulmonary effort is normal.  Genitourinary:    Comments: Deferred Musculoskeletal:        General: Normal range of motion.     Cervical back: Normal range of motion.  Skin:    General: Skin is warm and dry.  Neurological:     General: No focal deficit present.     Mental Status: She is alert and oriented to person, place, and time. Mental status is at baseline.    Review of Systems  Constitutional: Negative for chills, fever and  malaise/fatigue.  HENT: Negative.   Eyes: Negative.   Respiratory: Negative for cough, shortness of breath and wheezing.   Cardiovascular: Negative for chest pain and palpitations.  Gastrointestinal: Negative for abdominal pain, constipation, diarrhea, heartburn, nausea and vomiting.  Genitourinary: Negative for dysuria.  Musculoskeletal: Negative for joint pain and myalgias.  Skin: Negative.   Neurological: Negative for dizziness, tingling, tremors, sensory change, speech change, focal weakness, seizures, loss of consciousness, weakness and headaches.  Endo/Heme/Allergies:       Allergies: NKDA  Psychiatric/Behavioral: Positive for substance abuse (Hx. Amphetamin, Benzodiazepine & opioid use disorders ). Negative for depression, hallucinations, memory loss and suicidal ideas. The patient has insomnia (Hx. of (stabilized upon discharge)). The patient is not nervous/anxious (Stable upon discharge).    Blood pressure 126/89, pulse 87, temperature 98.3 F (36.8 C), temperature source Oral, resp. rate 16, height  (1.753 m), weight 92.1 kg, SpO2 100 %, not currently breastfeeding. Body mass index is 29.98 kg/m.  Have you used any form of tobacco in the last 30 days? (Cigarettes,  Smokeless Tobacco, Cigars, and/or Pipes): Yes  Has this patient used any form of tobacco in the last 30 days? (Cigarettes, Smokeless Tobacco, Cigars, and/or Pipes): Yes, an FDA-approved tobacco cessation medication was offered at discharge.  Blood Alcohol level:  Lab Results  Component Value Date   ETH <10 01/08/2021    Metabolic Disorder Labs:  Lab Results  Component Value Date   HGBA1C 4.5 (L) 01/11/2021   MPG 82.45 01/11/2021   No results found for: PROLACTIN Lab Results  Component Value Date   CHOL 139 01/11/2021   TRIG 124 01/11/2021   HDL 32 (L) 01/11/2021   CHOLHDL 4.3 01/11/2021   VLDL 25 01/11/2021   LDLCALC 82 01/11/2021    See Psychiatric Specialty Exam and Suicide Risk Assessment  completed by Attending Physician prior to discharge.  Discharge destination:  Home  Is patient on multiple antipsychotic therapies at discharge:  No   Has Patient had three or more failed trials of antipsychotic monotherapy by history:  No  Recommended Plan for Multiple Antipsychotic Therapies: NA  Allergies as of 01/13/2021   No Known Allergies     Medication List    STOP taking these medications   metroNIDAZOLE 500 MG tablet Commonly known as: FLAGYL   Vienva 0.1-20 MG-MCG tablet Generic drug: levonorgestrel-ethinyl estradiol     TAKE these medications     Indication  hydrOXYzine 25 MG tablet Commonly known as: ATARAX/VISTARIL Take 1 tablet (25 mg total) by mouth every 6 (six) hours as needed for anxiety.  Indication: Feeling Anxious   NEXPLANON [place] Inject 1 each into the skin continuous.  Indication: Birth control   nicotine 14 mg/24hr patch Commonly known as: NICODERM CQ - dosed in mg/24 hours Place 1 patch (14 mg total) onto the skin daily. (May buy from over the counter): For smoking Start taking on: January 14, 2021  Indication: Nicotine Addiction   traZODone 100 MG tablet Commonly known as: DESYREL Take 1 tablet (100 mg total) by mouth at bedtime as needed for sleep.  Indication: Trouble Sleeping       Follow-up Information    [facility]. Go on 01/[facility]/2021.   Specialty: Behavioral Health Why: You have an appointment to begin substance abuse intensive outpatient therapy (SAIOP) on 01/24/21 at 1:00 pm.  You also have an appointment for medication management on 02/09/21 at 3:00 pm. These appointments will be held in person. Contact information: [address] [place] [phone]       [facility]. Go on 02/16/2021.   Specialty: Family Medicine Why: You have a hospital follow up appointment for primary care services on 02/16/21 at 9:30 am.  This appointment will be held in person.  You are on  a wait list for a sooner appointment. Contact information: [place] [facility] [phone]             Follow-up recommendations: Activity:  As tolerated Diet: As recommended by your primary care doctor. Keep all scheduled follow-up appointments as recommended.    Comments: Prescriptions given at discharge.  Patient agreeable to plan.  Given opportunity to ask questions.  Appears to feel comfortable with discharge denies any current suicidal or homicidal thought. Patient is also instructed prior to discharge to: Take all medications as prescribed by his/her mental healthcare provider. Report any adverse effects and or reactions from the medicines to his/her outpatient provider promptly. Patient has been instructed & cautioned: To not engage in  alcohol and or illegal drug use while on prescription medicines. In the event of worsening symptoms, patient is instructed to call the crisis hotline, 911 and or go to the nearest ED for appropriate evaluation and treatment of symptoms. To follow-up with his/her primary care provider for your other medical issues, concerns and or health care needs.  Signed: Armandina Stammer, NP, PMHNP, FNP-BC 01/13/2021, 10:13 AM

## 2021-01-13 NOTE — Progress Notes (Signed)
  [facility] Adult Case Management Discharge Plan :  Will you be returning to the same living situation after discharge:  No. Will be staying with childrens father. At discharge, do you have transportation home?: Yes,  sister is to pick this patient up Do you have the ability to pay for your medications: Yes,  has insurance  Release of information consent forms completed and in the chart;  Patient's signature needed at discharge.  Patient to Follow up at:  Follow-up Information    [facility]. Go on 01/24/2021.   Specialty: Behavioral Health Why: You have an appointment to begin substance abuse intensive outpatient therapy (SAIOP) on 01/24/21 at 1:00 pm.  You also have an appointment for medication management on 02/09/21 at 3:00 pm. These appointments will be held in person. Contact information: [address] [place] [address] [phone]       [facility]. Go on 02/16/2021.   Specialty: Family Medicine Why: You have a hospital follow up appointment for primary care services on 02/16/21 at 9:30 am.  This appointment will be held in person.  You are on a wait list for a sooner appointment. Contact information: [address] [place] [facility] [phone]              Next level of care provider has access to [facility] Link:yes  Safety Planning and Suicide Prevention discussed: Yes,  with mother  Have you used any form of tobacco in the last 30 days? (Cigarettes, Smokeless Tobacco, Cigars, and/or Pipes): Yes  Has patient been referred to the Quitline?: Patient refused referral  Patient has been referred for addiction treatment: Yes  Otelia Santee, LCSW 01/13/2021, 9:31 AM

## 2021-01-24 ENCOUNTER — Ambulatory Visit (HOSPITAL_COMMUNITY): Payer: Medicaid Other | Admitting: Behavioral Health

## 2021-02-09 ENCOUNTER — Encounter (HOSPITAL_COMMUNITY): Payer: Medicaid Other | Admitting: Psychiatry

## 2021-02-16 ENCOUNTER — Inpatient Hospital Stay (INDEPENDENT_AMBULATORY_CARE_PROVIDER_SITE_OTHER): Payer: Medicaid Other | Admitting: Primary Care

## 2022-06-24 ENCOUNTER — Encounter (HOSPITAL_COMMUNITY): Payer: Self-pay

## 2022-06-24 ENCOUNTER — Emergency Department (HOSPITAL_COMMUNITY)
Admission: EM | Admit: 2022-06-24 | Discharge: 2022-06-25 | Disposition: A | Discharge status: Other Acute Inpatient Hospital | Payer: Medicaid Other | Source: Home / Self Care

## 2022-06-24 ENCOUNTER — Other Ambulatory Visit: Payer: Self-pay

## 2022-06-24 DIAGNOSIS — F1910 Other psychoactive substance abuse, uncomplicated: Secondary | ICD-10-CM | POA: Insufficient documentation

## 2022-06-24 DIAGNOSIS — R45851 Suicidal ideations: Secondary | ICD-10-CM | POA: Insufficient documentation

## 2022-06-24 DIAGNOSIS — F329 Major depressive disorder, single episode, unspecified: Secondary | ICD-10-CM | POA: Insufficient documentation

## 2022-06-24 DIAGNOSIS — F1990 Other psychoactive substance use, unspecified, uncomplicated: Secondary | ICD-10-CM

## 2022-06-24 DIAGNOSIS — R110 Nausea: Secondary | ICD-10-CM | POA: Insufficient documentation

## 2022-06-24 DIAGNOSIS — Z20822 Contact with and (suspected) exposure to covid-19: Secondary | ICD-10-CM | POA: Insufficient documentation

## 2022-06-24 LAB — CBC WITH DIFFERENTIAL/PLATELET
Abs Immature Granulocytes: 0.04 K/uL (ref 0.00–0.07)
Basophils Absolute: 0 K/uL (ref 0.0–0.1)
Basophils Relative: 1 %
Eosinophils Absolute: 0 K/uL (ref 0.0–0.5)
Eosinophils Relative: 0 %
HCT: 43.2 % (ref 36.0–46.0)
Hemoglobin: 14.9 g/dL (ref 12.0–15.0)
Immature Granulocytes: 1 %
Lymphocytes Relative: 22 %
Lymphs Abs: 1.7 K/uL (ref 0.7–4.0)
MCH: 25.1 pg — ABNORMAL LOW (ref 26.0–34.0)
MCHC: 34.5 g/dL (ref 30.0–36.0)
MCV: 72.7 fL — ABNORMAL LOW (ref 80.0–100.0)
Monocytes Absolute: 0.6 K/uL (ref 0.1–1.0)
Monocytes Relative: 8 %
Neutro Abs: 5.6 K/uL (ref 1.7–7.7)
Neutrophils Relative %: 68 %
Platelets: 374 K/uL (ref 150–400)
RBC: 5.94 MIL/uL — ABNORMAL HIGH (ref 3.87–5.11)
RDW: 14.3 % (ref 11.5–15.5)
WBC: 8 K/uL (ref 4.0–10.5)
nRBC: 0 % (ref 0.0–0.2)

## 2022-06-24 LAB — RESP PANEL BY RT-PCR (FLU A&B, COVID) ARPGX2
Influenza A by PCR: NEGATIVE
Influenza B by PCR: NEGATIVE
SARS Coronavirus 2 by RT PCR: NEGATIVE

## 2022-06-24 LAB — RAPID URINE DRUG SCREEN, HOSP PERFORMED
Amphetamines: NOT DETECTED
Barbiturates: NOT DETECTED
Benzodiazepines: POSITIVE — AB
Cocaine: NOT DETECTED
Opiates: NOT DETECTED
Tetrahydrocannabinol: NOT DETECTED

## 2022-06-24 LAB — COMPREHENSIVE METABOLIC PANEL WITH GFR
ALT: 9 U/L (ref 0–44)
AST: 10 U/L — ABNORMAL LOW (ref 15–41)
Albumin: 4.1 g/dL (ref 3.5–5.0)
Alkaline Phosphatase: 42 U/L (ref 38–126)
Anion gap: 11 (ref 5–15)
BUN: 14 mg/dL (ref 6–20)
CO2: 26 mmol/L (ref 22–32)
Calcium: 9.1 mg/dL (ref 8.9–10.3)
Chloride: 102 mmol/L (ref 98–111)
Creatinine, Ser: 1.13 mg/dL — ABNORMAL HIGH (ref 0.44–1.00)
GFR, Estimated: >60 mL/min
Glucose, Bld: 106 mg/dL — ABNORMAL HIGH (ref 70–99)
Potassium: 3.3 mmol/L — ABNORMAL LOW (ref 3.5–5.1)
Sodium: 139 mmol/L (ref 135–145)
Total Bilirubin: 0.6 mg/dL (ref 0.3–1.2)
Total Protein: 7.2 g/dL (ref 6.5–8.1)

## 2022-06-24 LAB — I-STAT BETA HCG BLOOD, ED (MC, WL, AP ONLY): I-stat hCG, quantitative: <5 m[IU]/mL (ref <5)

## 2022-06-24 LAB — SALICYLATE LEVEL: Salicylate Lvl: <7 mg/dL — ABNORMAL LOW (ref 7.0–30.0)

## 2022-06-24 LAB — ETHANOL: Alcohol, Ethyl (B): <10 mg/dL (ref <10)

## 2022-06-24 LAB — ACETAMINOPHEN LEVEL: Acetaminophen (Tylenol), Serum: <10 ug/mL — ABNORMAL LOW (ref 10–30)

## 2022-06-24 MED ORDER — ONDANSETRON 4 MG PO TBDP
4.0000 mg | ORAL_TABLET | Freq: Once | ORAL | Status: AC
  Administered 2022-06-24: 4 mg via ORAL
  Filled 2022-06-24: qty 1

## 2022-06-24 MED ORDER — HYDROXYZINE HCL 25 MG PO TABS
25.0000 mg | ORAL_TABLET | Freq: Once | ORAL | Status: AC
  Administered 2022-06-24: 25 mg via ORAL
  Filled 2022-06-24: qty 1

## 2022-06-24 MED ORDER — KETOROLAC TROMETHAMINE 60 MG/2ML IM SOLN
15.0000 mg | Freq: Once | INTRAMUSCULAR | Status: AC
  Administered 2022-06-24: 15 mg via INTRAMUSCULAR
  Filled 2022-06-24: qty 2

## 2022-06-24 NOTE — ED Triage Notes (Signed)
Pt reports with suicidal ideations, pt states that her baby daddy just passed away and she is going through some things. Pt reports recently using Fentanyl and states that she is going through withdrawals. Last use 2 days ago. Pt reports recently cutting her upper legs.

## 2022-06-24 NOTE — ED Provider Notes (Signed)
[facility] DEPT Provider Note   CSN: [id number] Arrival date & time: 06/24/22  1839    History  Chief Complaint  Patient presents with   Suicidal    Kelly Hensley is a 26 y.o. female here for evaluation of SI and withdrawal.  Patient states she has had suicidal ideation since her child's father recently passed away.  She has had thoughts of overdosing.  She also feels like she is withdrawing.  She uses fentanyl.  Last use 2 to 3 days ago.  She also states she has been cutting her upper legs, most recent 4 days ago.  She feels aching all over.  Some nausea without vomiting.  Denies chance of pregnancy.  Denies any other illicit substance use.  History of prior BH admissions  HPI     Home Medications Prior to Admission medications   Medication Sig Start Date End Date Taking? Authorizing Provider  hydrOXYzine (ATARAX/VISTARIL) 25 MG tablet Take 1 tablet (25 mg total) by mouth every 6 (six) hours as needed for anxiety. Patient not taking: Reported on 06/24/2022 01/13/21   Lindell Spar I, NP  nicotine (NICODERM CQ - DOSED IN MG/24 HOURS) 14 mg/24hr patch Place 1 patch (14 mg total) onto the skin daily. (May buy from over the counter): For smoking Patient not taking: Reported on 06/24/2022 01/14/21   Lindell Spar I, NP  traZODone (DESYREL) 100 MG tablet Take 1 tablet (100 mg total) by mouth at bedtime as needed for sleep. Patient not taking: Reported on 06/24/2022 01/13/21   Lindell Spar I, NP  norgestimate-ethinyl estradiol (ORTHO-CYCLEN,SPRINTEC,PREVIFEM) 0.25-35 MG-MCG tablet Take 1 tablet by mouth daily. Patient not taking: Reported on 03/27/2020 09/27/18 03/27/20  Manya Silvas, [place]      Allergies    Patient has no known allergies.    Review of Systems   Review of Systems  Constitutional: Negative.   HENT: Negative.    Respiratory: Negative.    Cardiovascular: Negative.   Gastrointestinal:  Positive for nausea. Negative for abdominal distention,  abdominal pain, anal bleeding, blood in stool, constipation, diarrhea, rectal pain and vomiting.  Genitourinary: Negative.   Musculoskeletal:  Positive for myalgias.  Skin: Negative.   Neurological: Negative.   All other systems reviewed and are negative.   Physical Exam Updated Vital Signs BP 105/64   Pulse 78   Temp 98.8 F (37.1 C) (Oral)   Resp 15   SpO2 100%  Physical Exam Vitals and nursing note reviewed.  Constitutional:      General: She is not in acute distress.    Appearance: She is well-developed. She is not ill-appearing, toxic-appearing or diaphoretic.  HENT:     Head: Normocephalic and atraumatic.     Nose: Nose normal.     Mouth/Throat:     Mouth: Mucous membranes are moist.  Eyes:     Pupils: Pupils are equal, round, and reactive to light.  Cardiovascular:     Rate and Rhythm: Normal rate.     Pulses: Normal pulses.     Heart sounds: Normal heart sounds.  Pulmonary:     Effort: Pulmonary effort is normal. No respiratory distress.     Breath sounds: Normal breath sounds.  Abdominal:     General: Bowel sounds are normal. There is no distension.     Palpations: Abdomen is soft.  Musculoskeletal:        General: Normal range of motion.     Cervical back: Normal range of motion.  Skin:  General: Skin is warm and dry.     Capillary Refill: Capillary refill takes less than 2 seconds.     Findings: Lesion present.     Comments: Healing lac, anterior thigh  Neurological:     General: No focal deficit present.     Mental Status: She is alert and oriented to person, place, and time.  Psychiatric:        Mood and Affect: Mood normal.    ED Results / Procedures / Treatments   Labs (all labs ordered are listed, but only abnormal results are displayed) Labs Reviewed  COMPREHENSIVE METABOLIC PANEL - Abnormal; Notable for the following components:      Result Value   Potassium 3.3 (*)    Glucose, Bld 106 (*)    Creatinine, Ser 1.13 (*)    AST 10 (*)     All other components within normal limits  RAPID URINE DRUG SCREEN, HOSP PERFORMED - Abnormal; Notable for the following components:   Benzodiazepines POSITIVE (*)    All other components within normal limits  CBC WITH DIFFERENTIAL/PLATELET - Abnormal; Notable for the following components:   RBC 5.94 (*)    MCV 72.7 (*)    MCH 25.1 (*)    All other components within normal limits  SALICYLATE LEVEL - Abnormal; Notable for the following components:   Salicylate Lvl <7.0 (*)    All other components within normal limits  ACETAMINOPHEN LEVEL - Abnormal; Notable for the following components:   Acetaminophen (Tylenol), Serum <10 (*)    All other components within normal limits  RESP PANEL BY RT-PCR (FLU A&B, COVID) ARPGX2  ETHANOL  I-STAT BETA HCG BLOOD, ED ([facility], [facility], [facility] ONLY)    EKG None  Radiology No results found.  Procedures Procedures    Medications Ordered in ED Medications  ketorolac (TORADOL) injection 15 mg (15 mg Intramuscular Given 06/24/22 2123)  ondansetron (ZOFRAN-ODT) disintegrating tablet 4 mg (4 mg Oral Given 06/24/22 2123)  hydrOXYzine (ATARAX) tablet 25 mg (25 mg Oral Given 06/24/22 2123)    ED Course/ Medical Decision Making/ A&P    26 year old here for evaluation of SI and fentanyl withdrawal.  SI with plan to overdose.  Has been inpatient BH previously.  Had also been cutting.  She has no lacerations that need suturing.  Tolerating p.o. intake however does have some nausea.  We will plan on clearance labs.  Labs personally viewed and interpreted:  COVID neg Preg neg Acetaminophen, ethanol, salicylate WNL UDS positive for benzo CBC without leukocytosis CMP potassium 3.3, creatinine 1.1 tolerating PO intake  Patient reassessed.  Sx improved with meds here. Medically cleared.  Disposition per psychiatry.  The patient has been appropriately medically screened and/or stabilized in the ED. I have low suspicion for any other emergent medical condition which  would require further screening, evaluation or treatment in the ED or require inpatient management.  Patient is hemodynamically stable and in no acute distress.  Patient able to ambulate in department prior to ED.  Evaluation does not show acute pathology that would require ongoing or additional emergent interventions while in the emergency department or further inpatient treatment.  I have discussed the diagnosis with the patient and answered all questions.  Pain is been managed while in the emergency department and patient has no further complaints prior to discharge.  Patient is comfortable with plan discussed in room and is stable for discharge at this time.  I have discussed strict return precautions for returning to the emergency  department.  Patient was encouraged to follow-up with PCP/specialist refer to at discharge.                            Medical Decision Making Amount and/or Complexity of Data Reviewed External Data Reviewed: labs, radiology and notes. Labs: ordered. Decision-making details documented in ED Course.  Risk OTC drugs. Prescription drug management. Decision regarding hospitalization. Diagnosis or treatment significantly limited by social determinants of health.         Final Clinical Impression(s) / ED Diagnoses Final diagnoses:  Suicidal ideation  Substance use    Rx / DC Orders ED Discharge Orders     None         Harold Mattes A, PA-C 06/24/22 2306    Bethann Berkshire, MD 06/26/22 1209

## 2022-06-25 ENCOUNTER — Encounter (HOSPITAL_COMMUNITY): Payer: Self-pay | Admitting: Psychiatry

## 2022-06-25 ENCOUNTER — Inpatient Hospital Stay (HOSPITAL_COMMUNITY)
Admission: AD | Admit: 2022-06-25 | Discharge: 2022-06-30 | DRG: 885 | Disposition: A | Discharge status: Home / Self Care | Payer: Medicaid Other | Source: Intra-hospital | Attending: Psychiatry | Admitting: Psychiatry

## 2022-06-25 DIAGNOSIS — E559 Vitamin D deficiency, unspecified: Secondary | ICD-10-CM | POA: Diagnosis present

## 2022-06-25 DIAGNOSIS — F1123 Opioid dependence with withdrawal: Secondary | ICD-10-CM | POA: Diagnosis not present

## 2022-06-25 DIAGNOSIS — Z5900 Homelessness unspecified: Secondary | ICD-10-CM

## 2022-06-25 DIAGNOSIS — D573 Sickle-cell trait: Secondary | ICD-10-CM | POA: Diagnosis present

## 2022-06-25 DIAGNOSIS — F411 Generalized anxiety disorder: Secondary | ICD-10-CM | POA: Insufficient documentation

## 2022-06-25 DIAGNOSIS — F1120 Opioid dependence, uncomplicated: Secondary | ICD-10-CM

## 2022-06-25 DIAGNOSIS — F17210 Nicotine dependence, cigarettes, uncomplicated: Secondary | ICD-10-CM | POA: Diagnosis present

## 2022-06-25 DIAGNOSIS — Z832 Family history of diseases of the blood and blood-forming organs and certain disorders involving the immune mechanism: Secondary | ICD-10-CM | POA: Diagnosis not present

## 2022-06-25 DIAGNOSIS — F19982 Other psychoactive substance use, unspecified with psychoactive substance-induced sleep disorder: Secondary | ICD-10-CM | POA: Insufficient documentation

## 2022-06-25 DIAGNOSIS — F1190 Opioid use, unspecified, uncomplicated: Secondary | ICD-10-CM | POA: Insufficient documentation

## 2022-06-25 DIAGNOSIS — F1590 Other stimulant use, unspecified, uncomplicated: Secondary | ICD-10-CM | POA: Diagnosis present

## 2022-06-25 DIAGNOSIS — Z62810 Personal history of physical and sexual abuse in childhood: Secondary | ICD-10-CM | POA: Diagnosis not present

## 2022-06-25 DIAGNOSIS — Z833 Family history of diabetes mellitus: Secondary | ICD-10-CM

## 2022-06-25 DIAGNOSIS — Z1152 Encounter for screening for COVID-19: Secondary | ICD-10-CM | POA: Diagnosis not present

## 2022-06-25 DIAGNOSIS — R45851 Suicidal ideations: Secondary | ICD-10-CM | POA: Diagnosis present

## 2022-06-25 DIAGNOSIS — F1193 Opioid use, unspecified with withdrawal: Secondary | ICD-10-CM | POA: Insufficient documentation

## 2022-06-25 DIAGNOSIS — G4700 Insomnia, unspecified: Secondary | ICD-10-CM | POA: Diagnosis present

## 2022-06-25 DIAGNOSIS — Z79899 Other long term (current) drug therapy: Secondary | ICD-10-CM | POA: Diagnosis not present

## 2022-06-25 DIAGNOSIS — F332 Major depressive disorder, recurrent severe without psychotic features: Secondary | ICD-10-CM | POA: Diagnosis present

## 2022-06-25 MED ORDER — ONDANSETRON 4 MG PO TBDP
4.0000 mg | ORAL_TABLET | Freq: Four times a day (QID) | ORAL | Status: DC | PRN
  Administered 2022-06-25 – 2022-06-26 (×2): 4 mg via ORAL
  Filled 2022-06-25 (×2): qty 1

## 2022-06-25 MED ORDER — CLONIDINE HCL 0.1 MG PO TABS
0.1000 mg | ORAL_TABLET | Freq: Four times a day (QID) | ORAL | Status: AC
  Administered 2022-06-25 – 2022-06-28 (×10): 0.1 mg via ORAL
  Filled 2022-06-25 (×12): qty 1

## 2022-06-25 MED ORDER — IBUPROFEN 200 MG PO TABS
600.0000 mg | ORAL_TABLET | Freq: Once | ORAL | Status: AC
  Administered 2022-06-25: 600 mg via ORAL
  Filled 2022-06-25: qty 3

## 2022-06-25 MED ORDER — LOPERAMIDE HCL 2 MG PO CAPS
2.0000 mg | ORAL_CAPSULE | ORAL | Status: DC | PRN
  Administered 2022-06-27: 4 mg via ORAL
  Filled 2022-06-25: qty 2

## 2022-06-25 MED ORDER — METHOCARBAMOL 500 MG PO TABS
500.0000 mg | ORAL_TABLET | Freq: Three times a day (TID) | ORAL | Status: DC | PRN
  Administered 2022-06-25 – 2022-06-29 (×7): 500 mg via ORAL
  Filled 2022-06-25 (×7): qty 1

## 2022-06-25 MED ORDER — MAGNESIUM HYDROXIDE 400 MG/5ML PO SUSP: 30.0000 mL | Freq: Every day | ORAL | Status: DC | PRN

## 2022-06-25 MED ORDER — NAPROXEN 500 MG PO TABS
500.0000 mg | ORAL_TABLET | Freq: Two times a day (BID) | ORAL | Status: DC | PRN
  Administered 2022-06-25 – 2022-06-29 (×8): 500 mg via ORAL
  Filled 2022-06-25 (×8): qty 1

## 2022-06-25 MED ORDER — ONDANSETRON 8 MG PO TBDP
8.0000 mg | ORAL_TABLET | Freq: Once | ORAL | Status: AC
  Administered 2022-06-25: 8 mg via ORAL
  Filled 2022-06-25: qty 1

## 2022-06-25 MED ORDER — IBUPROFEN 200 MG PO TABS
600.0000 mg | ORAL_TABLET | Freq: Four times a day (QID) | ORAL | Status: DC | PRN
  Administered 2022-06-25: 600 mg via ORAL
  Filled 2022-06-25: qty 3

## 2022-06-25 MED ORDER — HYDROXYZINE HCL 25 MG PO TABS
25.0000 mg | ORAL_TABLET | Freq: Four times a day (QID) | ORAL | Status: DC | PRN
  Administered 2022-06-25 – 2022-06-28 (×5): 25 mg via ORAL
  Filled 2022-06-25 (×3): qty 1

## 2022-06-25 MED ORDER — HYDROXYZINE HCL 25 MG PO TABS
25.0000 mg | ORAL_TABLET | Freq: Three times a day (TID) | ORAL | Status: DC | PRN
  Administered 2022-06-25 – 2022-06-26 (×2): 25 mg via ORAL
  Filled 2022-06-25 (×4): qty 1

## 2022-06-25 MED ORDER — WHITE PETROLATUM EX OINT
TOPICAL_OINTMENT | CUTANEOUS | Status: AC
  Filled 2022-06-25: qty 5

## 2022-06-25 MED ORDER — ACETAMINOPHEN 325 MG PO TABS
650.0000 mg | ORAL_TABLET | Freq: Four times a day (QID) | ORAL | Status: DC | PRN
  Administered 2022-06-25: 650 mg via ORAL
  Filled 2022-06-25: qty 2

## 2022-06-25 MED ORDER — DICYCLOMINE HCL 20 MG PO TABS
20.0000 mg | ORAL_TABLET | Freq: Four times a day (QID) | ORAL | Status: DC | PRN
  Administered 2022-06-25 – 2022-06-29 (×6): 20 mg via ORAL
  Filled 2022-06-25 (×6): qty 1

## 2022-06-25 MED ORDER — CLONIDINE HCL 0.1 MG PO TABS
0.1000 mg | ORAL_TABLET | Freq: Every day | ORAL | Status: DC
  Administered 2022-06-30: 0.1 mg via ORAL
  Filled 2022-06-25 (×2): qty 1

## 2022-06-25 MED ORDER — CLONIDINE HCL 0.1 MG PO TABS
0.1000 mg | ORAL_TABLET | ORAL | Status: AC
  Administered 2022-06-28 – 2022-06-29 (×2): 0.1 mg via ORAL
  Filled 2022-06-25 (×5): qty 1

## 2022-06-25 MED ORDER — ALUM & MAG HYDROXIDE-SIMETH 200-200-20 MG/5ML PO SUSP: 30.0000 mL | ORAL | Status: DC | PRN

## 2022-06-25 MED ORDER — TRAZODONE HCL 50 MG PO TABS
50.0000 mg | ORAL_TABLET | Freq: Every evening | ORAL | Status: DC | PRN
  Administered 2022-06-25 – 2022-06-27 (×3): 50 mg via ORAL
  Filled 2022-06-25 (×3): qty 1

## 2022-06-25 MED ORDER — ACETAMINOPHEN 325 MG PO TABS: 650.0000 mg | ORAL_TABLET | Freq: Four times a day (QID) | ORAL | Status: DC | PRN

## 2022-06-25 NOTE — ED Provider Notes (Signed)
Emergency Medicine Observation Re-evaluation Note  Kelly Hensley is a 26 y.o. female, seen on rounds today.  Pt initially presented to the ED for complaints of Suicidal Currently, the patient is sleeping.  Physical Exam  BP 116/67   Pulse 77   Temp 97.9 F (36.6 C) (Oral)   Resp 17   SpO2 100%  Physical Exam General: No acute distress Cardiac: Well-perfused Lungs: Nonlabored Psych: Cooperative  ED Course / MDM  EKG:   I have reviewed the labs performed to date as well as medications administered while in observation.  Recent changes in the last 24 hours include medical evaluation.  Plan  Current plan is for behavioral health evaluation.    Hayden Rasmussen, MD 06/25/22 [phone]

## 2022-06-25 NOTE — Progress Notes (Signed)
   06/25/22 2309  Psych Admission Type (Psych Patients Only)  Admission Status Voluntary  Psychosocial Assessment  Patient Complaints Substance abuse  Eye Contact Fair  Facial Expression Animated  Affect Appropriate to circumstance  Speech Logical/coherent  Interaction Assertive  Motor Activity Other (Comment) (WDL)  Appearance/Hygiene Disheveled  Behavior Characteristics Appropriate to situation  Mood Anxious;Depressed;Pleasant  Thought Process  Coherency WDL  Content WDL  Delusions None reported or observed  Perception WDL  Hallucination None reported or observed  Judgment Impaired  Confusion None  Danger to Self  Current suicidal ideation? Denies  Danger to Others  Danger to Others None reported or observed

## 2022-06-25 NOTE — Progress Notes (Signed)
Pt admitted to [facility] voluntarily from the emergency dept. Patient reports she has been depressed, having suicidal thoughts with a plan to overdose on fentanyl, and abusing fentanyl for the last six months to 1 year. Patient reports she recently lost the father of her children, about 3 months ago, and that she has been dealing with homelessness and recently moved in with her mother. Pt states '' yeah, I've been depressed, suicidal, I lost my baby daddy and I've not been able to eat good and using fentanyl about 0.6 grams daily snorting and my bones feel like they are breaking, I also need something to sleep. '' Patient skin searched and no contraband found. Small abrasion to right lower leg, non draining. Pt oriented to the unit , and ward rules. Pt denies any SI currently or any HI or A/V Hallucinations. Pt cooperative with admission. Po fluids encouraged and provided gatorade. Pt reporting multiple withdrawal symptoms and notified MD for orders, COWS protocol orders placed.  Pt is safe, will con't to monitor.

## 2022-06-25 NOTE — ED Notes (Signed)
Breakfast tray provided. 

## 2022-06-25 NOTE — Tx Team (Signed)
Initial Treatment Plan 06/25/2022 5:09 PM [name] EAV:[id number]    PATIENT STRESSORS: Financial difficulties   Loss of ''childs father''   Substance abuse     PATIENT STRENGTHS: Ability for insight  Marketing executive fund of knowledge    PATIENT IDENTIFIED PROBLEMS:                      DISCHARGE CRITERIA:  Adequate post-discharge living arrangements Improved stabilization in mood, thinking, and/or behavior Need for constant or close observation no longer present Reduction of life-threatening or endangering symptoms to within safe limits Safe-care adequate arrangements made Verbal commitment to aftercare and medication compliance  PRELIMINARY DISCHARGE PLAN: Attend 12-step recovery group Outpatient therapy Return to previous living arrangement  PATIENT/FAMILY INVOLVEMENT: This treatment plan has been presented to and reviewed with the patient, [name], and/or family member, .  The patient and family have been given the opportunity to ask questions and make suggestions.  Leonia Reader, RN 06/25/2022, 5:09 PM
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
## 2022-06-25 NOTE — Plan of Care (Signed)
PT NEWLY ADMITTED, REPORTS GOAL FOR TREATMENT IS TO ''GET BETTER FOR HER KIDS. SHE IS THE ONLY ONE THEY HAVE''

## 2022-06-25 NOTE — Progress Notes (Signed)
Per Quintella Reichert, NP, patient meets criteria for inpatient treatment. There are no available beds at [facility] today. CSW faxed referrals to the following facilities for review:  [facility]  Pending - No Request Sent N/A [address], [place] [address] [phone] --  [facility]  Pending - No Request Sent N/A [address], [place] [phone] --  [facility]  Pending - No Request Sent N/A [address] [place] [address] [phone] --  [place]  Pending - No Request Sent N/A [address] [place] [address] [phone] --  [facility]  Pending - No Request Sent N/A [address] [place] [address] [phone] [facility]  Pending - No Request Sent N/A [address] [place] [address] [phone] --  [facility]  Pending - No Request Sent N/A [address] [place] [address] [phone] --  [place]  Pending - No Request Sent N/A [address], [place] [address] [phone] --  [facility]  Pending - No Request Sent N/A [address] [place] [address] [phone] --  [place]  Pending - No Request Sent N/A [address] [place] [address] [phone] --  [facility]  Pending - No Request Sent N/A [address], [place] [address] [phone] --   TTS will continue to seek bed placement.  Glennie Isle, MSW, [facility] Phone: [phone] Disposition/TOC

## 2022-06-26 ENCOUNTER — Encounter (HOSPITAL_COMMUNITY): Payer: Self-pay

## 2022-06-26 DIAGNOSIS — F411 Generalized anxiety disorder: Secondary | ICD-10-CM

## 2022-06-26 DIAGNOSIS — F1193 Opioid use, unspecified with withdrawal: Secondary | ICD-10-CM

## 2022-06-26 DIAGNOSIS — G4700 Insomnia, unspecified: Secondary | ICD-10-CM | POA: Insufficient documentation

## 2022-06-26 DIAGNOSIS — F19982 Other psychoactive substance use, unspecified with psychoactive substance-induced sleep disorder: Secondary | ICD-10-CM

## 2022-06-26 DIAGNOSIS — F1190 Opioid use, unspecified, uncomplicated: Secondary | ICD-10-CM | POA: Insufficient documentation

## 2022-06-26 HISTORY — DX: Insomnia, unspecified: G47.00

## 2022-06-26 HISTORY — DX: Opioid use, unspecified with withdrawal: F11.93

## 2022-06-26 HISTORY — DX: Other psychoactive substance use, unspecified with psychoactive substance-induced sleep disorder: F19.982

## 2022-06-26 HISTORY — DX: Generalized anxiety disorder: F41.1

## 2022-06-26 LAB — URINALYSIS, COMPLETE (UACMP) WITH MICROSCOPIC
Bacteria, UA: NONE SEEN
Glucose, UA: NEGATIVE mg/dL
Ketones, ur: 20 mg/dL — AB
Leukocytes,Ua: NEGATIVE
Nitrite: NEGATIVE
Protein, ur: 30 mg/dL — AB
RBC / HPF: >50 RBC/hpf — ABNORMAL HIGH (ref 0–5)
Specific Gravity, Urine: 1.034 — ABNORMAL HIGH (ref 1.005–1.030)
pH: 5 (ref 5.0–8.0)

## 2022-06-26 MED ORDER — POTASSIUM CHLORIDE CRYS ER 20 MEQ PO TBCR
40.0000 meq | EXTENDED_RELEASE_TABLET | Freq: Once | ORAL | Status: AC
  Administered 2022-06-26: 40 meq via ORAL
  Filled 2022-06-26: qty 2

## 2022-06-26 MED ORDER — BUPROPION HCL ER (XL) 150 MG PO TB24
150.0000 mg | ORAL_TABLET | Freq: Every day | ORAL | Status: DC
  Administered 2022-06-26 – 2022-06-27 (×2): 150 mg via ORAL
  Filled 2022-06-26 (×4): qty 1

## 2022-06-26 NOTE — Progress Notes (Signed)
Adult Psychoeducational Group Note  Date:  06/26/2022 Time:  9:10 PM  Group Topic/Focus:  AA Group  Participation Level:  Minimal  Participation Quality:  Appropriate  Affect:  Appropriate  Cognitive:  Appropriate  Insight: Limited  Engagement in Group:  None  Modes of Intervention:  Discussion and Support  Additional Comments:   Pt joined the AA Group prior to the close of the group and was attentive the duration of the group.  Wetzel Bjornstad Terrie Grajales 06/26/2022, 9:10 PM

## 2022-06-26 NOTE — Progress Notes (Signed)
D:  Patient denied SI and HI, contracts for safety.  Denied A/V hallucinations.   A:  Medications administered per MD orders.  Emotional support and encouragement given patient. R:  Safety maintained with 15 minute checks.  

## 2022-06-26 NOTE — [facility] Group Notes (Signed)
Spiritual care group on grief and loss facilitated by chaplain Katy Juandaniel Manfredo, [facility]   Group Goal:   Support / Education around grief and loss   Members engage in facilitated group support and psycho-social education.   Group Description:   Following introductions and group rules, group members engaged in facilitated group dialog and support around topic of loss, with particular support around experiences of loss in their lives. Group Identified types of loss (relationships / self / things) and identified patterns, circumstances, and changes that precipitate losses. Reflected on thoughts / feelings around loss, normalized grief responses, and recognized variety in grief experience. Group noted Worden's four tasks of grief in discussion.   Group drew on Adlerian / Rogerian, narrative, MI,   Patient Progress: Did not attend.  
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
## 2022-06-26 NOTE — Group Note (Signed)
LCSW Group Therapy Note  Group Date: 06/26/2022 Start Time: 1300 End Time: 1347   Type of Therapy and Topic:  Group Therapy: Anger Cues and Responses  Participation Level:  Did Not Attend   Description of Group:   In this group, patients learned how to recognize the physical, cognitive, emotional, and behavioral responses they have to anger-provoking situations.  They identified a recent time they became angry and how they reacted.  They analyzed how their reaction was possibly beneficial and how it was possibly unhelpful.  The group discussed a variety of healthier coping skills that could help with such a situation in the future.  Focus was placed on how helpful it is to recognize the underlying emotions to our anger, because working on those can lead to a more permanent solution as well as our ability to focus on the important rather than the urgent.  Therapeutic Goals: Patients will remember their last incident of anger and how they felt emotionally and physically, what their thoughts were at the time, and how they behaved. Patients will identify how their behavior at that time worked for them, as well as how it worked against them. Patients will explore possible new behaviors to use in future anger situations. Patients will learn that anger itself is normal and cannot be eliminated, and that healthier reactions can assist with resolving conflict rather than worsening situations.    [place], LCSW 06/26/2022  3:57 PM

## 2022-06-26 NOTE — BH IP Treatment Plan (Signed)
Interdisciplinary Treatment and Diagnostic Plan Update  06/26/2022 Time of Session: [place] MRN: [id number]  Principal Diagnosis: MDD (major depressive disorder), recurrent episode, severe ([place])  Secondary Diagnoses: Principal Problem:   MDD (major depressive disorder), recurrent episode, severe ([place])   Current Medications:  Current Facility-Administered Medications  Medication Dose Route Frequency Provider Last Rate Last Admin   acetaminophen (TYLENOL) tablet 650 mg  650 mg Oral Q6H PRN Rozetta Nunnery, NP       alum & mag hydroxide-simeth (MAALOX/MYLANTA) 200-200-20 MG/5ML suspension 30 mL  30 mL Oral Q4H PRN Lindon Romp A, NP       cloNIDine (CATAPRES) tablet 0.1 mg  0.1 mg Oral QID Rosezetta Schlatter, MD   0.1 mg at 06/26/22 0900   Followed by   Derrill Memo ON 06/28/2022] cloNIDine (CATAPRES) tablet 0.1 mg  0.1 mg Oral Melven Sartorius, MD       Followed by   Derrill Memo ON 06/30/2022] cloNIDine (CATAPRES) tablet 0.1 mg  0.1 mg Oral QAC breakfast Rosezetta Schlatter, MD       dicyclomine (BENTYL) tablet 20 mg  20 mg Oral Q6H PRN Rosezetta Schlatter, MD   20 mg at 06/26/22 0520   hydrOXYzine (ATARAX) tablet 25 mg  25 mg Oral TID PRN Lindon Romp A, NP   25 mg at 06/26/22 0520   hydrOXYzine (ATARAX) tablet 25 mg  25 mg Oral Q6H PRN Rosezetta Schlatter, MD   25 mg at 06/25/22 1658   loperamide (IMODIUM) capsule 2-4 mg  2-4 mg Oral PRN Rosezetta Schlatter, MD       magnesium hydroxide (MILK OF MAGNESIA) suspension 30 mL  30 mL Oral Daily PRN Lindon Romp A, NP       methocarbamol (ROBAXIN) tablet 500 mg  500 mg Oral Q8H PRN Rosezetta Schlatter, MD   500 mg at 06/26/22 0520   naproxen (NAPROSYN) tablet 500 mg  500 mg Oral BID PRN Rosezetta Schlatter, MD   500 mg at 06/25/22 2113   ondansetron (ZOFRAN-ODT) disintegrating tablet 4 mg  4 mg Oral Q6H PRN Rosezetta Schlatter, MD   4 mg at 06/26/22 0520   potassium chloride SA (KLOR-CON M) CR tablet 40 mEq  40 mEq Oral Once Nicholes Rough, NP       traZODone  (DESYREL) tablet 50 mg  50 mg Oral QHS PRN Rozetta Nunnery, NP   50 mg at 06/25/22 2110   PTA Medications: Medications Prior to Admission  Medication Sig Dispense Refill Last Dose   hydrOXYzine (ATARAX/VISTARIL) 25 MG tablet Take 1 tablet (25 mg total) by mouth every 6 (six) hours as needed for anxiety. (Patient not taking: Reported on 06/24/2022) 75 tablet 0    nicotine (NICODERM CQ - DOSED IN MG/24 HOURS) 14 mg/24hr patch Place 1 patch (14 mg total) onto the skin daily. (May buy from over the counter): For smoking (Patient not taking: Reported on 06/24/2022) 28 patch 0    traZODone (DESYREL) 100 MG tablet Take 1 tablet (100 mg total) by mouth at bedtime as needed for sleep. (Patient not taking: Reported on 06/24/2022) 30 tablet 0     Patient Stressors: Financial difficulties   Loss of ''childs father''   Substance abuse    Patient Strengths: Ability for Systems developer fund of knowledge   Treatment Modalities: Medication Management, Group therapy, Case management,  1 to 1 session with clinician, Psychoeducation, Recreational therapy.   Physician Treatment Plan for Primary Diagnosis: MDD (major depressive disorder), recurrent  episode, severe ([place]) Long Term Goal(s):     Short Term Goals:    Medication Management: Evaluate patient's response, side effects, and tolerance of medication regimen.  Therapeutic Interventions: 1 to 1 sessions, Unit Group sessions and Medication administration.  Evaluation of Outcomes: Progressing  Physician Treatment Plan for Secondary Diagnosis: Principal Problem:   MDD (major depressive disorder), recurrent episode, severe ([place])  Long Term Goal(s):     Short Term Goals:       Medication Management: Evaluate patient's response, side effects, and tolerance of medication regimen.  Therapeutic Interventions: 1 to 1 sessions, Unit Group sessions and Medication administration.  Evaluation of Outcomes: Progressing   RN Treatment  Plan for Primary Diagnosis: MDD (major depressive disorder), recurrent episode, severe ([place]) Long Term Goal(s): Knowledge of disease and therapeutic regimen to maintain health will improve  Short Term Goals: Ability to remain free from injury will improve, Ability to verbalize frustration and anger appropriately will improve, Ability to demonstrate self-control, Ability to participate in decision making will improve, Ability to verbalize feelings will improve, Ability to disclose and discuss suicidal ideas, Ability to identify and develop effective coping behaviors will improve, and Compliance with prescribed medications will improve  Medication Management: RN will administer medications as ordered by provider, will assess and evaluate patient's response and provide education to patient for prescribed medication. RN will report any adverse and/or side effects to prescribing provider.  Therapeutic Interventions: 1 on 1 counseling sessions, Psychoeducation, Medication administration, Evaluate responses to treatment, Monitor vital signs and CBGs as ordered, Perform/monitor CIWA, COWS, AIMS and Fall Risk screenings as ordered, Perform wound care treatments as ordered.  Evaluation of Outcomes: Progressing   LCSW Treatment Plan for Primary Diagnosis: MDD (major depressive disorder), recurrent episode, severe ([place]) Long Term Goal(s): Safe transition to appropriate next level of care at discharge, Engage patient in therapeutic group addressing interpersonal concerns.  Short Term Goals: Engage patient in aftercare planning with referrals and resources, Increase social support, Increase ability to appropriately verbalize feelings, Increase emotional regulation, Facilitate acceptance of mental health diagnosis and concerns, Facilitate patient progression through stages of change regarding substance use diagnoses and concerns, Identify triggers associated with mental health/substance abuse issues, and Increase  skills for wellness and recovery  Therapeutic Interventions: Assess for all discharge needs, 1 to 1 time with Social worker, Explore available resources and support systems, Assess for adequacy in community support network, Educate family and significant other(s) on suicide prevention, Complete Psychosocial Assessment, Interpersonal group therapy.  Evaluation of Outcomes: Progressing   Progress in Treatment: Attending groups: No. Participating in groups: No. Taking medication as prescribed: Yes. Toleration medication: Yes. Family/Significant other contact made: No, will contact:  CSW will obtain consent to reach family/friend.  Patient understands diagnosis: Yes. Discussing patient identified problems/goals with staff: Yes. Medical problems stabilized or resolved: Yes. Denies suicidal/homicidal ideation: No. Issues/concerns per patient self-inventory: Yes. Other: none  New problem(s) identified: No, Describe:  none  New Short Term/Long Term Goal(s): Patient to work towards detox, medication management for mood stabilization; elimination of SI thoughts; development of comprehensive mental wellness/sobriety plan.  Patient Goals:  Patient states their goal for treatment is to "I lost my ID and social security card . . . Need help with housing."   Discharge Plan or Barriers: Patient is homeless, CSW to have further conversations about housing resources after discharge.   Reason for Continuation of Hospitalization: Depression  Estimated Length of Stay: 1-7 days   Last 3 [place] Suicide Severity Risk Score: Flowsheet  Row Admission (Current) from 06/25/2022 in [place] [facility] ED from 06/24/2022 in [place] DEPT Admission (Discharged) from 01/10/2021 in [place] [facility]  C-SSRS RISK CATEGORY High Risk High Risk Moderate Risk      Scribe for Treatment Team: Larose Kells 06/26/2022 11:21  AM

## 2022-06-26 NOTE — [facility] Suicide Risk Assessment (Signed)
Suicide Risk Assessment  Admission Assessment    [facility] Admission Suicide Risk Assessment   Nursing information obtained from:  Patient, Review of record Demographic factors:  Low socioeconomic status Current Mental Status:  Suicidal ideation indicated by patient, Suicidal ideation indicated by others, Self-harm behaviors, Self-harm thoughts Loss Factors:  Loss of significant relationship Historical Factors:  Impulsivity Risk Reduction Factors:  Responsible for children under 25 years of age  Total Time spent with patient: 1.5 hours Principal Problem: MDD (major depressive disorder), recurrent episode, severe ([place]) Diagnosis:  Principal Problem:   MDD (major depressive disorder), recurrent episode, severe ([place]) Active Problems:   Insomnia   Opioid use disorder   Anxiety state  HPI: Kelly Hensley is a 26 yo [place] American female who presented to the [facility] on 10/07 with complaints of worsening depressive symptoms and suicidal ideations with a plan to overdose on Fentanyl in the context of financial stressors, homelessness, Fentanyl abuse & grieving the recent death of her boyfriend.  Patient was transferred voluntarily to this [facility] for treatment and stabilization of her mood.   Continued Clinical Symptoms:  Patient reports worsening insomnia, anhedonia, trouble concentrating, poor appetite, decreased energy levels, anxiety, along with feelings of hopelessness worthlessness and helplessness. Pt also reports recurrent thoughts of death. These symptoms render ;pt at a high risk of suicide, and continuous hospitalization is required to treat and stabilize her symptoms.   The "Alcohol Use Disorders Identification Test", Guidelines for Use in Primary Care, Second Edition.  World Pharmacologist [facility]). Score between 0-7:  no or low risk or alcohol related problems. Score between 8-15:  moderate risk of alcohol related problems. Score between 16-19:  high risk of alcohol related  problems. Score 20 or above:  warrants further diagnostic evaluation for alcohol dependence and treatment.  CLINICAL FACTORS:   Depression:   Anhedonia Hopelessness Insomnia Recent sense of peace/wellbeing Severe More than one psychiatric diagnosis  Musculoskeletal: Strength & Muscle Tone: within normal limits Gait & Station: normal Patient leans: N/A  Psychiatric Specialty Exam:  Presentation  General Appearance:  Appropriate for Environment  Eye Contact: Fair  Speech: Clear and Coherent  Speech Volume: Normal  Handedness: Right   Mood and Affect  Mood: Depressed  Affect: Congruent   Thought Process  Thought Processes: Coherent  Descriptions of Associations:Intact  Orientation:Full (Time, Place and Person)  Thought Content:Logical  History of Schizophrenia/Schizoaffective disorder:No data recorded Duration of Psychotic Symptoms:No data recorded Hallucinations:Hallucinations: None  Ideas of Reference:None  Suicidal Thoughts:Suicidal Thoughts: No  Homicidal Thoughts:Homicidal Thoughts: No   Sensorium  Memory: Immediate Good  Judgment: Fair  Insight: Fair   Community education officer  Concentration: Fair  Attention Span: Fair  Recall: [employer] of Knowledge: Fair  Language: Fair  Psychomotor Activity  Psychomotor Activity: Psychomotor Activity: Normal  Assets  Assets: Communication Skills  Sleep  Sleep: Sleep: Poor  Physical Exam: Physical Exam Constitutional:      Appearance: Normal appearance.  Eyes:     Pupils: Pupils are equal, round, and reactive to light.  Musculoskeletal:     Cervical back: Normal range of motion.  Neurological:     Mental Status: She is alert and oriented to person, place, and time.    Review of Systems  Constitutional: Negative.   HENT: Negative.    Eyes: Negative.   Respiratory: Negative.    Cardiovascular: Negative.   Gastrointestinal: Negative.   Genitourinary: Negative.    Musculoskeletal: Negative.   Skin: Negative.   Neurological: Negative.  Psychiatric/Behavioral:  Positive for depression and substance abuse. Negative for hallucinations, memory loss and suicidal ideas. The patient is nervous/anxious and has insomnia.    Blood pressure 111/65, pulse 83, temperature 99.1 F (37.3 C), temperature source Oral, resp. rate 17, height 5\' 9"  (1.753 m), weight 93.4 kg, SpO2 100 %. Body mass index is 30.42 kg/m.  COGNITIVE FEATURES THAT CONTRIBUTE TO RISK:  None    SUICIDE RISK:   Moderate:  Frequent suicidal ideation with limited intensity, and duration, some specificity in terms of plans, no associated intent, good self-control, limited dysphoria/symptomatology, some risk factors present, and identifiable protective factors, including available and accessible social support. PLAN Safety and Monitoring: Voluntary admission to inpatient psychiatric unit for safety, stabilization and treatment Daily contact with patient to assess and evaluate symptoms and progress in treatment Patient's case to be discussed in multi-disciplinary team meeting Observation Level : q15 minute checks Vital signs: q12 hours Precautions: Safety   Long Term Goal(s): Improvement in symptoms so as ready for discharge   Short Term Goals: Ability to identify changes in lifestyle to reduce recurrence of condition will improve, Ability to disclose and discuss suicidal ideas, Ability to demonstrate self-control will improve, Ability to identify and develop effective coping behaviors will improve, Ability to maintain clinical measurements within normal limits will improve, Compliance with prescribed medications will improve, and Ability to identify triggers associated with substance abuse/mental health issues will improve   Diagnoses:  Principal Problem:   MDD (major depressive disorder), recurrent episode, severe ([place]) Active Problems:   Insomnia   Opioid use disorder   Anxiety state    Medications MDD -Start Wellbutrin XL 150 mg daily for depressive symptoms   Anxiety -Start Hydroxyzine 25 mg every 6 hours PRN   Insomnia -Start Trazodone 50 mg nightly PRN   Opoid use disorder -Continue Clonidine detox protocol   Other PRNS -Continue Tylenol 650 mg every 6 hours PRN for mild pain -Continue Maalox 30 mg every 4 hrs PRN for indigestion -Continue Imodium 2-4 mg as needed for diarrhea -Continue Milk of Magnesia as needed every 6 hrs for constipation -Continue Zofran disintegrating tabs every 6 hrs PRN for nausea    Discharge Planning: Social work and case management to assist with discharge planning and identification of hospital follow-up needs prior to discharge Estimated LOS: 5-7 days Discharge Concerns: Need to establish a safety plan; Medication compliance and effectiveness Discharge Goals: Return home with outpatient referrals for mental health follow-up including medication management/psychotherapy  I certify that inpatient services furnished can reasonably be expected to improve the patient's condition.   Nicholes Rough, NP 06/26/2022, 5:21 PM

## 2022-06-26 NOTE — Plan of Care (Signed)
Nurse discussed anxiety, depression and coping skills with patient.  

## 2022-06-26 NOTE — Progress Notes (Signed)
Recreation Therapy Notes  Date: 10.9.23 Time: 0930 Location: [address] Dayroom  Group Topic: Stress Management   Goal Area(s) Addresses:  Patient will actively participate in stress management techniques presented during session.  Patient will successfully identify benefit of practicing stress management post d/c.   Intervention: Relaxation exercise with ambient sound and script   Activity: Guided Imagery. LRT provided education, instruction, and demonstration on practice of visualization via guided imagery. Patient was asked to participate in the technique introduced during session. LRT debriefed including topics of mindfulness, stress management and specific scenarios each patient could use these techniques. Patients were given suggestions of ways to access scripts post d/c and encouraged to explore Youtube and other apps available on smartphones, tablets, and computers.  Education:  Stress Management, Discharge Planning.   Education Outcome: Acknowledges education  Clinical Observations/Feedback: Patient did not attend group.    Jeslie Lowe-McCall, LRT,CTRS        Brennden Masten A Aryonna Gunnerson-McCall 06/26/2022 12:28 PM

## 2022-06-27 LAB — TSH: TSH: 1.304 u[IU]/mL (ref 0.350–4.500)

## 2022-06-27 LAB — VITAMIN D 25 HYDROXY (VIT D DEFICIENCY, FRACTURES): Vit D, 25-Hydroxy: 14.99 ng/mL — ABNORMAL LOW (ref 30–100)

## 2022-06-27 LAB — LIPID PANEL
Cholesterol: 140 mg/dL (ref 0–200)
HDL: 34 mg/dL — ABNORMAL LOW (ref >40)
LDL Cholesterol: 84 mg/dL (ref 0–99)
Total CHOL/HDL Ratio: 4.1 RATIO
Triglycerides: 108 mg/dL (ref <150)
VLDL: 22 mg/dL (ref 0–40)

## 2022-06-27 LAB — VITAMIN B12: Vitamin B-12: 337 pg/mL (ref 180–914)

## 2022-06-27 LAB — HEMOGLOBIN A1C
Hgb A1c MFr Bld: 4.8 % (ref 4.8–5.6)
Mean Plasma Glucose: 91.06 mg/dL

## 2022-06-27 MED ORDER — VITAMIN D3 25 MCG PO TABS
1000.0000 [IU] | ORAL_TABLET | Freq: Every day | ORAL | Status: DC
  Administered 2022-06-27 – 2022-06-30 (×4): 1000 [IU] via ORAL
  Filled 2022-06-27 (×6): qty 1

## 2022-06-27 MED ORDER — ESCITALOPRAM OXALATE 5 MG PO TABS
5.0000 mg | ORAL_TABLET | Freq: Every day | ORAL | Status: AC
  Administered 2022-06-28: 5 mg via ORAL
  Filled 2022-06-27 (×2): qty 1

## 2022-06-27 MED ORDER — ESCITALOPRAM OXALATE 10 MG PO TABS
10.0000 mg | ORAL_TABLET | Freq: Every day | ORAL | Status: DC
  Administered 2022-06-29: 10 mg via ORAL
  Filled 2022-06-27 (×3): qty 1

## 2022-06-27 MED ORDER — NICOTINE POLACRILEX 2 MG MT GUM: 2.0000 mg | CHEWING_GUM | OROMUCOSAL | Status: DC | PRN

## 2022-06-27 NOTE — Progress Notes (Signed)
[facility] MD Progress Note  06/27/2022 12:53 PM Kelly Hensley  MRN:  [id number] Principal Problem: MDD (major depressive disorder), recurrent severe, without psychosis ([place]) Diagnosis: Principal Problem:   MDD (major depressive disorder), recurrent severe, without psychosis ([place]) Active Problems:   Insomnia   Opioid use disorder   Anxiety state  HPI: Kelly Hensley is a 26 yo [place] American female who presented to the [facility] on 10/07 with complaints of worsening depressive symptoms and suicidal ideations with a plan to overdose on Fentanyl in the context of financial stressors, homelessness, Fentanyl abuse & grieving the recent death of her boyfriend.  Patient was transferred voluntarily to this [facility] for treatment and stabilization of her mood.   24 hour chart review: Pt's chart reviewed, case discussed with treatment team. Pt presents today with a euthymic affect and mood is congruent. Her attention to personal hygiene and grooming is fair, eye contact is good, speech is clear & coherent. Thought contents are organized and logical, and pt currently denies SI/HI/AVH or paranoia. There is no evidence of delusional thoughts. Pt reports that her sleep quality last night was fair, and her appetite is good, but she has not been eating well due to concerns of frequent bowel movements. Fluids are being encouraged. She denies any cravings for opoid.  Pt is however reporting GI symptoms related to opioid withdrawal. She reports having 2 bouts of diarrhea earlier today morning, and Imodium is being given to her as needed. She is also reporting generalized body aches and malaise, and reports that the Robaxin & Naproxen are helpful. We are stopping Wellbutrin XL as this might not be effective in management of pt's depressive symptoms in the setting of anxiety and an Opioid use d/o. Pt reports that her anxiety today is 7 (10 being worst). She reports her depressive symptoms as 7 (10 being worst).  We are starting  Lexapro 5 mg daily starting tomorrow (10/11) for depressive symptoms and anxiety. We are continuing medications as listed below.  We will continue to follow.  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    Migraines    Morbid obesity ([place])    Sickle cell trait ([place])     Past Surgical History:  Procedure Laterality Date   ABCESS DRAINAGE Left 2014   Family History:  Family History  Problem Relation Age of Onset   Diabetes Mother    Sickle cell anemia Father    Family Psychiatric  History: none reported Social History:  Social History   Substance and Sexual Activity  Alcohol Use Yes   Alcohol/week: 6.0 standard drinks of alcohol   Types: 3 Shots of liquor, 3 Glasses of wine per week   Comment: 3 vodkas weekly; wine 3 x week     Social History   Substance and Sexual Activity  Drug Use Yes   Frequency: 7.0 times per week   Types: Marijuana, Heroin, Fentanyl   Comment: daily use - ''.6 grams fentanyl snorting''    Social History   Socioeconomic History   Marital status: Single    Spouse name: Not on file   Number of children: 2   Years of education: 12   Highest  education level: High school graduate  Occupational History   Not on file  Tobacco Use   Smoking status: Every Day    Packs/day: 1.00    Years: 10.00    Total pack years: 10.00    Types: Cigarettes    Last attempt to quit: 02/17/2015    Years since quitting: 7.3   Smokeless tobacco: Never  Vaping Use   Vaping Use: Never used  Substance and Sexual Activity   Alcohol use: Yes    Alcohol/week: 6.0 standard drinks of alcohol    Types: 3 Shots of liquor, 3 Glasses of wine per week    Comment: 3 vodkas weekly; wine 3 x week   Drug use: Yes    Frequency: 7.0  times per week    Types: Marijuana, Heroin, Fentanyl    Comment: daily use - ''.6 grams fentanyl snorting''   Sexual activity: Yes    Birth control/protection: Implant  Other Topics Concern   Not on file  Social History Narrative   Not on file   Social Determinants of Health   Financial Resource Strain: Not on file  Food Insecurity: No Food Insecurity (06/25/2022)   Hunger Vital Sign    Worried About Running Out of Food in the Last Year: Never true    Ran Out of Food in the Last Year: Never true  Transportation Needs: Unmet Transportation Needs (06/25/2022)   PRAPARE - Administrator, Civil Service (Medical): Yes    Lack of Transportation (Non-Medical): Yes  Physical Activity: Not on file  Stress: Not on file  Social Connections: Not on file   Additional Social History:   Sleep: Fair  Appetite:  Good  Current Medications: Current Facility-Administered Medications  Medication Dose Route Frequency Provider Last Rate Last Admin   acetaminophen (TYLENOL) tablet 650 mg  650 mg Oral Q6H PRN Jackelyn Poling, NP       alum & mag hydroxide-simeth (MAALOX/MYLANTA) 200-200-20 MG/5ML suspension 30 mL  30 mL Oral Q4H PRN Nira Conn A, NP       cloNIDine (CATAPRES) tablet 0.1 mg  0.1 mg Oral QID Lamar Sprinkles, MD   0.1 mg at 06/27/22 0856   Followed by   Melene Muller ON 06/28/2022] cloNIDine (CATAPRES) tablet 0.1 mg  0.1 mg Oral Alvy Beal, MD       Followed by   Melene Muller ON 06/30/2022] cloNIDine (CATAPRES) tablet 0.1 mg  0.1 mg Oral QAC breakfast Lamar Sprinkles, MD       dicyclomine (BENTYL) tablet 20 mg  20 mg Oral Q6H PRN Lamar Sprinkles, MD   20 mg at 06/26/22 1206   [START ON 06/28/2022] escitalopram (LEXAPRO) tablet 5 mg  5 mg Oral Daily Massengill, Nathan, MD       Followed by   Melene Muller ON 06/29/2022] escitalopram (LEXAPRO) tablet 10 mg  10 mg Oral Daily Massengill, Nathan, MD       hydrOXYzine (ATARAX) tablet 25 mg  25 mg Oral TID PRN Nira Conn A, NP   25 mg  at 06/26/22 0520   hydrOXYzine (ATARAX) tablet 25 mg  25 mg Oral Q6H PRN Lamar Sprinkles, MD   25 mg at 06/26/22 2127   loperamide (IMODIUM) capsule 2-4 mg  2-4 mg Oral PRN Lamar Sprinkles, MD   4 mg at 06/27/22 0858   magnesium hydroxide (MILK OF MAGNESIA) suspension 30 mL  30 mL Oral Daily PRN Nira Conn A, NP       methocarbamol (ROBAXIN) tablet 500 mg  500 mg Oral Q8H PRN Lamar Sprinkles, MD   500 mg at 06/26/22 2128   naproxen (NAPROSYN) tablet 500 mg  500 mg Oral BID PRN Lamar Sprinkles, MD   500 mg at 06/27/22 8416   nicotine polacrilex (NICORETTE) gum 2 mg  2 mg Oral PRN Starleen Blue, NP       ondansetron (ZOFRAN-ODT) disintegrating tablet 4 mg  4 mg Oral Q6H PRN Lamar Sprinkles, MD   4 mg at 06/26/22 0520   traZODone (DESYREL) tablet 50 mg  50 mg Oral QHS PRN Jackelyn Poling, NP   50 mg at 06/26/22 2127   vitamin D3 (CHOLECALCIFEROL) tablet 1,000 Units  1,000 Units Oral Daily Massengill, Harrold Donath, MD        Lab Results:  Results for orders placed or performed during the hospital encounter of 06/25/22 (from the past 48 hour(s))  Urinalysis, Complete w Microscopic     Status: Abnormal   Collection Time: 06/26/22  5:57 PM  Result Value Ref Range   Color, Urine YELLOW YELLOW   APPearance HAZY (A) CLEAR   Specific Gravity, Urine 1.034 (H) 1.005 - 1.030   pH 5.0 5.0 - 8.0   Glucose, UA NEGATIVE NEGATIVE mg/dL   Hgb urine dipstick MODERATE (A) NEGATIVE   Bilirubin Urine SMALL (A) NEGATIVE   Ketones, ur 20 (A) NEGATIVE mg/dL   Protein, ur 30 (A) NEGATIVE mg/dL   Nitrite NEGATIVE NEGATIVE   Leukocytes,Ua NEGATIVE NEGATIVE   RBC / HPF >50 (H) 0 - 5 RBC/hpf   WBC, UA 0-5 0 - 5 WBC/hpf   Bacteria, UA NONE SEEN NONE SEEN   Squamous Epithelial / LPF 6-10 0 - 5   Mucus PRESENT    Hyaline Casts, UA PRESENT     Comment: Performed at [facility], [address] [place], [place] [address]  Hemoglobin A1c     Status: None   Collection Time: 06/27/22  6:53 AM  Result  Value Ref Range   Hgb A1c MFr Bld 4.8 4.8 - 5.6 %    Comment: (NOTE) Pre diabetes:          5.7%-6.4%  Diabetes:              >6.4%  Glycemic control for   <7.0% adults with diabetes    Mean Plasma Glucose 91.06 mg/dL    Comment: Performed at [facility], [address], [place], [place] [address]  Lipid panel     Status: Abnormal   Collection Time: 06/27/22  6:53 AM  Result Value Ref Range   Cholesterol 140 0 - 200 mg/dL   Triglycerides 932 <355 mg/dL   HDL 34 (L) >73 mg/dL   Total CHOL/HDL Ratio 4.1 RATIO   VLDL 22 0 - 40 mg/dL   LDL Cholesterol 84 0 - 99 mg/dL    Comment:        Total Cholesterol/HDL:CHD Risk Coronary Heart Disease Risk Table                     Men   Women  1/2 Average Risk   3.4   3.3  Average Risk       5.0   4.4  2 X Average Risk   9.6   7.1  3 X Average Risk  23.4   11.0        Use the calculated Patient Ratio above and the CHD Risk Table to determine the patient's CHD Risk.  ATP III CLASSIFICATION (LDL):  <100     mg/dL   Optimal  283-151  mg/dL   Near or Above                    Optimal  130-159  mg/dL   Borderline  761-607  mg/dL   High  >371     mg/dL   Very High Performed at [facility], [address] [place], [place] [address]   TSH     Status: None   Collection Time: 06/27/22  6:53 AM  Result Value Ref Range   TSH 1.304 0.350 - 4.500 uIU/mL    Comment: Performed by a 3rd Generation assay with a functional sensitivity of <=0.01 uIU/mL. Performed at [facility], [address], [place], [place] [address]   VITAMIN D 25 Hydroxy (Vit-D Deficiency, Fractures)     Status: Abnormal   Collection Time: 06/27/22  6:53 AM  Result Value Ref Range   Vit D, 25-Hydroxy 14.99 (L) 30 - 100 ng/mL    Comment: (NOTE) Vitamin D deficiency has been defined by the Institute of Medicine  and an Endocrine Society practice guideline as a level of serum 25-OH  vitamin D less than 20 ng/mL (1,2). The  Endocrine Society went on to  further define vitamin D insufficiency as a level between 21 and 29  ng/mL (2).  1. IOM (Institute of Medicine). 2010. Dietary reference intakes for  calcium and D. [place]: [employer]. 2. Holick MF, Binkley [place], Bischoff-Ferrari HA, et al. Evaluation,  treatment, and prevention of vitamin D deficiency: an Endocrine  Society clinical practice guideline, JCEM. 2011 Jul; 96(7): 1911-30.  Performed at [facility], [address], [place], [place] [address]   Vitamin B12     Status: None   Collection Time: 06/27/22  6:53 AM  Result Value Ref Range   Vitamin B-12 337 180 - 914 pg/mL    Comment: (NOTE) This assay is not validated for testing neonatal or myeloproliferative syndrome specimens for Vitamin B12 levels. Performed at [facility], [address], [place], [place] [address]     Blood Alcohol level:  Lab Results  Component Value Date   ETH <10 06/24/2022   ETH <10 01/08/2021    Metabolic Disorder Labs: Lab Results  Component Value Date   HGBA1C 4.8 06/27/2022   MPG 91.06 06/27/2022   MPG 82.45 01/11/2021   No results found for: "PROLACTIN" Lab Results  Component Value Date   CHOL 140 06/27/2022   TRIG 108 06/27/2022   HDL 34 (L) 06/27/2022   CHOLHDL 4.1 06/27/2022   VLDL 22 06/27/2022   LDLCALC 84 06/27/2022   LDLCALC 82 01/11/2021   Physical Findings: AIMS: 0  CIWA: 0    COWS:  COWS Total Score: 6  Musculoskeletal: Strength & Muscle Tone: within normal limits Gait & Station: normal Patient leans: N/A  Psychiatric Specialty Exam:  Presentation  General Appearance:  Appropriate for Environment; Fairly Groomed  Eye Contact: Fair  Speech: Clear and Coherent  Speech Volume: Normal  Handedness: Right  Mood and Affect  Mood: Euthymic  Affect: Congruent  Thought Process  Thought Processes: Coherent  Descriptions of Associations:Intact  Orientation:Full  (Time, Place and Person)  Thought Content:Logical  History of Schizophrenia/Schizoaffective disorder:No data recorded Duration of Psychotic Symptoms:No data recorded Hallucinations:Hallucinations: None  Ideas of Reference:None  Suicidal Thoughts:Suicidal Thoughts: No  Homicidal Thoughts:Homicidal Thoughts: No Sensorium  Memory: Immediate  Good  Judgment: Poor  Insight: Poor   Executive Functions  Concentration: Fair  Attention Span: Fair  Recall: [employer] of Knowledge: Fair  Language: Fair Psychomotor Activity  Psychomotor Activity: Psychomotor Activity: Normal  Assets  Assets: Manufacturing systems engineer; Resilience  Sleep  Sleep: Sleep: Fair  Physical Exam: Physical Exam Review of Systems  Constitutional: Negative.  Negative for fever.  HENT: Negative.  Negative for sore throat.   Eyes: Negative.   Respiratory: Negative.    Cardiovascular: Negative.  Negative for chest pain.  Gastrointestinal:  Positive for diarrhea. Negative for nausea.  Genitourinary: Negative.   Musculoskeletal:  Positive for joint pain and myalgias.  Skin: Negative.   Neurological: Negative.   Psychiatric/Behavioral:  Positive for depression and substance abuse. Negative for hallucinations, memory loss and suicidal ideas. The patient is nervous/anxious and has insomnia.    Blood pressure 124/70, pulse 76, temperature 98.6 F (37 C), temperature source Oral, resp. rate 17, height 5\' 9"  (1.753 m), weight 93.4 kg, SpO2 100 %. Body mass index is 30.42 kg/m.  Treatment Plan Summary: Daily contact with patient to assess and evaluate symptoms and progress in treatment and Medication management   Observation Level/Precautions:  15 minute checks  Laboratory:  Labs reviewed   Psychotherapy:  Unit Group sessions  Medications:  See [facility]  Consultations:  To be determined   Discharge Concerns:  Safety, medication compliance, mood stability  Estimated LOS: 5-7 days  Other:  N/A     PLAN Safety and Monitoring: Voluntary admission to inpatient psychiatric unit for safety, stabilization and treatment Daily contact with patient to assess and evaluate symptoms and progress in treatment Patient's case to be discussed in multi-disciplinary team meeting Observation Level : q15 minute checks Vital signs: q12 hours Precautions: Safety   Long Term Goal(s): Improvement in symptoms so as ready for discharge   Short Term Goals: Ability to identify changes in lifestyle to reduce recurrence of condition will improve, Ability to disclose and discuss suicidal ideas, Ability to demonstrate self-control will improve, Ability to identify and develop effective coping behaviors will improve, Ability to maintain clinical measurements within normal limits will improve, Compliance with prescribed medications will improve, and Ability to identify triggers associated with substance abuse/mental health issues will improve   Diagnoses:  Principal Problem:   MDD (major depressive disorder), recurrent episode, severe (H[facility]) Active Problems:   Insomnia   Opioid use disorder   Anxiety state   Medications MDD -Discontinue Wellbutrin XL 150 mg as this might not be effective in management of depressive symptoms in the context of Opioid use d/o and anxiety -Start Lexapro 5 mg tomorrow (10/5, and increase to 10 mg daily on 10/6)   Anxiety -Continue Hydroxyzine 25 mg every 6 hours PRN   Insomnia -Continue Trazodone 50 mg nightly PRN   Opoid use disorder -Continue Clonidine detox protocol -Continue Robaxin 500 mg PRN Q 8 H for body aches -Continue Naproxen 500 mg BID PRN for pain  Vitamin D deficiency -Start Vitamin D3 1000 units daily   Other PRNS -Continue Tylenol 650 mg every 6 hours PRN for mild pain -Continue Maalox 30 mg every 4 hrs PRN for indigestion -Continue Imodium 2-4 mg as needed for diarrhea -Continue Milk of Magnesia as needed every 6 hrs for constipation -Continue Zofran  disintegrating tabs every 6 hrs PRN for nausea  -Start Nicorette gum PRN for nicotine addiction   Discharge Planning: Social work and case management to assist with discharge planning and identification of hospital follow-up  needs prior to discharge Estimated LOS: 5-7 days Discharge Concerns: Need to establish a safety plan; Medication compliance and effectiveness Discharge Goals: Return home with outpatient referrals for mental health follow-up including medication management/psychotherapy   Starleen Blueoris  Dakoda Bassette, NP 06/27/2022, 12:53 PM

## 2022-06-27 NOTE — Plan of Care (Signed)
  Problem: Education: Goal: Knowledge of the prescribed therapeutic regimen will improve Outcome: Progressing   Problem: Health Behavior/Discharge Planning: Goal: Compliance with therapeutic regimen will improve Outcome: Progressing   Problem: Safety: Goal: Ability to remain free from injury will improve Outcome: Progressing

## 2022-06-27 NOTE — Progress Notes (Signed)
Pt rested in bed often. Pt did attend some scheduled activities. Pt refused 12pm clonidine noting somnolence. Pt complained of loose stool, body aches, and abdominal discomfort, as well as anxiety. Pt utilized prn naproxen and imodium. Pt states this is her 4th day without fentanyl. Pt declined atarax when offered for anxiety. Q 15 minute checks ongoing for safety.

## 2022-06-27 NOTE — [facility] Suicide Risk Assessment (Signed)
[facility] INPATIENT:  Family/Significant Other Suicide Prevention Education  Suicide Prevention Education:  Education Completed;   Building services engineer (mother) has been identified by the patient as the family member/significant other with whom the patient will be residing, and identified as the person(s) who will aid the patient in the event of a mental health crisis (suicidal ideations/suicide attempt).  With written consent from the patient, the family member/significant other has been provided the following suicide prevention education, prior to the and/or following the discharge of the patient.  The suicide prevention education provided includes the following: Suicide risk factors Suicide prevention and interventions National Suicide Hotline telephone number [facility] assessment telephone number [facility] Emergency Assistance [place] and/or Residential Mobile Crisis Unit telephone number  Request made of family/significant other to: Remove weapons (e.g., guns, rifles, knives), all items previously/currently identified as safety concern.   Remove drugs/medications (over-the-counter, prescriptions, illicit drugs), all items previously/currently identified as a safety concern.  The family member/significant other verbalizes understanding of the suicide prevention education information provided.  The family member/significant other agrees to remove the items of safety concern listed above.  [place] MSW, LCSW 06/27/2022, 1:39 PM

## 2022-06-27 NOTE — Progress Notes (Signed)
The patient was unable to rate her day but did indicate that she felt better physically. Her positive event for the day is that she went outside for fresh air.

## 2022-06-27 NOTE — Progress Notes (Signed)
   06/27/22 0500  Sleep  Number of Hours 6    
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
## 2022-06-27 NOTE — Progress Notes (Signed)
D- Patient alert and oriented. Denies SI, HI, AVH, and endorses a 7/10 pain in her back and legs. Patient reports "stomachache" but was unable to provide more details.  A- Scheduled medications administered to patient, per MD orders. Patient encouraged to consume fluids and foods to increase BP. Support and encouragement provided.  Routine safety checks conducted every 15 minutes.  Patient informed to notify staff with problems or concerns.  R- No adverse drug reactions noted. Patient contracts for safety at this time. Patient compliant with medications and treatment plan. Patient receptive, calm, and cooperative. Patient interacts well with others on the unit.  Patient remains safe at this time.

## 2022-06-28 MED ORDER — TRAZODONE HCL 100 MG PO TABS
100.0000 mg | ORAL_TABLET | Freq: Every day | ORAL | Status: DC
  Administered 2022-06-28 – 2022-06-29 (×2): 100 mg via ORAL
  Filled 2022-06-28 (×5): qty 1

## 2022-06-28 MED ORDER — TRAZODONE HCL 50 MG PO TABS: 50.0000 mg | ORAL_TABLET | Freq: Every evening | ORAL | Status: DC | PRN

## 2022-06-28 NOTE — [facility] Group Notes (Signed)
PsychoEducational Group. Patients were given education on the power of positive re-framing. Patients were given interactive exercise in which they were asked to use positive reframing in ordinary situations.  Patients were then read poem of the power of thoughts by the Dali Lama and asked to reflect. Pt did not attend. 

## 2022-06-28 NOTE — Group Note (Unsigned)
[facility] LCSW Group Therapy Note   Group Date: 06/28/2022 Start Time: 1300 End Time: 1400   Type of Therapy/Topic:  Group Therapy:  Balance in Life  Participation Level:  {[facility] PARTICIPATION LEVEL:22264}   Description of Group:    This group will address the concept of balance and how it feels and looks when one is unbalanced. Patients will be encouraged to process areas in their lives that are out of balance, and identify reasons for remaining unbalanced. Facilitators will guide patients utilizing problem- solving interventions to address and correct the stressor making their life unbalanced. Understanding and applying boundaries will be explored and addressed for obtaining  and maintaining a balanced life. Patients will be encouraged to explore ways to assertively make their unbalanced needs known to significant others in their lives, using other group members and facilitator for support and feedback.  Therapeutic Goals: 1. Patient will identify two or more emotions or situations they have that consume much of in their lives. 2. Patient will identify signs/triggers that life has become out of balance:  3. Patient will identify two ways to set boundaries in order to achieve balance in their lives:  4. Patient will demonstrate ability to communicate their needs through discussion and/or role plays  Summary of Patient Progress:    ***    Therapeutic Modalities:   Cognitive Behavioral Therapy Solution-Focused Therapy Assertiveness Training   Cassian Torelli S Jawann Urbani, LCSW 

## 2022-06-28 NOTE — Progress Notes (Signed)
Pt. Engaged to attend group.  Left early "It's too much for me."  Encouraged to continue efforts to participate in community activities.

## 2022-06-28 NOTE — Progress Notes (Signed)
   06/28/22 0530  Sleep  Number of Hours 5.25

## 2022-06-28 NOTE — [facility] Group Notes (Signed)
Adult Psychoeducational Group Note  Date:  06/28/2022 Time:  10:43 AM  Group Topic/Focus:  Goals Group:   The focus of this group is to help patients establish daily goals to achieve during treatment and discuss how the patient can incorporate goal setting into their daily lives to aide in recovery.  Participation Level:  Did Not Attend  Kern Reap 06/28/2022, 10:43 AM

## 2022-06-28 NOTE — Group Note (Signed)
LCSW Group Therapy Note  Group Date: 06/28/2022 Start Time: 1300 End Time: 1345   Type of Therapy and Topic:  Group Therapy: Anger Cues and Responses  Participation Level:  Did Not Attend   Description of Group:   In this group, patients learned how to recognize the physical, cognitive, emotional, and behavioral responses they have to anger-provoking situations.  They identified a recent time they became angry and how they reacted.  They analyzed how their reaction was possibly beneficial and how it was possibly unhelpful.  The group discussed a variety of healthier coping skills that could help with such a situation in the future.  Focus was placed on how helpful it is to recognize the underlying emotions to our anger, because working on those can lead to a more permanent solution as well as our ability to focus on the important rather than the urgent.  Therapeutic Goals: Patients will remember their last incident of anger and how they felt emotionally and physically, what their thoughts were at the time, and how they behaved. Patients will identify how their behavior at that time worked for them, as well as how it worked against them. Patients will explore possible new behaviors to use in future anger situations. Patients will learn that anger itself is normal and cannot be eliminated, and that healthier reactions can assist with resolving conflict rather than worsening situations.    Marval Regal Chistian Kasler, LCSW 06/28/2022  3:51 PM

## 2022-06-28 NOTE — Progress Notes (Signed)
Patient ID: Kelly Hensley, female   DOB: 06/23/1996, 26 y.o.   MRN: [id number] Pt presents with depressed mood, affect congruent. [name] brightens upon approach, she states she is feeling better from Sunday and states '' I do think those medications are helping me. I did not sleep that well last night though. '' Patient seen drinking large cups of water at nurses station and states she '' still having those bad stomach cramps but it is getting better''  Patient denies any SI or HI or A/ V Hallucinations, however does endorse continued withdrawal symptoms as she reports muscle aches and soreness as well as stomach cramps. Able to eat well and drinking fluids per RN education.  Pt did report she didn't get to speak with mother which she was worried about , but would reach back out to her. NO other acute concerns voiced. Pt is safe, will con't to monitor.

## 2022-06-28 NOTE — Plan of Care (Signed)
  Problem: Education: Goal: Utilization of techniques to improve thought processes will improve Outcome: Progressing Goal: Knowledge of the prescribed therapeutic regimen will improve Outcome: Progressing   Problem: Activity: Goal: Interest or engagement in leisure activities will improve Outcome: Progressing Goal: Imbalance in normal sleep/wake cycle will improve Outcome: Progressing   Problem: Activity: Goal: Imbalance in normal sleep/wake cycle will improve Outcome: Progressing   Problem: Coping: Goal: Coping ability will improve Description: PT PROGRESSING Outcome: Progressing Goal: Will verbalize feelings Outcome: Progressing

## 2022-06-28 NOTE — Progress Notes (Signed)
[facility] MD Progress Note  06/28/2022 1:56 PM Kelly Hensley  MRN:  [id number] Principal Problem: MDD (major depressive disorder), recurrent severe, without psychosis ([place]) Diagnosis: Principal Problem:   MDD (major depressive disorder), recurrent severe, without psychosis ([place]) Active Problems:   Insomnia   Opioid use disorder   Anxiety state  HPI: Kelly Hensley is a 26 yo [place] American female who presented to the [facility] on 10/07 with complaints of worsening depressive symptoms and suicidal ideations with a plan to overdose on Fentanyl in the context of financial stressors, homelessness, Fentanyl abuse & grieving the recent death of her boyfriend.  Patient was transferred voluntarily to this [facility] for treatment and stabilization of her mood.   24 hour chart review: V/S are WNL for past 24 hrs, pt is compliant with scheduled meds. PRN meds given in the last 24 hrs are Bentyl, Hydroxyzine, Robaxin, Naproxen (please see MAR). Pt slept for a total of 5.25 hrs last night as per nursing flow sheets. She has not attended any unit group sessions for the past 24 hrs.  Today's patient assessment note: Pt's chart reviewed, case discussed with treatment team. Pt presents with a depressed mood, affect is congruent. Attention to personal hygiene and grooming is poor, pt is malodorous and the need to take a shower and tend to personal hygiene has been reiterated. Pt provided with personal hygiene items. Eye contact is good, speech is clear & coherent. Thought contents are organized and logical, and pt currently denies SI/HI/AVH or paranoia. There is no evidence of delusional thoughts.  Pt reports that her energy level is improving. She also reports that her depressive mood is improving as well. She reports a poor sleep quality last night and states that she was tossing and turning. She reports a fair appetite.  Pt reports that her diarrhea related to the opioid withdrawal has resolved. She reports mild generalized  body aches, other wise, no other withdrawal symptoms, and she denies craving. Pt reports that she has been bleeding for at least 2 weeks since her menstrual period started, and states that she was told in the past that it is related to having an implanted birth control device in her arm. She reports that she was told at the time the device was implanted that she has to return for f/u in 3 yrs, but this is year 5, and she has not followed up. Pt has been educated about the importance of following up with a gynecologist after discharge and has stated that she has one.   Pt reports that she intends to do outpatient rehab for substance abuse rather than going to inpatient rehab lasting at least 30 days. Pt reports that she spoke to her public defender who states that if she stays sober for 30 days while going to outpatient therapy, the charges against her related to possession of substances of abuse and operation of a stolen vehicle will he dropped. CSW is coordinating discharge planning and is seeking outpatient mental health services for patient.  We are increasing Trazodone to 100 mg nightly and an additional 50 mg as needed for insomnia. Other medications are being continued at their current doses as listed below.  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    Migraines    Morbid obesity (HCC)    Sickle cell trait ([place])     Past Surgical History:  Procedure Laterality Date   ABCESS DRAINAGE Left 2014   Family History:  Family History  Problem Relation Age of Onset   Diabetes Mother    Sickle cell anemia Father    Family Psychiatric  History: none reported Social History:  Social History   Substance and Sexual Activity  Alcohol Use Yes    Alcohol/week: 6.0 standard drinks of alcohol   Types: 3 Shots of liquor, 3 Glasses of wine per week   Comment: 3 vodkas weekly; wine 3 x week     Social History   Substance and Sexual Activity  Drug Use Yes   Frequency: 7.0 times per week   Types: Marijuana, Heroin, Fentanyl   Comment: daily use - ''.6 grams fentanyl snorting''    Social History   Socioeconomic History   Marital status: Single    Spouse name: Not on file   Number of children: 2   Years of education: 12   Highest education level: High school graduate  Occupational History   Not on file  Tobacco Use   Smoking status: Every Day    Packs/day: 1.00    Years: 10.00    Total pack years: 10.00    Types: Cigarettes    Last attempt to quit: 02/17/2015    Years since quitting: 7.3   Smokeless tobacco: Never  Vaping Use   Vaping Use: Never used  Substance and Sexual Activity   Alcohol use: Yes    Alcohol/week: 6.0 standard drinks of alcohol    Types: 3 Shots of liquor, 3 Glasses of wine per week    Comment: 3 vodkas weekly; wine 3 x week   Drug use: Yes    Frequency: 7.0 times per week    Types: Marijuana, Heroin, Fentanyl    Comment: daily use - ''.6 grams fentanyl snorting''   Sexual activity: Yes    Birth control/protection: Implant  Other Topics Concern   Not on file  Social History Narrative   Not on file   Social Determinants of Health   Financial Resource Strain: Not on file  Food Insecurity: No Food Insecurity (06/25/2022)   Hunger Vital Sign    Worried About Running Out of Food in the Last Year: Never true    Ran Out of Food in the Last Year: Never true  Transportation Needs: Unmet Transportation Needs (06/25/2022)   PRAPARE - Hydrologist (Medical): Yes    Lack of Transportation (Non-Medical): Yes  Physical Activity: Not on file  Stress: Not on file  Social Connections: Not on file   Additional Social History:   Sleep: Fair  Appetite:  Good  Current  Medications: Current Facility-Administered Medications  Medication Dose Route Frequency Provider Last Rate Last Admin   acetaminophen (TYLENOL) tablet 650 mg  650 mg Oral Q6H PRN Rozetta Nunnery, NP       alum & mag hydroxide-simeth (MAALOX/MYLANTA) 200-200-20 MG/5ML suspension 30 mL  30 mL Oral Q4H PRN Lindon Romp A, NP       cloNIDine (CATAPRES) tablet 0.1 mg  0.1 mg Oral Melven Sartorius, MD       Followed by   Derrill Memo ON 06/30/2022] cloNIDine (CATAPRES) tablet 0.1 mg  0.1 mg Oral QAC breakfast Rosezetta Schlatter, MD  dicyclomine (BENTYL) tablet 20 mg  20 mg Oral Q6H PRN Rosezetta Schlatter, MD   20 mg at 06/28/22 0748   [START ON 06/29/2022] escitalopram (LEXAPRO) tablet 10 mg  10 mg Oral Daily Massengill, Ovid Curd, MD       hydrOXYzine (ATARAX) tablet 25 mg  25 mg Oral TID PRN Rozetta Nunnery, NP   25 mg at 06/26/22 0520   hydrOXYzine (ATARAX) tablet 25 mg  25 mg Oral Q6H PRN Rosezetta Schlatter, MD   25 mg at 06/27/22 2105   loperamide (IMODIUM) capsule 2-4 mg  2-4 mg Oral PRN Rosezetta Schlatter, MD   4 mg at 06/27/22 0858   magnesium hydroxide (MILK OF MAGNESIA) suspension 30 mL  30 mL Oral Daily PRN Rozetta Nunnery, NP       methocarbamol (ROBAXIN) tablet 500 mg  500 mg Oral Q8H PRN Rosezetta Schlatter, MD   500 mg at 06/28/22 0748   naproxen (NAPROSYN) tablet 500 mg  500 mg Oral BID PRN Rosezetta Schlatter, MD   500 mg at 06/28/22 [id number]   nicotine polacrilex (NICORETTE) gum 2 mg  2 mg Oral PRN Nicholes Rough, NP       ondansetron (ZOFRAN-ODT) disintegrating tablet 4 mg  4 mg Oral Q6H PRN Rosezetta Schlatter, MD   4 mg at 06/26/22 0520   traZODone (DESYREL) tablet 100 mg  100 mg Oral QHS Massengill, Ovid Curd, MD       traZODone (DESYREL) tablet 50 mg  50 mg Oral QHS PRN Massengill, Ovid Curd, MD       vitamin D3 (CHOLECALCIFEROL) tablet 1,000 Units  1,000 Units Oral Daily Massengill, Ovid Curd, MD   1,000 Units at 06/28/22 [id number]    Lab Results:  Results for orders placed or performed during the hospital  encounter of 06/25/22 (from the past 48 hour(s))  Urinalysis, Complete w Microscopic     Status: Abnormal   Collection Time: 06/26/22  5:57 PM  Result Value Ref Range   Color, Urine YELLOW YELLOW   APPearance HAZY (A) CLEAR   Specific Gravity, Urine 1.034 (H) 1.005 - 1.030   pH 5.0 5.0 - 8.0   Glucose, UA NEGATIVE NEGATIVE mg/dL   Hgb urine dipstick MODERATE (A) NEGATIVE   Bilirubin Urine SMALL (A) NEGATIVE   Ketones, ur 20 (A) NEGATIVE mg/dL   Protein, ur 30 (A) NEGATIVE mg/dL   Nitrite NEGATIVE NEGATIVE   Leukocytes,Ua NEGATIVE NEGATIVE   RBC / HPF >50 (H) 0 - 5 RBC/hpf   WBC, UA 0-5 0 - 5 WBC/hpf   Bacteria, UA NONE SEEN NONE SEEN   Squamous Epithelial / LPF 6-10 0 - 5   Mucus PRESENT    Hyaline Casts, UA PRESENT     Comment: Performed at [facility], [place] [address], [place], [place] [address]  Hemoglobin A1c     Status: None   Collection Time: 06/27/22  6:53 AM  Result Value Ref Range   Hgb A1c MFr Bld 4.8 4.8 - 5.6 %    Comment: (NOTE) Pre diabetes:          5.7%-6.4%  Diabetes:              >6.4%  Glycemic control for   <7.0% adults with diabetes    Mean Plasma Glucose 91.06 mg/dL    Comment: Performed at [facility] [address] [place], [place] [address]  Lipid panel     Status: Abnormal   Collection Time: 06/27/22  6:53 AM  Result  Value Ref Range   Cholesterol 140 0 - 200 mg/dL   Triglycerides 108 <150 mg/dL   HDL 34 (L) >40 mg/dL   Total CHOL/HDL Ratio 4.1 RATIO   VLDL 22 0 - 40 mg/dL   LDL Cholesterol 84 0 - 99 mg/dL    Comment:        Total Cholesterol/HDL:CHD Risk Coronary Heart Disease Risk Table                     Men   Women  1/2 Average Risk   3.4   3.3  Average Risk       5.0   4.4  2 X Average Risk   9.6   7.1  3 X Average Risk  23.4   11.0        Use the calculated Patient Ratio above and the CHD Risk Table to determine the patient's CHD Risk.        ATP III CLASSIFICATION (LDL):  <100     mg/dL   Optimal   100-129  mg/dL   Near or Above                    Optimal  130-159  mg/dL   Borderline  160-189  mg/dL   High  >190     mg/dL   Very High Performed at [facility] [address] [place], [place] [address]   TSH     Status: None   Collection Time: 06/27/22  6:53 AM  Result Value Ref Range   TSH 1.304 0.350 - 4.500 uIU/mL    Comment: Performed by a 3rd Generation assay with a functional sensitivity of <=0.01 uIU/mL. Performed at [facility], [place] [address] [place], [place] [address]   VITAMIN D 25 Hydroxy (Vit-D Deficiency, Fractures)     Status: Abnormal   Collection Time: 06/27/22  6:53 AM  Result Value Ref Range   Vit D, 25-Hydroxy 14.99 (L) 30 - 100 ng/mL    Comment: (NOTE) Vitamin D deficiency has been defined by the [place] practice guideline as a level of serum 25-OH  vitamin D less than 20 ng/mL (1,2). The Endocrine Society went on to  further define vitamin D insufficiency as a level between 21 and 29  ng/mL (2).  1. IOM (Institute of Medicine). 2010. Dietary reference intakes for  calcium and D. [place]: The [employer]. 2. Holick MF, [place], Bischoff-Ferrari HA, et al. Evaluation,  treatment, and prevention of vitamin D deficiency: an Endocrine  Society clinical practice guideline, JCEM. 2011 Jul; 96(7): 1911-30.  Performed at [facility], [place] [address] [place], [place] [address]   Vitamin B12     Status: None   Collection Time: 06/27/22  6:53 AM  Result Value Ref Range   Vitamin B-12 337 180 - 914 pg/mL    Comment: (NOTE) This assay is not validated for testing neonatal or myeloproliferative syndrome specimens for Vitamin B12 levels. Performed at [facility], [place] [address], [place], [place] [address]     Blood Alcohol level:  Lab Results  Component Value Date   [facility] <10 06/24/2022   ETH <10 [id number]    Metabolic  Disorder Labs: Lab Results  Component Value Date   HGBA1C 4.8 06/27/2022   MPG 91.06 06/27/2022   MPG 82.45 01/11/2021   No results found for: "PROLACTIN" Lab Results  Component  Value Date   CHOL 140 06/27/2022   TRIG 108 06/27/2022   HDL 34 (L) 06/27/2022   CHOLHDL 4.1 06/27/2022   VLDL 22 06/27/2022   LDLCALC 84 06/27/2022   LDLCALC 82 01/11/2021   Physical Findings: AIMS: 0  CIWA: 0    COWS:  COWS Total Score: 8  Musculoskeletal: Strength & Muscle Tone: within normal limits Gait & Station: normal Patient leans: N/A  Psychiatric Specialty Exam:  Presentation  General Appearance:  Disheveled  Eye Contact: Fair  Speech: Clear and Coherent  Speech Volume: Normal  Handedness: Right  Mood and Affect  Mood: Depressed  Affect: Congruent  Thought Process  Thought Processes: Coherent  Descriptions of Associations:Intact  Orientation:Full (Time, Place and Person)  Thought Content:Logical  History of Schizophrenia/Schizoaffective disorder:No data recorded Duration of Psychotic Symptoms:No data recorded Hallucinations:Hallucinations: None  Ideas of Reference:None  Suicidal Thoughts:Suicidal Thoughts: No  Homicidal Thoughts:Homicidal Thoughts: No Sensorium  Memory: Immediate Fair  Judgment: Fair  Insight: Fair   Community education officer  Concentration: Fair  Attention Span: Fair  Recall: [employer] of Knowledge: Fair  Language: Fair Psychomotor Activity  Psychomotor Activity: Psychomotor Activity: Normal  Assets  Assets: Communication Skills  Sleep  Sleep: Sleep: Poor  Physical Exam: Physical Exam Review of Systems  Constitutional: Negative.  Negative for fever.  HENT: Negative.  Negative for sore throat.   Eyes: Negative.   Respiratory: Negative.    Cardiovascular: Negative.  Negative for chest pain.  Gastrointestinal:  Positive for diarrhea. Negative for nausea.  Genitourinary: Negative.   Musculoskeletal:   Positive for joint pain and myalgias.  Skin: Negative.   Neurological: Negative.   Psychiatric/Behavioral:  Positive for depression (improving on current medications) and substance abuse. Negative for hallucinations, memory loss and suicidal ideas. The patient is nervous/anxious (improving with current medications) and has insomnia.    Blood pressure 110/73, pulse 92, temperature 98.7 F (37.1 C), temperature source Oral, resp. rate 17, height 5\' 9"  (1.753 m), weight 93.4 kg, SpO2 100 %. Body mass index is 30.42 kg/m.  Treatment Plan Summary: Daily contact with patient to assess and evaluate symptoms and progress in treatment and Medication management   Observation Level/Precautions:  15 minute checks  Laboratory:  Labs reviewed   Psychotherapy:  Unit Group sessions  Medications:  See [facility]  Consultations:  To be determined   Discharge Concerns:  Safety, medication compliance, mood stability  Estimated LOS: 5-7 days  Other:  N/A    PLAN Safety and Monitoring: Voluntary admission to inpatient psychiatric unit for safety, stabilization and treatment Daily contact with patient to assess and evaluate symptoms and progress in treatment Patient's case to be discussed in multi-disciplinary team meeting Observation Level : q15 minute checks Vital signs: q12 hours Precautions: Safety   Long Term Goal(s): Improvement in symptoms so as ready for discharge   Short Term Goals: Ability to identify changes in lifestyle to reduce recurrence of condition will improve, Ability to disclose and discuss suicidal ideas, Ability to demonstrate self-control will improve, Ability to identify and develop effective coping behaviors will improve, Ability to maintain clinical measurements within normal limits will improve, Compliance with prescribed medications will improve, and Ability to identify triggers associated with substance abuse/mental health issues will improve   Diagnoses:  Principal Problem:   MDD  (major depressive disorder), recurrent episode, severe ([place]) Active Problems:   Insomnia   Opioid use disorder   Anxiety state   Medications MDD -Discontinue Wellbutrin XL 150 mg as this might not  be effective in management of depressive symptoms in the context of Opioid use d/o and anxiety -Continue Lexapro 10 mg daily for depressive symptoms   Anxiety -Continue Hydroxyzine 25 mg every 6 hours PRN   Insomnia -Increase Trazodone to 100 mg nightly and repeat 50 mg PRN   Opoid use disorder -Continue Clonidine detox protocol -Continue Robaxin 500 mg PRN Q 8 H for body aches -Continue Naproxen 500 mg BID PRN for pain  Vitamin D deficiency -Continue Vitamin D3 1000 units daily   Other PRNS -Continue Tylenol 650 mg every 6 hours PRN for mild pain -Continue Maalox 30 mg every 4 hrs PRN for indigestion -Continue Imodium 2-4 mg as needed for diarrhea -Continue Milk of Magnesia as needed every 6 hrs for constipation -Continue Zofran disintegrating tabs every 6 hrs PRN for nausea  -Continue Nicorette gum PRN for nicotine addiction   Discharge Planning: Social work and case management to assist with discharge planning and identification of hospital follow-up needs prior to discharge Estimated LOS: 5-7 days Discharge Concerns: Need to establish a safety plan; Medication compliance and effectiveness Discharge Goals: Return home with outpatient referrals for mental health follow-up including medication management/psychotherapy   Nicholes Rough, NP 06/28/2022, 1:56 PMPatient ID: Kelly Hensley, female   DOB: 03/24/1996, 26 y.o.   MRN: [id number]

## 2022-06-28 NOTE — Group Note (Unsigned)
LCSW Group Therapy Note   Group Date: 06/28/2022 Start Time: 1300 End Time: 1400   Type of Therapy and Topic:  Group Therapy:   Participation Level:  {B[facility] PARTICIPATION LEVEL:22264}  Description of Group:   Therapeutic Goals:  1.     Summary of Patient Progress:    ***  Therapeutic Modalities:   Vinisha Faxon S Maricruz Lucero, LCSWA 06/28/2022  3:43 PM    

## 2022-06-29 MED ORDER — ESCITALOPRAM OXALATE 5 MG PO TABS
15.0000 mg | ORAL_TABLET | Freq: Every day | ORAL | Status: DC
  Administered 2022-06-30: 15 mg via ORAL
  Filled 2022-06-29 (×3): qty 3

## 2022-06-29 NOTE — Progress Notes (Signed)
[facility] MD Progress Note  06/29/2022 12:53 PM Kelly Hensley  MRN:  [id number] Principal Problem: MDD (major depressive disorder), recurrent severe, without psychosis (HCC) Diagnosis: Principal Problem:   MDD (major depressive disorder), recurrent severe, without psychosis (HCC) Active Problems:   Insomnia   Opioid use disorder   Anxiety state  HPI: Kelly Hensley is a 26 yo [place] American female who presented to the [facility] on 10/07 with complaints of worsening depressive symptoms and suicidal ideations with a plan to overdose on Fentanyl in the context of financial stressors, homelessness, Fentanyl abuse & grieving the recent death of her boyfriend.  Patient was transferred voluntarily to this [facility] for treatment and stabilization of her mood.   24 hour chart review: V/S continue to be WNL, she remains compliant with scheduled meds. PRN meds given in the last 24 hrs are Bentyl, Hydroxyzine, Robaxin, Naproxen (please see MAR). Pt slept for a total of 6.25 hrs last night as per nursing flow sheets. She has not attended any unit group sessions since admission. She has stated that she is unable to write, and doesn't want to be embarrassed in groups.   Today's patient assessment note: Pt's chart reviewed, case discussed with treatment team. Pt's mood remains depressed, patient rates her depression as a 7 (10 being the worst).  Patient reports her anxiety as being a 6 (10 being the worst).  She however denies suicidal ideations, denies homicidal ideations, denies auditory or visual has hallucinations, denies paranoia and there is no evidence of delusional thinking.  Attention to personal hygiene and grooming remains poor and the need to tend to personal hygiene needs reiterated.  Patient reports that the medications are helpful, and reports that her sleep quality has improved and her depressive symptoms are improving.  She reports a fair appetite, and states that she has not been coming to group sessions  because she does not know how to ride.  Patient educated that she can sit in groups and participate without necessarily writing.  Patient reports some generalized body pains related to opioid withdrawals but states that naproxen and Robaxin are helpful.  We are increasing Lexapro to 15 mg starting tomorrow 10/13.  Patient denies any medication related side effects.  She reports that the nausea has resolved.  She continues to report that she does not want to go to inpatient rehab for substance use after this hospitalization.  CSW coordinating discharge planning.  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    Migraines    Morbid obesity (HCC)    Sickle cell trait (HCC)     Past Surgical History:  Procedure Laterality Date   ABCESS DRAINAGE Left 2014   Family History:  Family History  Problem Relation Age of Onset   Diabetes Mother    Sickle cell anemia Father    Family Psychiatric  History: none reported Social History:  Social History   Substance and Sexual Activity  Alcohol Use Yes   Alcohol/week: 6.0 standard drinks of alcohol   Types: 3 Shots of liquor, 3 Glasses of wine per week   Comment: 3 vodkas weekly; wine 3 x week     Social History   Substance  and Sexual Activity  Drug Use Yes   Frequency: 7.0 times per week   Types: Marijuana, Heroin, Fentanyl   Comment: daily use - ''.6 grams fentanyl snorting''    Social History   Socioeconomic History   Marital status: Single    Spouse name: Not on file   Number of children: 2   Years of education: 12   Highest education level: High school graduate  Occupational History   Not on file  Tobacco Use   Smoking status: Every Day    Packs/day: 1.00    Years: 10.00    Total  pack years: 10.00    Types: Cigarettes    Last attempt to quit: 02/17/2015    Years since quitting: 7.3   Smokeless tobacco: Never  Vaping Use   Vaping Use: Never used  Substance and Sexual Activity   Alcohol use: Yes    Alcohol/week: 6.0 standard drinks of alcohol    Types: 3 Shots of liquor, 3 Glasses of wine per week    Comment: 3 vodkas weekly; wine 3 x week   Drug use: Yes    Frequency: 7.0 times per week    Types: Marijuana, Heroin, Fentanyl    Comment: daily use - ''.6 grams fentanyl snorting''   Sexual activity: Yes    Birth control/protection: Implant  Other Topics Concern   Not on file  Social History Narrative   Not on file   Social Determinants of Health   Financial Resource Strain: Not on file  Food Insecurity: No Food Insecurity (06/25/2022)   Hunger Vital Sign    Worried About Running Out of Food in the Last Year: Never true    Ran Out of Food in the Last Year: Never true  Transportation Needs: Unmet Transportation Needs (06/25/2022)   PRAPARE - Administrator, Civil Service (Medical): Yes    Lack of Transportation (Non-Medical): Yes  Physical Activity: Not on file  Stress: Not on file  Social Connections: Not on file   Additional Social History:   Sleep: Fair  Appetite:  Good  Current Medications: Current Facility-Administered Medications  Medication Dose Route Frequency Provider Last Rate Last Admin   acetaminophen (TYLENOL) tablet 650 mg  650 mg Oral Q6H PRN Jackelyn Poling, NP       alum & mag hydroxide-simeth (MAALOX/MYLANTA) 200-200-20 MG/5ML suspension 30 mL  30 mL Oral Q4H PRN Nira Conn A, NP       cloNIDine (CATAPRES) tablet 0.1 mg  0.1 mg Oral Elita Quick, Courtney, MD   0.1 mg at 06/29/22 0757   Followed by   Melene Muller ON 06/30/2022] cloNIDine (CATAPRES) tablet 0.1 mg  0.1 mg Oral QAC breakfast Lamar Sprinkles, MD       dicyclomine (BENTYL) tablet 20 mg  20 mg Oral Q6H PRN Lamar Sprinkles, MD   20 mg at 06/29/22 0801   [START ON  06/30/2022] escitalopram (LEXAPRO) tablet 15 mg  15 mg Oral Daily Ranell Skibinski, NP       hydrOXYzine (ATARAX) tablet 25 mg  25 mg Oral TID PRN Nira Conn A, NP   25 mg at 06/26/22 0520   hydrOXYzine (ATARAX) tablet 25 mg  25 mg Oral Q6H PRN Lamar Sprinkles, MD   25 mg at 06/28/22 2126   loperamide (IMODIUM) capsule 2-4 mg  2-4 mg Oral PRN Lamar Sprinkles, MD   4 mg at 06/27/22 0858   magnesium hydroxide (MILK OF MAGNESIA) suspension 30 mL  30 mL Oral Daily PRN  Jackelyn Poling, NP       methocarbamol (ROBAXIN) tablet 500 mg  500 mg Oral Q8H PRN Lamar Sprinkles, MD   500 mg at 06/28/22 2126   naproxen (NAPROSYN) tablet 500 mg  500 mg Oral BID PRN Lamar Sprinkles, MD   500 mg at 06/29/22 0759   nicotine polacrilex (NICORETTE) gum 2 mg  2 mg Oral PRN Starleen Blue, NP       ondansetron (ZOFRAN-ODT) disintegrating tablet 4 mg  4 mg Oral Q6H PRN Lamar Sprinkles, MD   4 mg at 06/26/22 0520   traZODone (DESYREL) tablet 100 mg  100 mg Oral QHS Massengill, Harrold Donath, MD   100 mg at 06/28/22 2123   traZODone (DESYREL) tablet 50 mg  50 mg Oral QHS PRN Massengill, Harrold Donath, MD       vitamin D3 (CHOLECALCIFEROL) tablet 1,000 Units  1,000 Units Oral Daily Phineas Inches, MD   1,000 Units at 06/29/22 0757    Lab Results:  No results found for this or any previous visit (from the past 48 hour(s)).   Blood Alcohol level:  Lab Results  Component Value Date   ETH <10 06/24/2022   ETH <10 01/08/2021    Metabolic Disorder Labs: Lab Results  Component Value Date   HGBA1C 4.8 06/27/2022   MPG 91.06 06/27/2022   MPG 82.45 01/11/2021   No results found for: "PROLACTIN" Lab Results  Component Value Date   CHOL 140 06/27/2022   TRIG 108 06/27/2022   HDL 34 (L) 06/27/2022   CHOLHDL 4.1 06/27/2022   VLDL 22 06/27/2022   LDLCALC 84 06/27/2022   LDLCALC 82 01/11/2021   Physical Findings: AIMS: 0  CIWA: 0    COWS:  COWS Total Score: 5  Musculoskeletal: Strength & Muscle Tone: within normal  limits Gait & Station: normal Patient leans: N/A  Psychiatric Specialty Exam:  Presentation  General Appearance:  Appropriate for Environment; Fairly Groomed  Eye Contact: Fair  Speech: Clear and Coherent  Speech Volume: Normal  Handedness: Right  Mood and Affect  Mood: Depressed  Affect: Congruent  Thought Process  Thought Processes: Coherent  Descriptions of Associations:Intact  Orientation:Full (Time, Place and Person)  Thought Content:Logical  History of Schizophrenia/Schizoaffective disorder:No data recorded Duration of Psychotic Symptoms:No data recorded Hallucinations:Hallucinations: None  Ideas of Reference:None  Suicidal Thoughts:Suicidal Thoughts: No  Homicidal Thoughts:Homicidal Thoughts: No Sensorium  Memory: Immediate Good  Judgment: Fair  Insight: Fair   Chartered certified accountant: Fair  Attention Span: Fair  Recall: [employer] of Knowledge: Fair  Language: Fair Psychomotor Activity  Psychomotor Activity: Psychomotor Activity: Normal  Assets  Assets: Resilience  Sleep  Sleep: Sleep: Fair  Physical Exam: Physical Exam Review of Systems  Constitutional: Negative.  Negative for fever.  HENT: Negative.  Negative for sore throat.   Eyes: Negative.   Respiratory: Negative.    Cardiovascular: Negative.  Negative for chest pain.  Gastrointestinal:  Positive for diarrhea. Negative for nausea.  Genitourinary: Negative.   Musculoskeletal:  Positive for joint pain and myalgias.  Skin: Negative.   Neurological: Negative.   Psychiatric/Behavioral:  Positive for depression (improving on current medications) and substance abuse. Negative for hallucinations, memory loss and suicidal ideas. The patient is nervous/anxious (improving with current medications) and has insomnia.    Blood pressure 108/74, pulse 98, temperature 98.8 F (37.1 C), temperature source Oral, resp. rate 17, height 5\' 9"  (1.753 m), weight  93.4 kg, SpO2 100 %. Body mass index is 30.42 kg/m.  Treatment  Plan Summary: Daily contact with patient to assess and evaluate symptoms and progress in treatment and Medication management   Observation Level/Precautions:  15 minute checks  Laboratory:  Labs reviewed   Psychotherapy:  Unit Group sessions  Medications:  See [facility]  Consultations:  To be determined   Discharge Concerns:  Safety, medication compliance, mood stability  Estimated LOS: 5-7 days  Other:  N/A    PLAN Safety and Monitoring: Voluntary admission to inpatient psychiatric unit for safety, stabilization and treatment Daily contact with patient to assess and evaluate symptoms and progress in treatment Patient's case to be discussed in multi-disciplinary team meeting Observation Level : q15 minute checks Vital signs: q12 hours Precautions: Safety   Long Term Goal(s): Improvement in symptoms so as ready for discharge   Short Term Goals: Ability to identify changes in lifestyle to reduce recurrence of condition will improve, Ability to disclose and discuss suicidal ideas, Ability to demonstrate self-control will improve, Ability to identify and develop effective coping behaviors will improve, Ability to maintain clinical measurements within normal limits will improve, Compliance with prescribed medications will improve, and Ability to identify triggers associated with substance abuse/mental health issues will improve   Diagnoses:  Principal Problem:   MDD (major depressive disorder), recurrent episode, severe ([place]) Active Problems:   Insomnia   Opioid use disorder   Anxiety state   Medications MDD -Discontinue Wellbutrin XL 150 mg as this might not be effective in management of depressive symptoms in the context of Opioid use d/o and anxiety -Increase Lexapro from 10 mg to 15 mg daily for depressive symptoms starting 10/13   Anxiety -Continue Hydroxyzine 25 mg every 6 hours PRN   Insomnia -Increase Trazodone  to 100 mg nightly and repeat 50 mg PRN   Opoid use disorder -Continue Clonidine detox protocol -Continue Robaxin 500 mg PRN Q 8 H for body aches -Continue Naproxen 500 mg BID PRN for pain  Vitamin D deficiency -Continue Vitamin D3 1000 units daily   Other PRNS -Continue Tylenol 650 mg every 6 hours PRN for mild pain -Continue Maalox 30 mg every 4 hrs PRN for indigestion -Continue Imodium 2-4 mg as needed for diarrhea -Continue Milk of Magnesia as needed every 6 hrs for constipation -Continue Zofran disintegrating tabs every 6 hrs PRN for nausea  -Continue Nicorette gum PRN for nicotine addiction   Discharge Planning: Social work and case management to assist with discharge planning and identification of hospital follow-up needs prior to discharge Estimated LOS: 5-7 days Discharge Concerns: Need to establish a safety plan; Medication compliance and effectiveness Discharge Goals: Return home with outpatient referrals for mental health follow-up including medication management/psychotherapy   Nicholes Rough, NP 06/29/2022, 12:53 PMPatient ID: Kelly Hensley, female   DOB: 20-Aug-1996, 26 y.o.   MRN: [id number] Patient ID: Kelly Hensley, female   DOB: 1995/10/27, 26 y.o.   MRN: [id number]

## 2022-06-29 NOTE — Progress Notes (Signed)
Pt in room, complains of continued withdrawal symptoms including abdominal cramps and joint pain as well as anxiety. Pt not attending all groups, endorses fear of being embarrassed in group as the reason. Staff offering support, pt encouraged to attend groups as able. Pt denies SI/HI/self harm thoughts.

## 2022-06-29 NOTE — Progress Notes (Signed)
   06/29/22 0200  Psych Admission Type (Psych Patients Only)  Admission Status Voluntary  Psychosocial Assessment  Patient Complaints Substance abuse;Sleep disturbance  Eye Contact Fair  Facial Expression Anxious  Affect Anxious  Speech Logical/coherent  Interaction Assertive  Motor Activity Slow  Appearance/Hygiene Disheveled  Behavior Characteristics Guarded  Mood Anxious  Thought Process  Coherency WDL  Content WDL  Delusions WDL  Perception WDL  Hallucination None reported or observed  Judgment Limited  Confusion None  Danger to Self  Current suicidal ideation?  (Denies)  Agreement Not to Harm Self Yes  Description of Agreement verbal  Danger to Others  Danger to Others None reported or observed

## 2022-06-29 NOTE — Progress Notes (Signed)
Psychoeducational Group Note  Date:  06/29/2022 Time:  2051  Group Topic/Focus:  Wrap-Up Group:   The focus of this group is to help patients review their daily goal of treatment and discuss progress on daily workbooks.  Participation Level: Did Not Attend  Participation Quality:  Not Applicable  Affect:  Not Applicable  Cognitive:  Not Applicable  Insight:  Not Applicable  Engagement in Group: Not Applicable  Additional Comments:  The patient did not attend group this evening.   Archie Balboa S 06/29/2022, 8:51 PM

## 2022-06-30 DIAGNOSIS — F332 Major depressive disorder, recurrent severe without psychotic features: Secondary | ICD-10-CM

## 2022-06-30 MED ORDER — TRAZODONE HCL 100 MG PO TABS: 100.0000 mg | ORAL_TABLET | Freq: Every day | ORAL | 0 refills | Status: DC

## 2022-06-30 MED ORDER — NICOTINE POLACRILEX 2 MG MT GUM: 2.0000 mg | CHEWING_GUM | OROMUCOSAL | 0 refills | Status: DC | PRN

## 2022-06-30 MED ORDER — ESCITALOPRAM OXALATE 5 MG PO TABS: 15.0000 mg | ORAL_TABLET | Freq: Every day | ORAL | 0 refills | Status: DC

## 2022-06-30 MED ORDER — HYDROXYZINE HCL 25 MG PO TABS: 25.0000 mg | ORAL_TABLET | Freq: Three times a day (TID) | ORAL | 0 refills | Status: DC | PRN

## 2022-06-30 MED ORDER — VITAMIN D3 25 MCG PO TABS: 1000.0000 [IU] | ORAL_TABLET | Freq: Every day | ORAL | 0 refills | Status: AC

## 2022-06-30 NOTE — Discharge Instructions (Signed)
-  Follow-up with your outpatient psychiatric provider -instructions on appointment date, time, and address (location) are provided to you in discharge paperwork.  -Take your psychiatric medications as prescribed at discharge - instructions are provided to you in the discharge paperwork  -Follow-up with outpatient primary care doctor and other specialists -for management of preventative medicine and any chronic medical disease.  -Recommend abstinence from alcohol, tobacco, and other illicit drug use at discharge.   -If your psychiatric symptoms recur, worsen, or if you have side effects to your psychiatric medications, call your outpatient psychiatric provider, 911, [phone] or go to the nearest emergency department.  -If suicidal thoughts recur, call your outpatient psychiatric provider, 911, [phone] or go to the nearest emergency department.  

## 2022-06-30 NOTE — Group Note (Signed)
Recreation Therapy Group Note   Group Topic:Team Building  Group Date: 06/30/2022 Start Time: 0930 End Time: 0955 Facilitators: Kru Allman-McCall, LRT,CTRS Location: [address] Day[address]   Goal Area(s) Addresses:  Patient will effectively work with peer towards shared goal.  Patient will identify skills used to make activity successful.  Patient will identify how skills used during activity can be applied to reach post d/c goals.   Group Description: [employer]. In teams of 5-6, patients were given 12 craft pipe cleaners. Using the materials provided, patients were instructed to compete again the opposing team(s) to build the tallest free-standing structure from floor level. The activity was timed; difficulty increased by Probation officer as Pharmacist, hospital continued.  Systematically resources were removed with additional directions for example, placing one arm behind their back, working in silence, and shape stipulations. LRT facilitated post-activity discussion reviewing team processes and necessary communication skills involved in completion. Patients were encouraged to reflect how the skills utilized, or not utilized, in this activity can be incorporated to positively impact support systems post discharge.   Affect/Mood: Appropriate   Participation Level: Engaged   Participation Quality: Independent   Behavior: Appropriate   Speech/Thought Process: Focused   Insight: Good   Judgement: Good   Modes of Intervention: STEM Activity   Patient Response to Interventions:  Engaged   Education Outcome:  Acknowledges education and In group clarification offered    Clinical Observations/Individualized Feedback: Pt engaged and worked well with peers in completing activity.    Plan: Continue to engage patient in RT group sessions 2-3x/week.   Kelly Hensley, LRT,CTRS 06/30/2022 10:48 AM

## 2022-06-30 NOTE — Discharge Summary (Signed)
Physician Discharge Summary Note  Patient:  Kelly Hensley is an 26 y.o., female MRN:  [id number] DOB:  08-13-1996 Patient phone:  [phone] (home)  Patient address:   [address] [place] [address],  Total Time spent with patient: 30 minutes  Date of Admission:  06/25/2022 Date of Discharge: 06/30/2022  Reason for Admission:  Kelly Hensley is a 26 yo [place] American female who presented to the [facility] on 10/07 with complaints of worsening depressive symptoms and suicidal ideations with a plan to overdose on Fentanyl in the context of financial stressors, homelessness, Fentanyl abuse & grieving the recent death of her boyfriend.  Patient was transferred voluntarily to this [facility] for treatment and stabilization of her mood.     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    Migraines    Morbid obesity (HCC)    Sickle cell trait (HCC)     Past Surgical History:  Procedure Laterality Date   ABCESS DRAINAGE Left 2014   Family History:  Family History  Problem Relation Age of Onset   Diabetes Mother    Sickle cell anemia Father    Family Psychiatric  History: none reported Social History:  Social History   Substance and Sexual Activity  Alcohol Use Yes   Alcohol/week: 6.0 standard drinks of alcohol   Types: 3 Shots of liquor, 3 Glasses of wine per week   Comment: 3 vodkas weekly; wine 3 x week     Social History   Substance and Sexual Activity  Drug Use Yes   Frequency: 7.0 times per week   Types:  Marijuana, Heroin, Fentanyl   Comment: daily use - ''.6 grams fentanyl snorting''    Social History   Socioeconomic History   Marital status: Single    Spouse name: Not on file   Number of children: 2   Years of education: 12   Highest education level: High school graduate  Occupational History   Not on file  Tobacco Use   Smoking status: Every Day    Packs/day: 1.00    Years: 10.00    Total pack years: 10.00    Types: Cigarettes    Last attempt to quit: 02/17/2015    Years since quitting: 7.3   Smokeless tobacco: Never  Vaping Use   Vaping Use: Never used  Substance and Sexual Activity   Alcohol use: Yes    Alcohol/week: 6.0 standard drinks of alcohol    Types: 3 Shots of liquor, 3 Glasses of wine per week    Comment: 3 vodkas weekly; wine 3 x week   Drug use: Yes    Frequency: 7.0 times per week    Types: Marijuana, Heroin, Fentanyl    Comment: daily use - ''.6 grams fentanyl snorting''   Sexual activity: Yes    Birth control/protection: Implant  Other Topics Concern   Not on file  Social History Narrative   Not on file   Social Determinants of Health   Financial Resource Strain: Not on  file  Food Insecurity: No Food Insecurity (06/25/2022)   Hunger Vital Sign    Worried About Running Out of Food in the Last Year: Never true    Ran Out of Food in the Last Year: Never true  Transportation Needs: Unmet Transportation Needs (06/25/2022)   PRAPARE - Administrator, Civil Service (Medical): Yes    Lack of Transportation (Non-Medical): Yes  Physical Activity: Not on file  Stress: Not on file  Social Connections: Not on file                                                HOSPITAL COURSE During the patient's hospitalization, patient had extensive initial psychiatric evaluation, and follow-up psychiatric evaluations every day. Psychiatric diagnoses provided upon initial assessment were as follows:   Principal Problem:   MDD (major depressive disorder),  recurrent severe, without psychosis (HCC) Active Problems:   Insomnia   Opioid use disorder   Anxiety state Patient's psychiatric medications were adjusted on admission as follows: MDD -Started Wellbutrin XL 150 mg daily for depressive symptoms   Anxiety -Started Hydroxyzine 25 mg every 6 hours PRN   Insomnia -Started Trazodone 50 mg nightly PRN   Opoid use disorder -Started Clonidine detox protocol   During the hospitalization, other adjustments were made to the patient's psychiatric medication regimen, with discharge meds being as follows:    MDD -Discontinued Wellbutrin XL 150 mg as this might not be effective in management of depressive symptoms in the context of Opioid use d/o and anxiety -Continue Lexapro 15 mg daily for depressive symptoms    Anxiety -Continue Hydroxyzine 25 mg every 6 hours PRN   Insomnia -Continue Trazodone to 100 mg nightly   Opoid use disorder Educated on cessation of opioid use, not agreeable to inpatient rehab for substance abuse at this time    Vitamin D deficiency -Continue Vitamin D3 1000 units daily   Nicotine Dependence -Continue Nicotine gum PRN   Patient's care was discussed during the interdisciplinary team meeting every day during the hospitalization. The patient denies having side effects to prescribed psychiatric medication. The patient was evaluated each day by a clinical provider to ascertain response to treatment. Improvement was noted by the patient's report of decreasing symptoms, improved sleep and appetite, affect, medication tolerance, behavior, and participation in unit programming.  Patient was asked each day to complete a self inventory noting mood, mental status, pain, new symptoms, anxiety and concerns.     Symptoms were reported as significantly decreased or resolved completely by discharge. On day of discharge, the patient reports that their mood is stable. The patient denied having suicidal thoughts for more than 48  hours prior to discharge.  Patient denies having homicidal thoughts.  Patient denies having auditory hallucinations.  Patient denies any visual hallucinations or other symptoms of psychosis. The patient was motivated to continue taking medication with a goal of continued improvement in mental health.    The patient reports their target psychiatric symptoms of depression, anxiety and insomnia, and opioid withdrawal symptoms responded well to the psychiatric medications, and the patient reports overall benefit other psychiatric hospitalization. Supportive psychotherapy was provided to the patient. The patient also participated in regular group therapy while hospitalized. Coping skills, problem solving as well as relaxation therapies were also part of the unit programming.   Labs were reviewed with the  patient, and abnormal results were discussed with the patient.   Physical Findings: AIMS: 0 CIWA:   n/a COWS:  COWS Total Score: 1  Musculoskeletal: Strength & Muscle Tone: within normal limits Gait & Station: normal Patient leans: N/A   Psychiatric Specialty Exam:  Presentation  General Appearance:  Appropriate for Environment; Fairly Groomed  Eye Contact: Good  Speech: Clear and Coherent  Speech Volume: Normal  Handedness: Right   Mood and Affect  Mood: Euthymic  Affect: Congruent   Thought Process  Thought Processes: Coherent  Descriptions of Associations:Intact  Orientation:Full (Time, Place and Person)  Thought Content:Logical  History of Schizophrenia/Schizoaffective disorder:No data recorded Duration of Psychotic Symptoms:No data recorded Hallucinations:Hallucinations: None  Ideas of Reference:None  Suicidal Thoughts:Suicidal Thoughts: No  Homicidal Thoughts:Homicidal Thoughts: No   Sensorium  Memory: Immediate Good  Judgment: Good  Insight: Good   Executive Functions  Concentration: Good  Attention Span: Good  Recall: Good  Fund  of Knowledge: Good  Language: Good   Psychomotor Activity  Psychomotor Activity: Psychomotor Activity: Normal   Assets  Assets: Communication Skills   Sleep  Sleep: Sleep: Good    Physical Exam: Physical Exam Constitutional:      Appearance: Normal appearance.  HENT:     Nose: Nose normal.  Eyes:     Pupils: Pupils are equal, round, and reactive to light.  Neurological:     General: No focal deficit present.     Mental Status: She is alert and oriented to person, place, and time.  Psychiatric:        Behavior: Behavior normal.        Thought Content: Thought content normal.    Review of Systems  Constitutional: Negative.   HENT: Negative.    Eyes: Negative.   Respiratory: Negative.    Cardiovascular: Negative.   Gastrointestinal: Negative.   Genitourinary: Negative.   Musculoskeletal: Negative.   Skin: Negative.   Neurological: Negative.   Psychiatric/Behavioral:  Positive for depression (denies SI, verbally contracts for safety outside of [facility]) and substance abuse. Negative for hallucinations, memory loss and suicidal ideas. The patient is nervous/anxious (improving) and has insomnia (improving).    Blood pressure 116/75, pulse 88, temperature 98.5 F (36.9 C), temperature source Oral, resp. rate 17, height 5\' 9"  (1.753 m), weight 93.4 kg, SpO2 100 %. Body mass index is 30.42 kg/m.   Social History   Tobacco Use  Smoking Status Every Day   Packs/day: 1.00   Years: 10.00   Total pack years: 10.00   Types: Cigarettes   Last attempt to quit: 02/17/2015   Years since quitting: 7.3  Smokeless Tobacco Never   Tobacco Cessation:  A prescription for an FDA-approved tobacco cessation medication provided at discharge   Blood Alcohol level:  Lab Results  Component Value Date   ETH <10 06/24/2022   ETH <10 01/08/2021    Metabolic Disorder Labs:  Lab Results  Component Value Date   HGBA1C 4.8 06/27/2022   MPG 91.06 06/27/2022   MPG 82.45 01/11/2021    No results found for: "PROLACTIN" Lab Results  Component Value Date   CHOL 140 06/27/2022   TRIG 108 06/27/2022   HDL 34 (L) 06/27/2022   CHOLHDL 4.1 06/27/2022   VLDL 22 06/27/2022   LDLCALC 84 06/27/2022   LDLCALC 82 01/11/2021    See Psychiatric Specialty Exam and Suicide Risk Assessment completed by Attending Physician prior to discharge.  Discharge destination:  Home  Is patient on multiple antipsychotic therapies at discharge:  No   Has Patient had three or more failed trials of antipsychotic monotherapy by history:  No  Recommended Plan for Multiple Antipsychotic Therapies: NA  Discharge Instructions     Diet - low sodium heart healthy   Complete by: As directed    Increase activity slowly   Complete by: As directed       Allergies as of 06/30/2022   No Known Allergies      Medication List     TAKE these medications      Indication  escitalopram 5 MG tablet Commonly known as: LEXAPRO Take 3 tablets (15 mg total) by mouth daily. Start taking on: July 01, 2022  Indication: Major Depressive Disorder   hydrOXYzine 25 MG tablet Commonly known as: ATARAX Take 1 tablet (25 mg total) by mouth 3 (three) times daily as needed for up to 10 doses for anxiety. What changed: when to take this  Indication: Feeling Anxious   nicotine 14 mg/24hr patch Commonly known as: NICODERM CQ - dosed in mg/24 hours Place 1 patch (14 mg total) onto the skin daily. (May buy from over the counter): For smoking  Indication: Nicotine Addiction   nicotine polacrilex 2 MG gum Commonly known as: NICORETTE Take 1 each (2 mg total) by mouth as needed for smoking cessation.  Indication: Nicotine Addiction   traZODone 100 MG tablet Commonly known as: DESYREL Take 1 tablet (100 mg total) by mouth at bedtime. What changed:  when to take this reasons to take this  Indication: Trouble Sleeping   vitamin D3 25 MCG tablet Commonly known as: CHOLECALCIFEROL Take 1 tablet  (1,000 Units total) by mouth daily. Start taking on: July 01, 2022  Indication: Vitamin D Deficiency        Follow-up Information     [facility]. Go to.   Specialty: Behavioral Health Why: Please call or go to this provider to schedule an assessment, to obtain therapy and medication management services; on a Monday or a Wednesday morning, arrive by 7:30 am.  Services are provided on a first come, first served basis.  Once you have scheduled and completed your assessment, appointments will be scheduled for therapy and medication management. Contact information: [address] [place] [id number] [phone]        [facility] Go to.   Specialty: Professional Counselor Why: Please use the walk-in hours for new patient appointments, Monday through Friday from 8:30am to 12:00pm and from 1:00pm to 2:30pm.  This is for therapy, medication management, and group services. Contact information: [facility] [address] [place] [id number] [phone]                Follow-up recommendations:   The patient is able to verbalize their individual safety plan to this provider.   # It is recommended to the patient to continue psychiatric medications as prescribed, after discharge from the hospital.     # It is recommended to the patient to follow up with your outpatient psychiatric provider and PCP.   # It was discussed with the patient, the impact of alcohol, drugs, tobacco have been there overall psychiatric and medical wellbeing, and total abstinence from substance use was recommended the patient.ed.   # Prescriptions provided or sent directly to preferred pharmacy at discharge. Patient agreeable to plan. Given opportunity to ask questions. Appears to feel comfortable with discharge.    # In the event of worsening symptoms,  the patient is instructed to call the crisis hotline (988),  911 and or go to the nearest ED for appropriate evaluation and treatment of symptoms. To follow-up with primary care provider for other medical issues, concerns and or health care needs   # Patient was discharged home to her mother's house with a plan to follow up as noted above.    Signed: Nicholes Rough, NP 06/30/2022, 2:50 PM

## 2022-06-30 NOTE — Progress Notes (Signed)
  [facility] Adult Case Management Discharge Plan :  Will you be returning to the same living situation after discharge:  Yes,  Patient to return to place of residence.  At discharge, do you have transportation home?: Yes,  CSW to provide patient with transportation voucher to [place]  Do you have the ability to pay for your medications: Yes,  [place] Medicaid   Release of information consent forms completed and in the chart;  Patient's signature needed at discharge.  Patient to Follow up at:  [facility]. Go to.   Specialty: Behavioral Health Why: Please call or go to this provider to schedule an assessment, to obtain therapy and medication management services; on a Monday or a Wednesday morning, arrive by 7:30 am.  Services are provided on a first come, first served basis.  Once you have scheduled and completed your assessment, appointments will be scheduled for therapy and medication management. Contact information: [place] [address] [place] to.   Specialty: Professional Counselor Why: Please use the walk-in hours for new patient appointments, Monday through Friday from 8:30am to 12:00pm and from 1:00pm to 2:30pm.  This is for therapy, medication management, and group services. Contact information: [facility] [place] [address] [phone]                 Next level of care provider has access to [place] and Suicide Prevention discussed: Yes,  SPE Completed with patient and Paulette, mother.     Has patient been referred to the Quitline?: Patient refused referral Tobacco Use: High Risk (06/25/2022)   Patient History    Smoking Tobacco Use: Every Day    Smokeless Tobacco Use: Never    Passive Exposure: Not on file   Patient has been referred for addiction  treatment: Yes referred to [facility] for outpatient mental health and substance use counseling. Clinic operates on a new patient walk in clinic, unable to obtain appointment. Information for clinic to include hours of operation noted in discharge instructions, see above.  Social History   Substance and Sexual Activity  Drug Use Yes   Frequency: 7.0 times per week   Types: Marijuana, Heroin, Fentanyl   Comment: daily use - ''.6 grams fentanyl snorting''   Social History   Substance and Sexual Activity  Alcohol Use Yes   Alcohol/week: 6.0 standard drinks of alcohol   Types: 3 Shots of liquor, 3 Glasses of wine per week   Comment: 3 vodkas weekly; wine 3 x week   Durenda Hurt, LCSWA 06/30/2022, 9:24 AM

## 2022-06-30 NOTE — [facility] Suicide Risk Assessment (Cosign Needed Addendum)
Suicide Risk Assessment  Discharge Assessment    [facility] Discharge Suicide Risk Assessment   Principal Problem: MDD (major depressive disorder), recurrent severe, without psychosis (HCC) Discharge Diagnoses: Principal Problem:   MDD (major depressive disorder), recurrent severe, without psychosis (HCC) Active Problems:   Insomnia   Opioid use disorder   Anxiety state  HPI: Kelly Hensley is a 26 yo [place] American female who presented to the [facility] on 10/07 with complaints of worsening depressive symptoms and suicidal ideations with a plan to overdose on Fentanyl in the context of financial stressors, homelessness, Fentanyl abuse & grieving the recent death of her boyfriend.  Patient was transferred voluntarily to this [facility] for treatment and stabilization of her mood.                                           HOSPITAL COURSE During the patient's hospitalization, patient had extensive initial psychiatric evaluation, and follow-up psychiatric evaluations every day. Psychiatric diagnoses provided upon initial assessment were as follows:   Principal Problem:   MDD (major depressive disorder), recurrent severe, without psychosis (HCC) Active Problems:   Insomnia   Opioid use disorder   Anxiety state Patient's psychiatric medications were adjusted on admission as follows: MDD -Started Wellbutrin XL 150 mg daily for depressive symptoms   Anxiety -Started Hydroxyzine 25 mg every 6 hours PRN   Insomnia -Started Trazodone 50 mg nightly PRN   Opoid use disorder -Started Clonidine detox protocol  During the hospitalization, other adjustments were made to the patient's psychiatric medication regimen, with discharge meds being as follows:   MDD -Discontinued Wellbutrin XL 150 mg as this might not be effective in management of depressive symptoms in the context of Opioid use d/o and anxiety -Continue Lexapro 15 mg daily for depressive symptoms   Anxiety -Continue Hydroxyzine 25 mg every 6  hours PRN   Insomnia -Continue Trazodone to 100 mg nightly   Opoid use disorder Educated on cessation of opioid use, not agreeable to inpatient rehab for substance abuse at this time   Vitamin D deficiency -Continue Vitamin D3 1000 units daily  Nicotine Dependence -Continue Nicotine gum PRN   Patient's care was discussed during the interdisciplinary team meeting every day during the hospitalization. The patient denies having side effects to prescribed psychiatric medication. The patient was evaluated each day by a clinical provider to ascertain response to treatment. Improvement was noted by the patient's report of decreasing symptoms, improved sleep and appetite, affect, medication tolerance, behavior, and participation in unit programming.  Patient was asked each day to complete a self inventory noting mood, mental status, pain, new symptoms, anxiety and concerns.    Symptoms were reported as significantly decreased or resolved completely by discharge. On day of discharge, the patient reports that their mood is stable. The patient denied having suicidal thoughts for more than 48 hours prior to discharge.  Patient denies having homicidal thoughts.  Patient denies having auditory hallucinations.  Patient denies any visual hallucinations or other symptoms of psychosis. The patient was motivated to continue taking medication with a goal of continued improvement in mental health.   The patient reports their target psychiatric symptoms of depression, anxiety and insomnia, and opioid withdrawal symptoms responded well to the psychiatric medications, and the patient reports overall benefit other psychiatric hospitalization. Supportive psychotherapy was provided to the patient. The patient also participated in regular group therapy  while hospitalized. Coping skills, problem solving as well as relaxation therapies were also part of the unit programming.  Labs were reviewed with the patient, and abnormal  results were discussed with the patient.  Total Time spent with patient: 30 minutes  Musculoskeletal: Strength & Muscle Tone: within normal limits Gait & Station: normal Patient leans: N/A  Psychiatric Specialty Exam  Presentation  General Appearance:  Appropriate for Environment; Fairly Groomed  Eye Contact: Good  Speech: Clear and Coherent  Speech Volume: Normal  Handedness: Right   Mood and Affect  Mood: Euthymic  Duration of Depression Symptoms: Greater than two weeks  Affect: Congruent   Thought Process  Thought Processes: Coherent  Descriptions of Associations:Intact  Orientation:Full (Time, Place and Person)  Thought Content:Logical  History of Schizophrenia/Schizoaffective disorder:No data recorded Duration of Psychotic Symptoms:No data recorded Hallucinations:Hallucinations: None  Ideas of Reference:None  Suicidal Thoughts:Suicidal Thoughts: No  Homicidal Thoughts:Homicidal Thoughts: No   Sensorium  Memory: Immediate Good  Judgment: Good  Insight: Good   Executive Functions  Concentration: Good  Attention Span: Good  Recall: Good  Fund of Knowledge: Good  Language: Good  Psychomotor Activity  Psychomotor Activity: Psychomotor Activity: Normal  Assets  Assets: Communication Skills  Sleep  Sleep: Sleep: Good  Physical Exam: Physical Exam Constitutional:      Appearance: Normal appearance.  HENT:     Head: Normocephalic.     Nose: Nose normal. No congestion or rhinorrhea.  Eyes:     Pupils: Pupils are equal, round, and reactive to light.  Pulmonary:     Effort: Pulmonary effort is normal.  Musculoskeletal:        General: Normal range of motion.     Cervical back: Normal range of motion.  Neurological:     Mental Status: She is alert and oriented to person, place, and time.  Psychiatric:        Behavior: Behavior normal.        Thought Content: Thought content normal.    Review of Systems   Constitutional: Negative.  Negative for fever.  HENT: Negative.    Eyes: Negative.   Respiratory: Negative.    Cardiovascular: Negative.   Gastrointestinal: Negative.   Genitourinary: Negative.   Musculoskeletal: Negative.   Skin: Negative.   Neurological: Negative.   Psychiatric/Behavioral:  Positive for depression (Denies SI/HI/AVH, denies paranoia and verbally contracts for safety outside of this [facility]. Depressive symptoms have significantly improved since admission) and substance abuse (Educated on cessation, not interested in inpatient rehab for substance use at this time). Negative for hallucinations, memory loss and suicidal ideas. The patient has insomnia (Resolving on current medications). The patient is not nervous/anxious.    Blood pressure 116/75, pulse 88, temperature 98.5 F (36.9 C), temperature source Oral, resp. rate 17, height 5\' 9"  (1.753 m), weight 93.4 kg, SpO2 100 %. Body mass index is 30.42 kg/m.  Mental Status Per Nursing Assessment::   On Admission:  Suicidal ideation indicated by patient, Suicidal ideation indicated by others, Self-harm behaviors, Self-harm thoughts  Demographic Factors:  Low socioeconomic status  Loss Factors: NA  Historical Factors: NA  Risk Reduction Factors:   Responsible for children under 11 years of age  Continued Clinical Symptoms:   Cognitive Features That Contribute To Risk:  None    Suicide Risk:  Mild:  There are no identifiable suicide plans, no associated intent, mild dysphoria and related symptoms, good self-control (both objective and subjective assessment), few other risk factors, and identifiable protective factors, including available  and accessible social support.   Follow-up Information     [facility]. Go to.   Specialty: Behavioral Health Why: Please call or go to this provider to schedule an assessment, to obtain therapy and medication management services; on a Monday or a  Wednesday morning, arrive by 7:30 am.  Services are provided on a first come, first served basis.  Once you have scheduled and completed your assessment, appointments will be scheduled for therapy and medication management. Contact information: [address] [place] [address] [phone]        [facility] Go to.   Specialty: Professional Counselor Why: Please use the walk-in hours for new patient appointments, Monday through Friday from 8:30am to 12:00pm and from 1:00pm to 2:30pm.  This is for therapy, medication management, and group services. Contact information: [facility] [place] [address] [place] [address] [phone]                Plan Of Care/Follow-up recommendations:  The patient is able to verbalize their individual safety plan to this provider.  # It is recommended to the patient to continue psychiatric medications as prescribed, after discharge from the hospital.    # It is recommended to the patient to follow up with your outpatient psychiatric provider and PCP.  # It was discussed with the patient, the impact of alcohol, drugs, tobacco have been there overall psychiatric and medical wellbeing, and total abstinence from substance use was recommended the patient.ed.  # Prescriptions provided or sent directly to preferred pharmacy at discharge. Patient agreeable to plan. Given opportunity to ask questions. Appears to feel comfortable with discharge.    # In the event of worsening symptoms, the patient is instructed to call the crisis hotline (988), 911 and or go to the nearest ED for appropriate evaluation and treatment of symptoms. To follow-up with primary care provider for other medical issues, concerns and or health care needs  # Patient was discharged home to her mother's house with a plan to follow up as noted above.   Starleen Blue, NP 06/30/2022, 10:03 AM

## 2022-06-30 NOTE — Progress Notes (Signed)
Patient discharged from [facility] on 06/30/2022 at 12:00pm. Patient denies SI/HI/AVH upon discharge. Patient rates her depression a 0/10 and her anxiety a 6/10. Patient is alert, oriented, and cooperative. RN provided patient with discharge paperwork and reviewed information with patient. Patient expressed that she understood all of the discharge instructions. Pt was satisfied with belongings returned to her from the locker and at bedside. Discharged patient to [facility] waiting room. Pt is getting picked up by a taxi, taxi voucher provided to pt.    06/30/22 1028  Psych Admission Type (Psych Patients Only)  Admission Status Voluntary  Psychosocial Assessment  Patient Complaints Anxiety  Eye Contact Fair  Facial Expression Anxious  Affect Anxious;Depressed  Speech Logical/coherent  Interaction Assertive  Motor Activity Slow  Appearance/Hygiene Unremarkable  Behavior Characteristics Cooperative;Appropriate to situation  Mood Anxious;Pleasant  Thought Process  Coherency WDL  Content WDL  Delusions WDL  Perception WDL  Hallucination None reported or observed  Judgment WDL  Confusion None  Danger to Self  Current suicidal ideation? Denies  Agreement Not to Harm Self Yes  Description of Agreement Verbal  Danger to Others  Danger to Others None reported or observed

## 2022-06-30 NOTE — Group Note (Signed)
Date:  06/30/2022 Time:  11:41 AM  Group Topic/Focus:  Goals Group:   The focus of this group is to help patients establish daily goals to achieve during treatment and discuss how the patient can incorporate goal setting into their daily lives to aide in recovery.    Participation Level:  Active  Participation Quality:  Appropriate  Affect:  Appropriate  Cognitive:  Appropriate  Insight: Good  Engagement in Group:  Engaged  Modes of Intervention:  Discussion  Additional Comments:     Jerrye Beavers 06/30/2022, 11:41 AM

## 2022-09-20 ENCOUNTER — Encounter (HOSPITAL_COMMUNITY): Payer: Self-pay

## 2022-09-20 ENCOUNTER — Ambulatory Visit (HOSPITAL_COMMUNITY)
Admission: EM | Admit: 2022-09-20 | Discharge: 2022-09-20 | Disposition: A | Discharge status: Home / Self Care | Payer: Medicaid Other | Attending: Family Medicine | Admitting: Family Medicine

## 2022-09-20 DIAGNOSIS — Z20822 Contact with and (suspected) exposure to covid-19: Secondary | ICD-10-CM
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
## 2022-09-20 NOTE — Discharge Instructions (Signed)
  You have been swabbed for COVID, and the test will result in the next 24 hours. Our staff will call you if positive. If the COVID test is positive, you should quarantine for 5 days from the start of your symptoms  You can take next as needed for any congestion that you still have.

## 2022-09-20 NOTE — ED Triage Notes (Signed)
Pt is here for covid testing due to exposure x few days ago from a relative

## 2022-09-21 LAB — SARS CORONAVIRUS 2 (TAT 6-24 HRS): SARS Coronavirus 2: NEGATIVE

## 2022-11-09 ENCOUNTER — Inpatient Hospital Stay (HOSPITAL_COMMUNITY)
Admission: EM | Admit: 2022-11-09 | Discharge: 2022-11-11 | DRG: 917 | Disposition: A | Discharge status: Home / Self Care | Payer: Medicaid Other | Attending: Internal Medicine | Admitting: Internal Medicine

## 2022-11-09 ENCOUNTER — Emergency Department (HOSPITAL_COMMUNITY): Payer: Medicaid Other

## 2022-11-09 ENCOUNTER — Other Ambulatory Visit: Payer: Self-pay

## 2022-11-09 ENCOUNTER — Encounter (HOSPITAL_COMMUNITY): Payer: Self-pay

## 2022-11-09 DIAGNOSIS — T40602A Poisoning by unspecified narcotics, intentional self-harm, initial encounter: Secondary | ICD-10-CM | POA: Diagnosis not present

## 2022-11-09 DIAGNOSIS — E876 Hypokalemia: Secondary | ICD-10-CM

## 2022-11-09 DIAGNOSIS — T402X1A Poisoning by other opioids, accidental (unintentional), initial encounter: Principal | ICD-10-CM | POA: Diagnosis present

## 2022-11-09 DIAGNOSIS — N760 Acute vaginitis: Secondary | ICD-10-CM | POA: Diagnosis present

## 2022-11-09 DIAGNOSIS — Z833 Family history of diabetes mellitus: Secondary | ICD-10-CM

## 2022-11-09 DIAGNOSIS — J9600 Acute respiratory failure, unspecified whether with hypoxia or hypercapnia: Secondary | ICD-10-CM | POA: Insufficient documentation

## 2022-11-09 DIAGNOSIS — F1910 Other psychoactive substance abuse, uncomplicated: Secondary | ICD-10-CM | POA: Diagnosis present

## 2022-11-09 DIAGNOSIS — J690 Pneumonitis due to inhalation of food and vomit: Secondary | ICD-10-CM

## 2022-11-09 DIAGNOSIS — Z832 Family history of diseases of the blood and blood-forming organs and certain disorders involving the immune mechanism: Secondary | ICD-10-CM

## 2022-11-09 DIAGNOSIS — F329 Major depressive disorder, single episode, unspecified: Secondary | ICD-10-CM | POA: Diagnosis present

## 2022-11-09 DIAGNOSIS — J9601 Acute respiratory failure with hypoxia: Secondary | ICD-10-CM | POA: Diagnosis present

## 2022-11-09 DIAGNOSIS — T40604A Poisoning by unspecified narcotics, undetermined, initial encounter: Secondary | ICD-10-CM

## 2022-11-09 DIAGNOSIS — Z79891 Long term (current) use of opiate analgesic: Secondary | ICD-10-CM

## 2022-11-09 DIAGNOSIS — Z1152 Encounter for screening for COVID-19: Secondary | ICD-10-CM

## 2022-11-09 DIAGNOSIS — T43506A Underdosing of unspecified antipsychotics and neuroleptics, initial encounter: Secondary | ICD-10-CM | POA: Diagnosis present

## 2022-11-09 DIAGNOSIS — F1010 Alcohol abuse, uncomplicated: Secondary | ICD-10-CM | POA: Diagnosis present

## 2022-11-09 DIAGNOSIS — F419 Anxiety disorder, unspecified: Secondary | ICD-10-CM | POA: Diagnosis present

## 2022-11-09 DIAGNOSIS — D573 Sickle-cell trait: Secondary | ICD-10-CM | POA: Diagnosis present

## 2022-11-09 DIAGNOSIS — T40601A Poisoning by unspecified narcotics, accidental (unintentional), initial encounter: Secondary | ICD-10-CM | POA: Diagnosis present

## 2022-11-09 DIAGNOSIS — Z9151 Personal history of suicidal behavior: Secondary | ICD-10-CM

## 2022-11-09 DIAGNOSIS — E669 Obesity, unspecified: Secondary | ICD-10-CM | POA: Diagnosis present

## 2022-11-09 DIAGNOSIS — Z793 Long term (current) use of hormonal contraceptives: Secondary | ICD-10-CM

## 2022-11-09 DIAGNOSIS — F332 Major depressive disorder, recurrent severe without psychotic features: Secondary | ICD-10-CM

## 2022-11-09 DIAGNOSIS — Z634 Disappearance and death of family member: Secondary | ICD-10-CM

## 2022-11-09 DIAGNOSIS — Z79899 Other long term (current) drug therapy: Secondary | ICD-10-CM

## 2022-11-09 DIAGNOSIS — F17210 Nicotine dependence, cigarettes, uncomplicated: Secondary | ICD-10-CM | POA: Diagnosis present

## 2022-11-09 DIAGNOSIS — Z9189 Other specified personal risk factors, not elsewhere classified: Secondary | ICD-10-CM

## 2022-11-09 DIAGNOSIS — Z6837 Body mass index (BMI) 37.0-37.9, adult: Secondary | ICD-10-CM

## 2022-11-09 DIAGNOSIS — T1491XA Suicide attempt, initial encounter: Secondary | ICD-10-CM

## 2022-11-09 DIAGNOSIS — Z5900 Homelessness unspecified: Secondary | ICD-10-CM

## 2022-11-09 HISTORY — DX: Hypokalemia: E87.6

## 2022-11-09 HISTORY — DX: Acute respiratory failure, unspecified whether with hypoxia or hypercapnia: J96.00

## 2022-11-09 HISTORY — DX: Other specified personal risk factors, not elsewhere classified: Z91.89

## 2022-11-09 HISTORY — DX: Personal history of suicidal behavior: Z91.51

## 2022-11-09 HISTORY — DX: Pneumonitis due to inhalation of food and vomit: J69.0

## 2022-11-09 LAB — CBC WITH DIFFERENTIAL/PLATELET
Abs Immature Granulocytes: 0.13 10*3/uL — ABNORMAL HIGH (ref 0.00–0.07)
Basophils Absolute: 0 10*3/uL (ref 0.0–0.1)
Basophils Relative: 0 %
Eosinophils Absolute: 0.1 10*3/uL (ref 0.0–0.5)
Eosinophils Relative: 1 %
HCT: 38.3 % (ref 36.0–46.0)
Hemoglobin: 12.9 g/dL (ref 12.0–15.0)
Immature Granulocytes: 1 %
Lymphocytes Relative: 33 %
Lymphs Abs: 3.1 10*3/uL (ref 0.7–4.0)
MCH: 25.4 pg — ABNORMAL LOW (ref 26.0–34.0)
MCHC: 33.7 g/dL (ref 30.0–36.0)
MCV: 75.4 fL — ABNORMAL LOW (ref 80.0–100.0)
Monocytes Absolute: 0.4 10*3/uL (ref 0.1–1.0)
Monocytes Relative: 5 %
Neutro Abs: 5.7 10*3/uL (ref 1.7–7.7)
Neutrophils Relative %: 60 %
Platelets: 361 10*3/uL (ref 150–400)
RBC: 5.08 MIL/uL (ref 3.87–5.11)
RDW: 13.2 % (ref 11.5–15.5)
WBC: 9.5 10*3/uL (ref 4.0–10.5)
nRBC: 0 % (ref 0.0–0.2)

## 2022-11-09 LAB — I-STAT BETA HCG BLOOD, ED (MC, WL, AP ONLY): I-stat hCG, quantitative: <5 m[IU]/mL (ref <5)

## 2022-11-09 LAB — COMPREHENSIVE METABOLIC PANEL
ALT: 16 U/L (ref 0–44)
AST: 15 U/L (ref 15–41)
Albumin: 3.1 g/dL — ABNORMAL LOW (ref 3.5–5.0)
Alkaline Phosphatase: 38 U/L (ref 38–126)
Anion gap: 8 (ref 5–15)
BUN: 16 mg/dL (ref 6–20)
CO2: 24 mmol/L (ref 22–32)
Calcium: 7.8 mg/dL — ABNORMAL LOW (ref 8.9–10.3)
Chloride: 106 mmol/L (ref 98–111)
Creatinine, Ser: 1.06 mg/dL — ABNORMAL HIGH (ref 0.44–1.00)
GFR, Estimated: >60 mL/min (ref >60)
Glucose, Bld: 101 mg/dL — ABNORMAL HIGH (ref 70–99)
Potassium: 3.3 mmol/L — ABNORMAL LOW (ref 3.5–5.1)
Sodium: 138 mmol/L (ref 135–145)
Total Bilirubin: 0.4 mg/dL (ref 0.3–1.2)
Total Protein: 5.8 g/dL — ABNORMAL LOW (ref 6.5–8.1)

## 2022-11-09 LAB — ETHANOL: Alcohol, Ethyl (B): <10 mg/dL (ref <10)

## 2022-11-09 LAB — CBG MONITORING, ED: Glucose-Capillary: 168 mg/dL — ABNORMAL HIGH (ref 70–99)

## 2022-11-09 LAB — ACETAMINOPHEN LEVEL: Acetaminophen (Tylenol), Serum: <10 ug/mL — ABNORMAL LOW (ref 10–30)

## 2022-11-09 LAB — SALICYLATE LEVEL: Salicylate Lvl: <7 mg/dL — ABNORMAL LOW (ref 7.0–30.0)

## 2022-11-09 LAB — RESP PANEL BY RT-PCR (RSV, FLU A&B, COVID)  RVPGX2
Influenza A by PCR: NEGATIVE
Influenza B by PCR: NEGATIVE
Resp Syncytial Virus by PCR: NEGATIVE
SARS Coronavirus 2 by RT PCR: NEGATIVE

## 2022-11-09 LAB — BRAIN NATRIURETIC PEPTIDE: B Natriuretic Peptide: 10.8 pg/mL (ref 0.0–100.0)

## 2022-11-09 MED ORDER — SODIUM CHLORIDE 0.9 % IV SOLN
3.0000 g | Freq: Four times a day (QID) | INTRAVENOUS | Status: DC
  Administered 2022-11-09 – 2022-11-11 (×8): 3 g via INTRAVENOUS
  Filled 2022-11-09 (×8): qty 8

## 2022-11-09 MED ORDER — ENOXAPARIN SODIUM 40 MG/0.4ML IJ SOSY: 40.0000 mg | PREFILLED_SYRINGE | INTRAMUSCULAR | Status: DC

## 2022-11-09 MED ORDER — NALOXONE HCL 4 MG/10ML IJ SOLN
0.1250 mg/h | INTRAVENOUS | Status: AC
  Administered 2022-11-09: 0.25 mg/h via INTRAVENOUS
  Filled 2022-11-09 (×3): qty 10

## 2022-11-09 MED ORDER — NALOXONE HCL 0.4 MG/ML IJ SOLN
0.4000 mg | Freq: Once | INTRAMUSCULAR | Status: AC
  Administered 2022-11-09: 0.4 mg via INTRAVENOUS
  Filled 2022-11-09: qty 1

## 2022-11-09 MED ORDER — ORAL CARE MOUTH RINSE
15.0000 mL | OROMUCOSAL | Status: DC
  Administered 2022-11-10 – 2022-11-11 (×5): 15 mL via OROMUCOSAL

## 2022-11-09 MED ORDER — CHLORHEXIDINE GLUCONATE CLOTH 2 % EX PADS
6.0000 | MEDICATED_PAD | Freq: Every day | CUTANEOUS | Status: DC
  Administered 2022-11-10: 6 via TOPICAL

## 2022-11-09 MED ORDER — SODIUM CHLORIDE 0.9 % IV BOLUS
500.0000 mL | Freq: Once | INTRAVENOUS | Status: AC
  Administered 2022-11-09: 500 mL via INTRAVENOUS

## 2022-11-09 MED ORDER — ACETAMINOPHEN 650 MG RE SUPP: 650.0000 mg | Freq: Four times a day (QID) | RECTAL | Status: DC | PRN

## 2022-11-09 MED ORDER — ACETAMINOPHEN 325 MG PO TABS
650.0000 mg | ORAL_TABLET | Freq: Four times a day (QID) | ORAL | Status: DC | PRN
  Administered 2022-11-10 – 2022-11-11 (×3): 650 mg via ORAL
  Filled 2022-11-09 (×3): qty 2

## 2022-11-09 MED ORDER — ORAL CARE MOUTH RINSE: 15.0000 mL | OROMUCOSAL | Status: DC | PRN

## 2022-11-09 MED ORDER — POTASSIUM CHLORIDE 10 MEQ/100ML IV SOLN
10.0000 meq | INTRAVENOUS | Status: DC
  Administered 2022-11-10: 10 meq via INTRAVENOUS
  Filled 2022-11-09: qty 100

## 2022-11-09 NOTE — H&P (Signed)
History and Physical    Kelly Hensley [id number] DOB: 1996-08-28 DOA: 11/09/2022  PCP: Pcp, No  Patient coming from: Home  Chief Complaint: Unresponsive  HPI: Kelly Hensley is a 27 y.o. female with medical history significant of depression, anxiety, polysubstance abuse (heroin, fentanyl, marijuana, tobacco), morbid obesity, sickle cell trait presented to the ED for opiate overdose.  She was admitted to behavioral health back in October 2023 due to worsening depressive symptoms and suicidal ideations with plan to overdose on fentanyl.  Today patient was found unresponsive after taking a friend's Percocet 10 mg tablets.  Her oxygen saturation was 30% on EMS arrival and she was given 1 mg intranasal Narcan with some partial awakening.  Placed on nonrebreather.  In the ED, patient remained drowsy despite Narcan 0.5 mg x 3 and was subsequently placed on Narcan drip.  Blood pressure soft and improved after 500 cc IV fluids.  Afebrile.  Labs showing no leukocytosis, potassium 3.3, glucose 101, creatinine 1.0, calcium 7.8, albumin 3.1, normal LFTs, UDS pending, salicylate level [id number], blood ethanol level <10, acetaminophen level <10, COVID/influenza/RSV PCR pending.  Chest x-ray showing hazy perihilar opacities concerning for developing infiltrate/aspiration.  Mild central pulmonary edema not excluded.  Started on Unasyn.  TRH called to admit.  Patient resting comfortably.  States she is here because she stopped breathing.  States she took 2 tablets of either oxycodone or Percocet 10 mg which she received from someone else.  She denies any other drug use.  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  She denies suicidal ideation.  However, soon after started crying and told me she feels very depressed since after her kids father passed away back in Apr 01, 2023.  She is also having difficulty with housing and taking care of her kids.  She is currently  not taking any of her home psych meds.  She denies history of prior suicide attempts.  No other complaints.  Review of Systems:  Review of Systems  All other systems reviewed and are negative.   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 She has a 10.00 pack-year smoking history. She has never used smokeless tobacco. She reports current alcohol use of about 6.0 standard drinks of alcohol per week. She reports current drug use. Frequency: 7.00 times per week. Drugs: Marijuana, Heroin, and Fentanyl.  No Known Allergies  Family History  Problem Relation Age of Onset   Diabetes Mother    Sickle cell anemia Father     Prior to Admission medications   Medication Sig Start Date End Date Taking? Authorizing Provider  escitalopram (LEXAPRO) 5 MG tablet Take 3 tablets (15 mg total) by mouth daily. 07/01/22 11/09/22  Massengill, Ovid Curd, MD  hydrOXYzine (ATARAX) 25 MG tablet Take 1 tablet (25 mg total) by mouth 3 (three) times daily as needed for up to 10 doses for anxiety. 06/30/22   Massengill, Ovid Curd, MD  nicotine (NICODERM CQ - DOSED IN MG/24 HOURS) 14 mg/24hr  patch Place 1 patch (14 mg total) onto the skin daily. (May buy from over the counter): For smoking 01/14/21   Lindell Spar I, NP  nicotine polacrilex (NICORETTE) 2 MG gum Take 1 each (2 mg total) by mouth as needed for smoking cessation. 06/30/22   Massengill, Ovid Curd, MD  traZODone (DESYREL) 100 MG tablet Take 1 tablet (100 mg total) by mouth at bedtime. 06/30/22 11/09/22  Massengill, Ovid Curd, MD  norgestimate-ethinyl estradiol (ORTHO-CYCLEN,SPRINTEC,PREVIFEM) 0.25-35 MG-MCG tablet Take 1 tablet by mouth  daily. Patient not taking: Reported on 03/27/2020 09/27/18 03/27/20  Manya Silvas, CNM    Physical Exam: Vitals:   11/09/22 1955 11/09/22 2001 11/09/22 2030 11/09/22 2054  BP:   96/68   Pulse: 81 83 88 93  Resp: 10 13 13 16  $ Temp:      TempSrc:      SpO2: 93% 95% 91% (!) 88%    Physical Exam Vitals reviewed.  Constitutional:      General: She is not in acute distress. HENT:     Head: Normocephalic and atraumatic.  Eyes:     Extraocular Movements: Extraocular movements intact.  Cardiovascular:     Rate and Rhythm: Normal rate and regular rhythm.     Pulses: Normal pulses.  Pulmonary:     Effort: Pulmonary effort is normal. No respiratory distress.     Breath sounds: No wheezing or rales.  Abdominal:     General: Bowel sounds are normal. There is no distension.     Palpations: Abdomen is soft.     Tenderness: There is no abdominal tenderness.  Musculoskeletal:     Cervical back: Normal range of motion.     Right lower leg: No edema.     Left lower leg: No edema.  Skin:    General: Skin is warm and dry.  Neurological:     General: No focal deficit present.     Mental Status: She is alert and oriented to person, place, and time.  Psychiatric:     Comments: Depressed affect, crying     Labs on Admission: I have personally reviewed following labs and imaging studies  CBC: Recent Labs  Lab 11/09/22 1932  WBC 9.5  NEUTROABS 5.7  HGB 12.9  HCT 38.3  MCV 75.4*  PLT [id number]   Basic Metabolic Panel: Recent Labs  Lab 11/09/22 2036  NA 138  K 3.3*  CL 106  CO2 24  GLUCOSE 101*  BUN 16  CREATININE 1.06*  CALCIUM 7.8*   GFR: CrCl cannot be calculated (Unknown ideal weight.). Liver Function Tests: Recent Labs  Lab 11/09/22 2036  AST 15  ALT 16  ALKPHOS 38  BILITOT 0.4  PROT 5.8*  ALBUMIN 3.1*   No results for input(s): "LIPASE", "AMYLASE" in the last 168 hours. No results for input(s): "AMMONIA" in the last 168 hours. Coagulation Profile: No results  for input(s): "INR", "PROTIME" in the last 168 hours. Cardiac Enzymes: No results for input(s): "CKTOTAL", "CKMB", "CKMBINDEX", "TROPONINI" in the last 168 hours. BNP (last 3 results) No results for input(s): "PROBNP" in the last 8760 hours. HbA1C: No results for input(s): "HGBA1C" in the last 72 hours. CBG: Recent Labs  Lab 11/09/22 1928  GLUCAP 168*   Lipid Profile: No results for input(s): "CHOL", "HDL", "LDLCALC", "TRIG", "CHOLHDL", "LDLDIRECT" in the last 72 hours. Thyroid Function Tests: No results for input(s): "TSH", "T4TOTAL", "FREET4", "T3FREE", "THYROIDAB" in the last 72 hours. Anemia Panel: No results for input(s): "VITAMINB12", "FOLATE", "FERRITIN", "TIBC", "IRON", "RETICCTPCT" in  the last 72 hours. Urine analysis:    Component Value Date/Time   COLORURINE YELLOW 06/26/2022 1757   APPEARANCEUR HAZY (A) 06/26/2022 1757   LABSPEC 1.034 (H) 06/26/2022 1757   PHURINE 5.0 06/26/2022 1757   GLUCOSEU NEGATIVE 06/26/2022 1757   HGBUR MODERATE (A) 06/26/2022 1757   BILIRUBINUR SMALL (A) 06/26/2022 1757   KETONESUR 20 (A) 06/26/2022 1757   PROTEINUR 30 (A) 06/26/2022 1757   UROBILINOGEN 1.0 08/01/2018 1534   NITRITE NEGATIVE 06/26/2022 1757   LEUKOCYTESUR NEGATIVE 06/26/2022 1757    Radiological Exams on Admission: DG Chest Portable 1 View  Result Date: 11/09/2022 CLINICAL DATA:  Overdose.  Hypoxia. EXAM: PORTABLE CHEST 1 VIEW COMPARISON:  None Available. FINDINGS: Hazy opacity in the perihilar regions bilaterally. The heart, hila, and mediastinum are normal. No pneumothorax. No nodules or masses. No focal infiltrates. IMPRESSION: Hazy perihilar opacities could represent developing infiltrate such as aspiration given history. Mild central pulmonary edema not excluded. Recommend clinical correlation. Electronically Signed   By: Dorise Bullion III M.D.   On: 11/09/2022 19:47    EKG: Independently reviewed.  Sinus rhythm, no acute abnormality.  Assessment and  Plan  Opiate overdose, likely intentional Concern for suicide attempt in the setting of major depressive disorder Patient found unresponsive by EMS with oxygen saturation 30% after she reportedly took either oxycodone or Percocet 10 mg tablets she received from someone else.  Received multiple doses of Narcan but remained drowsy, subsequently started on Narcan drip in the ED.  Patient is currently awake and alert on Narcan drip, answering questions.  Satting well on 6 L supplemental oxygen.  She is endorsing severe depression due to several life stressors and crying at the time of my evaluation.  Per review of chart, she was previously admitted to behavioral health October 2023 due to worsening depressive symptoms and suicidal ideations with plan to overdose on fentanyl.  Plan: Admit to stepdown unit for very close monitoring.  Continue Narcan drip.  Continuous pulse ox, supplemental oxygen.  I have placed psychiatry consult.  Suicide precautions.  UDS pending.  Aspiration pneumonia Chest x-ray showing hazy perihilar opacities concerning for developing infiltrate/aspiration in the setting of opiate overdose/unresponsiveness.  Pulmonary edema less likely given no history of CHF and she does not appear volume overloaded on exam.  Patient is currently stable on 6 L supplemental oxygen.  No fever, leukocytosis, or signs of sepsis.  COVID/influenza/RSV PCR negative.  Plan: Continue Unasyn and supplemental oxygen.  Check BNP.  Keep n.p.o. at this time, SLP eval, aspiration precautions.  Acute hypoxemic respiratory failure Secondary to opiate overdose and aspiration pneumonia.  Currently stable on 6 L supplemental oxygen.  Plan: Continue supplemental oxygen, Narcan drip, and antibiotic.  Monitor closely on stepdown unit.  Mild hypokalemia -Monitor potassium and magnesium levels, continue to replace as needed.  DVT prophylaxis: SCDs Code Status: Full Code Family Communication: No family available at this  time. Level of care: Step Down Unit Admission status: It is my clinical opinion that referral for OBSERVATION is reasonable and necessary in this patient based on the above information provided. The aforementioned taken together are felt to place the patient at high risk for further clinical deterioration. However, it is anticipated that the patient may be medically stable for discharge from the hospital within 24 to 48 hours.   Shela Leff MD [facility]  If 7PM-7AM, please contact night-coverage [email or web address]  11/09/2022, 9:45 PM

## 2022-11-09 NOTE — Progress Notes (Signed)
Pharmacy Antibiotic Note  Kelly Hensley is a 27 y.o. female admitted on 11/09/2022 found unresponsive after taking a friend's pain meds..  Pharmacy has been consulted to dose unasyn for asp pna  Plan: Unasyn 3gm IV q6h Follow renal function and clinical course     Temp (24hrs), Avg:98 F (36.7 C), Min:98 F (36.7 C), Max:98 F (36.7 C)  Recent Labs  Lab 11/09/22 1932 11/09/22 2036  WBC 9.5  --   CREATININE  --  1.06*    CrCl cannot be calculated (Unknown ideal weight.).    No Known Allergies   Thank you for allowing pharmacy to be a part of this patient's care.  Dolly Rias RPh 11/09/2022, 9:54 PM

## 2022-11-09 NOTE — ED Provider Notes (Signed)
[facility] [facility] Provider Note   CSN: [id number] Arrival date & time: 11/09/22  1902     History  Chief Complaint  Patient presents with   Drug Overdose    Kelly Hensley is a 27 y.o. female.  HPI 27 year old female presents after an overdose of narcotics.  History is primarily from the nurse who spoke to EMS but a little bit from the patient.  At some point this afternoon/evening she took Percocet which she got from a friend.  She tells me they were 10 mg tablets and she took 2.  She states this was due to knee and back pain and she was not trying to hurt herself.  However she was apparently unresponsive and so friends called EMS.  Their initial O2 sats were in the 30s.  She was given 1 mg intranasal Narcan with some partial awakening but she is still sleepy and when I am seeing her is on a nonrebreather.  The nurse reports to me that she accidentally got disconnected from oxygen when switching her over from the EMS stretcher to the bed and she dropped down into the 70s and would not come up with a nasal cannula.  Otherwise the patient is pretty somnolent and I am unable to get a good history.  Home Medications Prior to Admission medications   Medication Sig Start Date End Date Taking? Authorizing Provider  escitalopram (LEXAPRO) 5 MG tablet Take 3 tablets (15 mg total) by mouth daily. 07/01/22 11/09/22  Massengill, Ovid Curd, MD  hydrOXYzine (ATARAX) 25 MG tablet Take 1 tablet (25 mg total) by mouth 3 (three) times daily as needed for up to 10 doses for anxiety. 06/30/22   Massengill, Ovid Curd, MD  nicotine (NICODERM CQ - DOSED IN MG/24 HOURS) 14 mg/24hr patch Place 1 patch (14 mg total) onto the skin daily. (May buy from over the counter): For smoking 01/14/21   Lindell Spar I, NP  nicotine polacrilex (NICORETTE) 2 MG gum Take 1 each (2 mg total) by mouth as needed for smoking cessation. 06/30/22   Massengill, Ovid Curd, MD  traZODone (DESYREL) 100 MG tablet  Take 1 tablet (100 mg total) by mouth at bedtime. 06/30/22 11/09/22  Massengill, Ovid Curd, MD  norgestimate-ethinyl estradiol (ORTHO-CYCLEN,SPRINTEC,PREVIFEM) 0.25-35 MG-MCG tablet Take 1 tablet by mouth daily. Patient not taking: Reported on 03/27/2020 09/27/18 03/27/20  Manya Silvas, Josephine      Allergies    Patient has no known allergies.    Review of Systems   Review of Systems  Unable to perform ROS: Mental status change    Physical Exam Updated Vital Signs BP 108/61   Pulse 98   Temp 98 F (36.7 C) (Axillary)   Resp 15   SpO2 96%  Physical Exam Vitals and nursing note reviewed.  Constitutional:      Appearance: She is well-developed.  HENT:     Head: Normocephalic and atraumatic.  Cardiovascular:     Rate and Rhythm: Normal rate and regular rhythm.     Heart sounds: Normal heart sounds.  Pulmonary:     Effort: Pulmonary effort is normal.     Breath sounds: Normal breath sounds.  Abdominal:     Palpations: Abdomen is soft.     Tenderness: There is no abdominal tenderness.  Skin:    General: Skin is warm and dry.  Neurological:     Mental Status: She is lethargic.     Comments: Patient is somnolent.  She will wake up to  voice and light stimulation but seems to fall asleep even while talking to me.     ED Results / Procedures / Treatments   Labs (all labs ordered are listed, but only abnormal results are displayed) Labs Reviewed  CBC WITH DIFFERENTIAL/PLATELET - Abnormal; Notable for the following components:      Result Value   MCV 75.4 (*)    MCH 25.4 (*)    Abs Immature Granulocytes 0.13 (*)    All other components within normal limits  SALICYLATE LEVEL - Abnormal; Notable for the following components:   Salicylate Lvl Q000111Q (*)    All other components within normal limits  ACETAMINOPHEN LEVEL - Abnormal; Notable for the following components:   Acetaminophen (Tylenol), Serum <10 (*)    All other components within normal limits  COMPREHENSIVE METABOLIC PANEL  - Abnormal; Notable for the following components:   Potassium 3.3 (*)    Glucose, Bld 101 (*)    Creatinine, Ser 1.06 (*)    Calcium 7.8 (*)    Total Protein 5.8 (*)    Albumin 3.1 (*)    All other components within normal limits  CBG MONITORING, ED - Abnormal; Notable for the following components:   Glucose-Capillary 168 (*)    All other components within normal limits  RESP PANEL BY RT-PCR (RSV, FLU A&B, COVID)  RVPGX2  ETHANOL  RAPID URINE DRUG SCREEN, HOSP PERFORMED  HIV ANTIBODY (ROUTINE TESTING W REFLEX)  BASIC METABOLIC PANEL  BRAIN NATRIURETIC PEPTIDE  MAGNESIUM  I-STAT BETA HCG BLOOD, ED ([facility], [facility], [facility] ONLY)    EKG EKG Interpretation  Date/Time:  Thursday November 09 2022 19:14:07 EST Ventricular Rate:  89 PR Interval:  143 QRS Duration: 102 QT Interval:  369 QTC Calculation: 449 R Axis:   73 Text Interpretation: Sinus rhythm no acute ST/T changes Confirmed by Sherwood Gambler [phone]) on 11/09/2022 7:54:03 PM  Radiology DG Chest Portable 1 View  Result Date: 11/09/2022 CLINICAL DATA:  Overdose.  Hypoxia. EXAM: PORTABLE CHEST 1 VIEW COMPARISON:  None Available. FINDINGS: Hazy opacity in the perihilar regions bilaterally. The heart, hila, and mediastinum are normal. No pneumothorax. No nodules or masses. No focal infiltrates. IMPRESSION: Hazy perihilar opacities could represent developing infiltrate such as aspiration given history. Mild central pulmonary edema not excluded. Recommend clinical correlation. Electronically Signed   By: Dorise Bullion III M.D.   On: 11/09/2022 19:47    Procedures .Critical Care  Performed by: Sherwood Gambler, MD Authorized by: Sherwood Gambler, MD   Critical care provider statement:    Critical care time (minutes):  50   Critical care time was exclusive of:  Separately billable procedures and treating other patients   Critical care was necessary to treat or prevent imminent or life-threatening deterioration of the following conditions:   Respiratory failure and CNS failure or compromise   Critical care was time spent personally by me on the following activities:  Development of treatment plan with patient or surrogate, discussions with consultants, evaluation of patient's response to treatment, examination of patient, ordering and review of laboratory studies, ordering and review of radiographic studies, ordering and performing treatments and interventions, pulse oximetry, re-evaluation of patient's condition and review of old charts     Medications Ordered in ED Medications  naloxone HCl (NARCAN) 4 mg in dextrose 5 % 250 mL infusion (0.25 mg/hr Intravenous Rate/Dose Verify 11/09/22 2233)  Ampicillin-Sulbactam (UNASYN) 3 g in sodium chloride 0.9 % 100 mL IVPB (3 g Intravenous New Bag/Given 11/09/22 2233)  enoxaparin (  LOVENOX) injection 40 mg (has no administration in time range)  acetaminophen (TYLENOL) tablet 650 mg (has no administration in time range)    Or  acetaminophen (TYLENOL) suppository 650 mg (has no administration in time range)  potassium chloride 10 mEq in 100 mL IVPB (has no administration in time range)  naloxone [facility]) injection 0.4 mg (0.4 mg Intravenous Given 11/09/22 1925)  naloxone [facility]) injection 0.4 mg (0.4 mg Intravenous Given 11/09/22 2035)  naloxone [facility]) injection 0.4 mg (0.4 mg Intravenous Given 11/09/22 2140)  sodium chloride 0.9 % bolus 500 mL (500 mLs Intravenous New Bag/Given 11/09/22 2200)    ED Course/ Medical Decision Making/ A&P                             Medical Decision Making Amount and/or Complexity of Data Reviewed Labs: ordered.    Details: Normal EtOH, normal WBC.  Mild hypokalemia. Radiology: ordered and independent interpretation performed.    Details: Mild infiltrates, probably edema but hard to fully tell it is not pneumonia. ECG/medicine tests: ordered and independent interpretation performed.    Details: No acute ischemia  Risk Prescription drug  management. Decision regarding hospitalization.   Patient required multiple doses of Narcan.  When she is awake she seems to indicate that she took the Percocet for pain rather than an intentional overdose.  However she does have a history of depression and suicidal thoughts so this will need to be reevaluated when she is more stable.  Each dose of Narcan is able to wake her up though she then has to have repeat stimulation and Narcan.  Thus she will be put on a drip after her third dose.  She is requiring oxygen though is not as bad when she is more awake.  Give a dose of Unasyn though she could have edema from significant hypoxia/respiratory failure.  She is currently on nasal cannula oxygen.  She will need admission and I have discussed with Dr. Marlowe Sax.  When the Narcan is in her system she is awake and protecting her airway and so I do not think she needs BiPAP or intubation.        Final Clinical Impression(s) / ED Diagnoses Final diagnoses:  Acute respiratory failure, unspecified whether with hypoxia or hypercapnia (HCC)  Opiate overdose, undetermined intent, initial encounter [facility])    Rx / DC Orders ED Discharge Orders     None         Sherwood Gambler, MD 11/09/22 2237

## 2022-11-09 NOTE — ED Notes (Signed)
ED TO INPATIENT HANDOFF REPORT  Name/Age/Gender Kelly Hensley 27 y.o. female  Code Status    Code Status Orders  (From admission, onward)           Start     Ordered   11/09/22 2230  Full code  Continuous       Question:  By:  Answer:  Default: patient does not have capacity for decision making, no surrogate or prior directive available   11/09/22 2233           Code Status History     Date Active Date Inactive Code Status Order ID Comments User Context   06/25/2022 1631 06/30/2022 1716 Full Code [id number]  Rozetta Nunnery, NP Inpatient   06/24/2022 2211 06/25/2022 1537 Full Code [id number]  Nettie Elm, PA-C ED   01/10/2021 1543 01/13/2021 1642 Full Code [id number]  Sharma Covert, MD Inpatient   01/08/2021 1856 01/10/2021 1537 Full Code [id number]  Emeline Darling, PA-C ED   04/12/2017 0427 04/13/2017 1655 Full Code [id number]  Aneta Mins, MD Inpatient   04/11/2017 1639 04/12/2017 0427 Full Code [id number]  Shirley, [place], DO Inpatient   03/25/2016 2106 03/27/2016 1442 Full Code [id number]  Aletha Halim, MD Inpatient   03/24/2016 0803 03/25/2016 2105 Full Code [id number]  Nelta Numbers, RN Inpatient       Home/SNF/Other Home  Chief Complaint Opiate overdose [facility]) [T40.601A]  Level of Care/Admitting Diagnosis ED Disposition     ED Disposition  Admit   Condition  --   Comment  Hospital Area: [place] [[id number]]  Level of Care: Stepdown [14]  Admit to SDU based on following criteria: Severe physiological/psychological symptoms:  Any diagnosis requiring assessment & intervention at least every 4 hours on an ongoing basis to obtain desired patient outcomes including stability and rehabilitation  Admit to SDU based on following criteria: Respiratory Distress:  Frequent assessment and/or intervention to maintain adequate ventilation/respiration, pulmonary toilet, and respiratory treatment.  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    Migraines    Morbid obesity ([place])    Sickle cell trait ([place])     Allergies No Known Allergies  IV Location/Drains/Wounds Patient Lines/Drains/Airways Status     Active Line/Drains/Airways     Name Placement date Placement time Site Days   Peripheral IV 11/09/22 20 G 1" Left;Posterior Wrist 11/09/22  1925  Wrist  less than 1   Peripheral IV 11/09/22 22 G 1" Anterior;Right Forearm 11/09/22  2232  Forearm  less than 1   Epidural Catheter 04/12/17 04/12/17  0147  -- 2037            Labs/Imaging Results for orders placed or performed during the hospital encounter of 11/09/22 (from the past 48 hour(s))  CBG monitoring, ED     Status: Abnormal   Collection Time: 11/09/22  7:28 PM  Result Value Ref Range   Glucose-Capillary 168 (H) 70 - 99  mg/dL    Comment: Glucose reference range applies only to samples taken after fasting for at least 8 hours.  CBC with Differential     Status: Abnormal   Collection Time: 11/09/22  7:32 PM  Result Value Ref Range   WBC 9.5 4.0 - 10.5 K/uL   RBC 5.08 3.87 - 5.11 MIL/uL   Hemoglobin 12.9 12.0 - 15.0 g/dL   HCT 38.3 36.0 - 46.0 %   MCV 75.4 (L) 80.0 - 100.0 fL   MCH 25.4 (L) 26.0 - 34.0 pg   MCHC 33.7 30.0 - 36.0 g/dL   RDW 13.2 11.5 - 15.5 %   Platelets 361 150 - 400 K/uL   nRBC 0.0 0.0 - 0.2 %   Neutrophils Relative % 60 %   Neutro Abs 5.7 1.7 - 7.7 K/uL   Lymphocytes Relative 33 %   Lymphs Abs 3.1 0.7 - 4.0 K/uL   Monocytes Relative 5 %    Monocytes Absolute 0.4 0.1 - 1.0 K/uL   Eosinophils Relative 1 %   Eosinophils Absolute 0.1 0.0 - 0.5 K/uL   Basophils Relative 0 %   Basophils Absolute 0.0 0.0 - 0.1 K/uL   Immature Granulocytes 1 %   Abs Immature Granulocytes 0.13 (H) 0.00 - 0.07 K/uL    Comment: Performed at [facility], [place] [address], [place], [place] [address]  I-Stat beta hCG blood, ED     Status: None   Collection Time: 11/09/22  7:35 PM  Result Value Ref Range   I-stat hCG, quantitative <5.0 <5 mIU/mL   Comment 3            Comment:   GEST. AGE      CONC.  (mIU/mL)   <=1 WEEK        5 - 50     2 WEEKS       50 - 500     3 WEEKS       100 - 10,000     4 WEEKS     1,000 - 30,000        FEMALE AND NON-PREGNANT FEMALE:     LESS THAN 5 mIU/mL   Salicylate level     Status: Abnormal   Collection Time: 11/09/22  7:47 PM  Result Value Ref Range   Salicylate Lvl [id number] (L) 7.0 - 30.0 mg/dL    Comment: Performed at [facility], [place] [address], [place], [place] [address]  Ethanol     Status: None   Collection Time: 11/09/22  7:47 PM  Result Value Ref Range   Alcohol, Ethyl (B) <10 <10 mg/dL    Comment: (NOTE) Lowest detectable limit for serum alcohol is 10 mg/dL.  For medical purposes only. Performed at [facility], [place] [address] [place], [place] [address]   Acetaminophen level     Status: Abnormal   Collection Time: 11/09/22  7:47 PM  Result Value Ref Range   Acetaminophen (Tylenol), Serum <10 (L) 10 - 30 ug/mL    Comment: (NOTE) Therapeutic concentrations vary significantly. A range of 10-30 ug/mL  may be an effective concentration for many patients. However, some  are best treated at concentrations outside of this range. Acetaminophen concentrations >150 ug/mL at 4 hours after ingestion  and >50 ug/mL at 12 hours after ingestion are often associated with  toxic reactions.  Performed at [facility], [place] [address] [place], [place] [address]   Comprehensive metabolic panel  Status: Abnormal   Collection Time: 11/09/22  8:36 PM  Result Value Ref Range   Sodium 138 135 - 145 mmol/L   Potassium 3.3 (L) 3.5 - 5.1 mmol/L   Chloride 106 98 - 111 mmol/L   CO2 24 22 - 32 mmol/L   Glucose, Bld 101 (H) 70 - 99 mg/dL    Comment: Glucose reference range applies only to samples taken after fasting for at least 8 hours.   BUN 16 6 - 20 mg/dL   Creatinine, Ser 1.06 (H) 0.44 - 1.00 mg/dL   Calcium 7.8 (L) 8.9 - 10.3 mg/dL   Total Protein 5.8 (L) 6.5 - 8.1 g/dL   Albumin 3.1 (L) 3.5 - 5.0 g/dL   AST 15 15 - 41 U/L   ALT 16 0 - 44 U/L   Alkaline Phosphatase 38 38 - 126 U/L   Total Bilirubin 0.4 0.3 - 1.2 mg/dL   GFR, Estimated >60 >60 mL/min    Comment: (NOTE) Calculated using the CKD-EPI Creatinine Equation (2021)    Anion gap 8 5 - 15    Comment: Performed at [facility], [place] [address], [place], [place] [address]  Resp panel by RT-PCR (RSV, Flu A&B, Covid) Anterior Nasal Swab     Status: None   Collection Time: 11/09/22  8:50 PM   Specimen: Anterior Nasal Swab  Result Value Ref Range   SARS Coronavirus 2 by RT PCR NEGATIVE NEGATIVE    Comment: (NOTE) SARS-CoV-2 target nucleic acids are NOT DETECTED.  The SARS-CoV-2 RNA is generally detectable in upper respiratory specimens during the acute phase of infection. The lowest concentration of SARS-CoV-2 viral copies this assay can detect is 138 copies/mL. A negative result does not preclude SARS-Cov-2 infection and should not be used as the sole basis for treatment or other patient management decisions. A negative result may occur with  improper specimen collection/handling, submission of specimen other than nasopharyngeal swab, presence of viral mutation(s) within the areas targeted by this assay, and inadequate number of viral copies(<138 copies/mL). A negative result must be combined with clinical observations, patient history,  and epidemiological information. The expected result is Negative.  Fact Sheet for Patients:  EntrepreneurPulse.com.au  Fact Sheet for Healthcare Providers:  IncredibleEmployment.be  This test is no t yet approved or cleared by the [place] FDA and  has been authorized for detection and/or diagnosis of SARS-CoV-2 by FDA under an Emergency Use Authorization (EUA). This EUA will remain  in effect (meaning this test can be used) for the duration of the COVID-19 declaration under Section 564(b)(1) of the Act, 21 U.S.C.section 360bbb-3(b)(1), unless the authorization is terminated  or revoked sooner.       Influenza A by PCR NEGATIVE NEGATIVE   Influenza B by PCR NEGATIVE NEGATIVE    Comment: (NOTE) The Xpert Xpress SARS-CoV-2/FLU/RSV plus assay is intended as an aid in the diagnosis of influenza from Nasopharyngeal swab specimens and should not be used as a sole basis for treatment. Nasal washings and aspirates are unacceptable for Xpert Xpress SARS-CoV-2/FLU/RSV testing.  Fact Sheet for Patients: EntrepreneurPulse.com.au  Fact Sheet for Healthcare Providers: IncredibleEmployment.be  This test is not yet approved or cleared by the [place] FDA and has been authorized for detection and/or diagnosis of SARS-CoV-2 by FDA under an Emergency Use Authorization (EUA). This EUA will remain in effect (meaning this test can be used) for the duration of the COVID-19 declaration under Section 564(b)(1) of the Act, 21 U.S.C. section 360bbb-3(b)(1), unless  the authorization is terminated or revoked.     Resp Syncytial Virus by PCR NEGATIVE NEGATIVE    Comment: (NOTE) Fact Sheet for Patients: EntrepreneurPulse.com.au  Fact Sheet for Healthcare Providers: IncredibleEmployment.be  This test is not yet approved or cleared by the [place] FDA and has been authorized for  detection and/or diagnosis of SARS-CoV-2 by FDA under an Emergency Use Authorization (EUA). This EUA will remain in effect (meaning this test can be used) for the duration of the COVID-19 declaration under Section 564(b)(1) of the Act, 21 U.S.C. section 360bbb-3(b)(1), unless the authorization is terminated or revoked.  Performed at [facility], [place] [address], [place], [place] [address]    DG Chest Portable 1 View  Result Date: 11/09/2022 CLINICAL DATA:  Overdose.  Hypoxia. EXAM: PORTABLE CHEST 1 VIEW COMPARISON:  None Available. FINDINGS: Hazy opacity in the perihilar regions bilaterally. The heart, hila, and mediastinum are normal. No pneumothorax. No nodules or masses. No focal infiltrates. IMPRESSION: Hazy perihilar opacities could represent developing infiltrate such as aspiration given history. Mild central pulmonary edema not excluded. Recommend clinical correlation. Electronically Signed   By: Dorise Bullion III M.D.   On: 11/09/2022 19:47    Pending Labs Unresulted Labs (From admission, onward)     Start     Ordered   11/10/22 [id number]  Basic metabolic panel  Tomorrow morning,   R        11/09/22 2233   11/10/22 0500  Magnesium  Tomorrow morning,   R        11/09/22 2233   11/09/22 2233  Brain natriuretic peptide  ONCE - STAT,   STAT        11/09/22 2233   11/09/22 2229  HIV Antibody (routine testing w rflx)  (HIV Antibody (Routine testing w reflex) panel)  Once,   R        11/09/22 2233   11/09/22 1923  Urine rapid drug screen (hosp performed)  Once,   STAT        11/09/22 1922            Vitals/Pain Today's Vitals   11/09/22 2054 11/09/22 2130 11/09/22 2140 11/09/22 2227  BP:  108/61    Pulse: 93 98 96 98  Resp: 16 15 (!) 9 15  Temp:      TempSrc:      SpO2: (!) 88% (!) 84% 93% 96%  PainSc:        Isolation Precautions No active isolations  Medications Medications  naloxone HCl (NARCAN) 4 mg in dextrose 5 % 250 mL infusion (0.25 mg/hr  Intravenous Rate/Dose Verify 11/09/22 2233)  Ampicillin-Sulbactam (UNASYN) 3 g in sodium chloride 0.9 % 100 mL IVPB (3 g Intravenous New Bag/Given 11/09/22 2233)  acetaminophen (TYLENOL) tablet 650 mg (has no administration in time range)    Or  acetaminophen (TYLENOL) suppository 650 mg (has no administration in time range)  potassium chloride 10 mEq in 100 mL IVPB (has no administration in time range)  naloxone [facility]) injection 0.4 mg (0.4 mg Intravenous Given 11/09/22 1925)  naloxone [facility]) injection 0.4 mg (0.4 mg Intravenous Given 11/09/22 2035)  naloxone [facility]) injection 0.4 mg (0.4 mg Intravenous Given 11/09/22 2140)  sodium chloride 0.9 % bolus 500 mL (500 mLs Intravenous New Bag/Given 11/09/22 2200)    Mobility Has not walked since admission.

## 2022-11-09 NOTE — ED Triage Notes (Signed)
Patient BIB EMS. Patient found unresponsive after taking a friend's pain medication. Patient's O2 was 30% on scene. Given 53m narcan IM on scene. EMS reports down for at least 10 minutes. Patient reports she took 2 percocet. Patient drowsy at time of arrival. 78% on RA. Patient placed on a nonrebreather.

## 2022-11-10 DIAGNOSIS — Z79899 Other long term (current) drug therapy: Secondary | ICD-10-CM | POA: Diagnosis not present

## 2022-11-10 DIAGNOSIS — T402X1A Poisoning by other opioids, accidental (unintentional), initial encounter: Secondary | ICD-10-CM | POA: Diagnosis not present

## 2022-11-10 DIAGNOSIS — Z793 Long term (current) use of hormonal contraceptives: Secondary | ICD-10-CM | POA: Diagnosis not present

## 2022-11-10 DIAGNOSIS — Z1152 Encounter for screening for COVID-19: Secondary | ICD-10-CM | POA: Diagnosis not present

## 2022-11-10 DIAGNOSIS — J9600 Acute respiratory failure, unspecified whether with hypoxia or hypercapnia: Secondary | ICD-10-CM

## 2022-11-10 DIAGNOSIS — N760 Acute vaginitis: Secondary | ICD-10-CM | POA: Diagnosis present

## 2022-11-10 DIAGNOSIS — Z79891 Long term (current) use of opiate analgesic: Secondary | ICD-10-CM | POA: Diagnosis not present

## 2022-11-10 DIAGNOSIS — Z634 Disappearance and death of family member: Secondary | ICD-10-CM | POA: Diagnosis not present

## 2022-11-10 DIAGNOSIS — J690 Pneumonitis due to inhalation of food and vomit: Secondary | ICD-10-CM | POA: Diagnosis present

## 2022-11-10 DIAGNOSIS — J9601 Acute respiratory failure with hypoxia: Secondary | ICD-10-CM | POA: Diagnosis present

## 2022-11-10 DIAGNOSIS — E876 Hypokalemia: Secondary | ICD-10-CM | POA: Diagnosis present

## 2022-11-10 DIAGNOSIS — F339 Major depressive disorder, recurrent, unspecified: Secondary | ICD-10-CM

## 2022-11-10 DIAGNOSIS — F329 Major depressive disorder, single episode, unspecified: Secondary | ICD-10-CM | POA: Diagnosis present

## 2022-11-10 DIAGNOSIS — F419 Anxiety disorder, unspecified: Secondary | ICD-10-CM | POA: Diagnosis present

## 2022-11-10 DIAGNOSIS — T40601A Poisoning by unspecified narcotics, accidental (unintentional), initial encounter: Secondary | ICD-10-CM | POA: Diagnosis not present

## 2022-11-10 DIAGNOSIS — Z5900 Homelessness unspecified: Secondary | ICD-10-CM | POA: Diagnosis not present

## 2022-11-10 DIAGNOSIS — T43506A Underdosing of unspecified antipsychotics and neuroleptics, initial encounter: Secondary | ICD-10-CM | POA: Diagnosis present

## 2022-11-10 DIAGNOSIS — E669 Obesity, unspecified: Secondary | ICD-10-CM | POA: Diagnosis present

## 2022-11-10 DIAGNOSIS — Z6837 Body mass index (BMI) 37.0-37.9, adult: Secondary | ICD-10-CM | POA: Diagnosis not present

## 2022-11-10 DIAGNOSIS — F1910 Other psychoactive substance abuse, uncomplicated: Secondary | ICD-10-CM | POA: Diagnosis present

## 2022-11-10 DIAGNOSIS — T40604A Poisoning by unspecified narcotics, undetermined, initial encounter: Secondary | ICD-10-CM | POA: Diagnosis not present

## 2022-11-10 DIAGNOSIS — D573 Sickle-cell trait: Secondary | ICD-10-CM | POA: Diagnosis present

## 2022-11-10 DIAGNOSIS — F17210 Nicotine dependence, cigarettes, uncomplicated: Secondary | ICD-10-CM | POA: Diagnosis present

## 2022-11-10 DIAGNOSIS — Z833 Family history of diabetes mellitus: Secondary | ICD-10-CM | POA: Diagnosis not present

## 2022-11-10 DIAGNOSIS — F1010 Alcohol abuse, uncomplicated: Secondary | ICD-10-CM | POA: Diagnosis present

## 2022-11-10 DIAGNOSIS — Z832 Family history of diseases of the blood and blood-forming organs and certain disorders involving the immune mechanism: Secondary | ICD-10-CM | POA: Diagnosis not present

## 2022-11-10 LAB — URINALYSIS, ROUTINE W REFLEX MICROSCOPIC
Bilirubin Urine: NEGATIVE
Glucose, UA: NEGATIVE mg/dL
Hgb urine dipstick: NEGATIVE
Ketones, ur: NEGATIVE mg/dL
Leukocytes,Ua: NEGATIVE
Nitrite: NEGATIVE
Protein, ur: NEGATIVE mg/dL
Specific Gravity, Urine: 1.017 (ref 1.005–1.030)
pH: 6 (ref 5.0–8.0)

## 2022-11-10 LAB — BASIC METABOLIC PANEL
Anion gap: 11 (ref 5–15)
BUN: 13 mg/dL (ref 6–20)
CO2: 25 mmol/L (ref 22–32)
Calcium: 8.2 mg/dL — ABNORMAL LOW (ref 8.9–10.3)
Chloride: 100 mmol/L (ref 98–111)
Creatinine, Ser: 0.89 mg/dL (ref 0.44–1.00)
GFR, Estimated: >60 mL/min (ref >60)
Glucose, Bld: 107 mg/dL — ABNORMAL HIGH (ref 70–99)
Potassium: 3.5 mmol/L (ref 3.5–5.1)
Sodium: 136 mmol/L (ref 135–145)

## 2022-11-10 LAB — HIV ANTIBODY (ROUTINE TESTING W REFLEX): HIV Screen 4th Generation wRfx: NONREACTIVE

## 2022-11-10 LAB — MAGNESIUM: Magnesium: 1.9 mg/dL (ref 1.7–2.4)

## 2022-11-10 LAB — MRSA NEXT GEN BY PCR, NASAL: MRSA by PCR Next Gen: NOT DETECTED

## 2022-11-10 MED ORDER — ESCITALOPRAM OXALATE 10 MG PO TABS
10.0000 mg | ORAL_TABLET | Freq: Every day | ORAL | Status: DC
  Administered 2022-11-10 – 2022-11-11 (×2): 10 mg via ORAL
  Filled 2022-11-10 (×2): qty 1

## 2022-11-10 MED ORDER — TRAZODONE HCL 50 MG PO TABS
50.0000 mg | ORAL_TABLET | Freq: Every evening | ORAL | Status: DC | PRN
  Filled 2022-11-10: qty 1

## 2022-11-10 MED ORDER — CHOLECALCIFEROL 10 MCG (400 UNIT) PO TABS
400.0000 [IU] | ORAL_TABLET | Freq: Every day | ORAL | Status: DC
  Administered 2022-11-10 – 2022-11-11 (×2): 400 [IU] via ORAL
  Filled 2022-11-10 (×3): qty 1

## 2022-11-10 MED ORDER — ORAL CARE MOUTH RINSE: 15.0000 mL | OROMUCOSAL | Status: DC | PRN

## 2022-11-10 MED ORDER — HYDROXYZINE HCL 25 MG PO TABS
25.0000 mg | ORAL_TABLET | Freq: Three times a day (TID) | ORAL | Status: DC | PRN
  Administered 2022-11-11: 25 mg via ORAL
  Filled 2022-11-10: qty 1

## 2022-11-10 MED ORDER — POTASSIUM CHLORIDE CRYS ER 20 MEQ PO TBCR
20.0000 meq | EXTENDED_RELEASE_TABLET | Freq: Once | ORAL | Status: AC
  Administered 2022-11-10: 20 meq via ORAL
  Filled 2022-11-10: qty 1
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
## 2022-11-10 NOTE — Progress Notes (Signed)
Pharmacy Antibiotic Note  Kelly Hensley is a 27 y.o. female admitted on 11/09/2022 found unresponsive after taking a friend's pain meds..  Pharmacy has been consulted to dose unasyn for asp pna.  Plan: Unasyn 2g IV q6h No dose adjustments anticipated.  Pharmacy will sign off and monitor peripherally via electronic surveillance software for any changes in renal function or micro data.   Height: '5\' 8"'$  (172.7 cm) Weight: 104 kg (229 lb 4.5 oz) IBW/kg (Calculated) : 63.9  Temp (24hrs), Avg:98.3 F (36.8 C), Min:98 F (36.7 C), Max:98.9 F (37.2 C)  Recent Labs  Lab 11/09/22 1932 11/09/22 2036 11/10/22 0313  WBC 9.5  --   --   CREATININE  --  1.06* 0.89    Estimated Creatinine Clearance: 121.9 mL/min (by C-G formula based on SCr of 0.89 mg/dL).    No Known Allergies  Antimicrobials this admission: 2/22 Unasyn >>  Dose adjustments this admission: none  Microbiology results: 2/22 MRSA PCR: neg  Thank you for allowing pharmacy to be a part of this patient's care.  Peggyann Juba, PharmD, BCPS Pharmacy: [phone] 11/10/2022 8:49 AM
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
## 2022-11-10 NOTE — Progress Notes (Signed)
PROGRESS NOTE    Kelly Hensley  [id number] DOB: 05-13-1996 DOA: 11/09/2022 PCP: Pcp, No   Brief Narrative: HPI per [name] is a 27 y.o. female with medical history significant of depression, anxiety, polysubstance abuse (heroin, fentanyl, marijuana, tobacco), morbid obesity, sickle cell trait presented to the ED for opiate overdose.  She was admitted to behavioral health back in October 2023 due to worsening depressive symptoms and suicidal ideations with plan to overdose on fentanyl.  Today patient was found unresponsive after taking a friend's Percocet 10 mg tablets.  Her oxygen saturation was 30% on EMS arrival and she was given 1 mg intranasal Narcan with some partial awakening.  Placed on nonrebreather.  In the ED, patient remained drowsy despite Narcan 0.5 mg x 3 and was subsequently placed on Narcan drip.  Blood pressure soft and improved after 500 cc IV fluids.  Afebrile.  Labs showing no leukocytosis, potassium 3.3, glucose 101, creatinine 1.0, calcium 7.8, albumin 3.1, normal LFTs, UDS pending, salicylate level [id number], blood ethanol level <10, acetaminophen level <10, COVID/influenza/RSV PCR pending.  Chest x-ray showing hazy perihilar opacities concerning for developing infiltrate/aspiration.  Mild central pulmonary edema not excluded.  Started on Unasyn.  TRH called to admit.   Patient resting comfortably.  States she is here because she stopped breathing.  States she took 2 tablets of either oxycodone or Percocet 10 mg which she received from someone else.  She denies any other drug use.  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  She denies suicidal ideation.  However, soon after started crying and told me she feels very depressed since after her kids father passed away back in 04-18-23.  She is also having difficulty with housing and taking care of her kids.  She is currently not taking any of her home psych  meds.  She denies history of prior suicide attempts.  No other complaints.    Assessment & Plan:   Principal Problem:   Opiate overdose ([place]) Active Problems:   MDD (major depressive disorder)   Suicide attempt ([place])   Aspiration pneumonia ([place])   Acute hypoxemic respiratory failure (HCC)   Hypokalemia  #1 OD opiates -unclear intentional or not..remains on narcan drip.  She is awake and alert answering questions appropriately She reports several stress in her life.  She has 2 little children cared for by her mother and trying to get into nursing school facing homelessness and father of her children has passed away more than 6 months ago. Will taper the drip down as tolerated  Uds still pending Alcohol level less than 10 Acetaminophen level less than 10 Salicylates less than 7 Psych evaluation pending.  #2 aspiration pneumonia her oxygen with saturation was 30% at home with EMS.  Chest x-ray shows developing infiltrates.  Titrate O2 to keep sats above 92%.  Continue Unasyn for now. COVID RSV and influenza negative  #3 hypokalemia resolved  #4 polysubstance abuse/tobacco abuse/alcohol abuse-will consult TOC for referrals Urine drug screen is pending  Estimated body mass index is 34.86 kg/m as calculated from the following:   Height as of this encounter: '5\' 8"'$  (1.727 m).   Weight as of this encounter: 104 kg.  DVT prophylaxis: scd Code Status: full Family Communication:called  mother no response  Disposition Plan:  Status is: IP The patient will require care spanning > 2 midnights and should be moved  to inpatient because: SUICIDE RISK   Consultants: PSYCH  Procedures: NONE Antimicrobials: UNASYN Subjective:  Resting in bed  Objective: Vitals:   11/10/22 0900 11/10/22 0912 11/10/22 0915 11/10/22 0930  BP: 125/62     Pulse: 96 94 95 (!) 101  Resp: '14 18 16 17  '$ Temp:      TempSrc:      SpO2: 98% 98% 98% 97%  Weight:      Height:        Intake/Output Summary  (Last 24 hours) at 11/10/2022 0956 Last data filed at 11/10/2022 0500 Gross per 24 hour  Intake 861.93 ml  Output 550 ml  Net 311.93 ml   Filed Weights   11/10/22 0000  Weight: 104 kg    Examination:  General exam: Appears  in nad  Respiratory system: rhonchi to auscultation. Respiratory effort normal. Cardiovascular system: S1 & S2 heard, RRR. No JVD, murmurs, rubs, gallops or clicks. No pedal edema. Gastrointestinal system: Abdomen is nondistended, soft and nontender. No organomegaly or masses felt. Normal bowel sounds heard. Central nervous system: Alert and oriented. No focal neurological deficits. Extremities: Symmetric 5 x 5 power. Skin: No rashes, lesions or ulcers Psychiatry: poor judgement     Data Reviewed: I have personally reviewed following labs and imaging studies  CBC: Recent Labs  Lab 11/09/22 1932  WBC 9.5  NEUTROABS 5.7  HGB 12.9  HCT 38.3  MCV 75.4*  PLT [id number]   Basic Metabolic Panel: Recent Labs  Lab 11/09/22 2036 11/10/22 0313  NA 138 136  K 3.3* 3.5  CL 106 100  CO2 24 25  GLUCOSE 101* 107*  BUN 16 13  CREATININE 1.06* 0.89  CALCIUM 7.8* 8.2*  MG  --  1.9   GFR: Estimated Creatinine Clearance: 121.9 mL/min (by C-G formula based on SCr of 0.89 mg/dL). Liver Function Tests: Recent Labs  Lab 11/09/22 2036  AST 15  ALT 16  ALKPHOS 38  BILITOT 0.4  PROT 5.8*  ALBUMIN 3.1*   No results for input(s): "LIPASE", "AMYLASE" in the last 168 hours. No results for input(s): "AMMONIA" in the last 168 hours. Coagulation Profile: No results for input(s): "INR", "PROTIME" in the last 168 hours. Cardiac Enzymes: No results for input(s): "CKTOTAL", "CKMB", "CKMBINDEX", "TROPONINI" in the last 168 hours. BNP (last 3 results) No results for input(s): "PROBNP" in the last 8760 hours. HbA1C: No results for input(s): "HGBA1C" in the last 72 hours. CBG: Recent Labs  Lab 11/09/22 1928  GLUCAP 168*   Lipid Profile: No results for input(s):  "CHOL", "HDL", "LDLCALC", "TRIG", "CHOLHDL", "LDLDIRECT" in the last 72 hours. Thyroid Function Tests: No results for input(s): "TSH", "T4TOTAL", "FREET4", "T3FREE", "THYROIDAB" in the last 72 hours. Anemia Panel: No results for input(s): "VITAMINB12", "FOLATE", "FERRITIN", "TIBC", "IRON", "RETICCTPCT" in the last 72 hours. Sepsis Labs: No results for input(s): "PROCALCITON", "LATICACIDVEN" in the last 168 hours.  Recent Results (from the past 240 hour(s))  Resp panel by RT-PCR (RSV, Flu A&B, Covid) Anterior Nasal Swab     Status: None   Collection Time: 11/09/22  8:50 PM   Specimen: Anterior Nasal Swab  Result Value Ref Range Status   SARS Coronavirus 2 by RT PCR NEGATIVE NEGATIVE Final    Comment: (NOTE) SARS-CoV-2 target nucleic acids are NOT DETECTED.  The SARS-CoV-2 RNA is generally detectable in upper respiratory specimens during the acute phase of infection. The lowest concentration of SARS-CoV-2 viral copies this assay can detect is 138 copies/mL. A negative result does not  preclude SARS-Cov-2 infection and should not be used as the sole basis for treatment or other patient management decisions. A negative result may occur with  improper specimen collection/handling, submission of specimen other than nasopharyngeal swab, presence of viral mutation(s) within the areas targeted by this assay, and inadequate number of viral copies(<138 copies/mL). A negative result must be combined with clinical observations, patient history, and epidemiological information. The expected result is Negative.  Fact Sheet for Patients:  EntrepreneurPulse.com.au  Fact Sheet for Healthcare Providers:  IncredibleEmployment.be  This test is no t yet approved or cleared by the [place] FDA and  has been authorized for detection and/or diagnosis of SARS-CoV-2 by FDA under an Emergency Use Authorization (EUA). This EUA will remain  in effect (meaning this test  can be used) for the duration of the COVID-19 declaration under Section 564(b)(1) of the Act, 21 U.S.C.section 360bbb-3(b)(1), unless the authorization is terminated  or revoked sooner.       Influenza A by PCR NEGATIVE NEGATIVE Final   Influenza B by PCR NEGATIVE NEGATIVE Final    Comment: (NOTE) The Xpert Xpress SARS-CoV-2/FLU/RSV plus assay is intended as an aid in the diagnosis of influenza from Nasopharyngeal swab specimens and should not be used as a sole basis for treatment. Nasal washings and aspirates are unacceptable for Xpert Xpress SARS-CoV-2/FLU/RSV testing.  Fact Sheet for Patients: EntrepreneurPulse.com.au  Fact Sheet for Healthcare Providers: IncredibleEmployment.be  This test is not yet approved or cleared by the [place] FDA and has been authorized for detection and/or diagnosis of SARS-CoV-2 by FDA under an Emergency Use Authorization (EUA). This EUA will remain in effect (meaning this test can be used) for the duration of the COVID-19 declaration under Section 564(b)(1) of the Act, 21 U.S.C. section 360bbb-3(b)(1), unless the authorization is terminated or revoked.     Resp Syncytial Virus by PCR NEGATIVE NEGATIVE Final    Comment: (NOTE) Fact Sheet for Patients: EntrepreneurPulse.com.au  Fact Sheet for Healthcare Providers: IncredibleEmployment.be  This test is not yet approved or cleared by the [place] FDA and has been authorized for detection and/or diagnosis of SARS-CoV-2 by FDA under an Emergency Use Authorization (EUA). This EUA will remain in effect (meaning this test can be used) for the duration of the COVID-19 declaration under Section 564(b)(1) of the Act, 21 U.S.C. section 360bbb-3(b)(1), unless the authorization is terminated or revoked.  Performed at [facility], [place] [address] [place], [place] [address]   MRSA Next Gen by PCR, Nasal      Status: None   Collection Time: 11/10/22 12:58 AM   Specimen: Nasal Mucosa; Nasal Swab  Result Value Ref Range Status   MRSA by PCR Next Gen NOT DETECTED NOT DETECTED Final    Comment: (NOTE) The GeneXpert MRSA Assay (FDA approved for NASAL specimens only), is one component of a comprehensive MRSA colonization surveillance program. It is not intended to diagnose MRSA infection nor to guide or monitor treatment for MRSA infections. Test performance is not FDA approved in patients less than 54 years old. Performed at [facility], [place] [address], [place], [place] [address]          Radiology Studies: DG Chest Portable 1 View  Result Date: 11/09/2022 CLINICAL DATA:  Overdose.  Hypoxia. EXAM: PORTABLE CHEST 1 VIEW COMPARISON:  None Available. FINDINGS: Hazy opacity in the perihilar regions bilaterally. The heart, hila, and mediastinum are normal. No pneumothorax. No nodules or masses. No focal infiltrates. IMPRESSION: Hazy perihilar opacities  could represent developing infiltrate such as aspiration given history. Mild central pulmonary edema not excluded. Recommend clinical correlation. Electronically Signed   By: Dorise Bullion III M.D.   On: 11/09/2022 19:47        Scheduled Meds:  Chlorhexidine Gluconate Cloth  6 each Topical Daily   mouth rinse  15 mL Mouth Rinse 4 times per day   Continuous Infusions:  ampicillin-sulbactam (UNASYN) IV 3 g (11/10/22 0936)   naloxone HCl (NARCAN) 4 mg in dextrose 5 % 250 mL infusion 0.25 mg/hr (11/10/22 0315)     LOS: 0 days    Time spent: 82 min   Georgette Shell, MD  11/10/2022, 9:56 AM

## 2022-11-10 NOTE — Evaluation (Signed)
SLP Cancellation Note  Patient Details Name: Kelly Hensley MRN: [id number] DOB: 1996-09-18   Cancelled treatment:       Reason Eval/Treat Not Completed: Other (comment) (MD desired order cancelled due to pt not having dypshagia, please reorder if desire)  Kathleen Lime, MS [place] [facility] [phone]  Macario Golds 11/10/2022, 1:00 PM

## 2022-11-10 NOTE — Discharge Instructions (Signed)
[address] [place], [place] [address] [phone]  [facility] [address] [place], [place] [address] [phone]

## 2022-11-10 NOTE — Progress Notes (Signed)
Chaplain visited with pt while rounding in ICU, pt was facing some uncertainty, she has experienced the loss of a loved one recentlyand appreciated the opportunity to talk openly about her experiences. She has spiritual beliefs that help her navigate setbacks and has goals to become a nurse. I provided reflective listening, a nonjudgmental, calm presence and spoke a blessing over her at her request.    11/10/22 1600  Spiritual Encounters  Type of Visit Initial  Care provided to: Patient  Referral source Chaplain assessment  Reason for visit Routine spiritual support  OnCall Visit No  Spiritual Framework  Presenting Themes Impactful experiences and emotions  Community/Connection Family;Friend(s)  Patient Stress Factors Financial concerns;Major life changes;Loss  Interventions  Spiritual Care Interventions Made Established relationship of care and support;Compassionate presence;Reflective listening;Explored values/beliefs/practices/strengths;Prayer;Encouragement  Intervention Outcomes  Outcomes Connection to spiritual care;Awareness of support;Reduced anxiety

## 2022-11-11 ENCOUNTER — Inpatient Hospital Stay (HOSPITAL_COMMUNITY): Payer: Medicaid Other

## 2022-11-11 DIAGNOSIS — T40604A Poisoning by unspecified narcotics, undetermined, initial encounter: Secondary | ICD-10-CM

## 2022-11-11 DIAGNOSIS — F330 Major depressive disorder, recurrent, mild: Secondary | ICD-10-CM

## 2022-11-11 LAB — CBC
HCT: 30 % — ABNORMAL LOW (ref 36.0–46.0)
Hemoglobin: 10.3 g/dL — ABNORMAL LOW (ref 12.0–15.0)
MCH: 25.7 pg — ABNORMAL LOW (ref 26.0–34.0)
MCHC: 34.3 g/dL (ref 30.0–36.0)
MCV: 74.8 fL — ABNORMAL LOW (ref 80.0–100.0)
Platelets: 242 10*3/uL (ref 150–400)
RBC: 4.01 MIL/uL (ref 3.87–5.11)
RDW: 13.2 % (ref 11.5–15.5)
WBC: 8.2 10*3/uL (ref 4.0–10.5)
nRBC: 0 % (ref 0.0–0.2)

## 2022-11-11 LAB — COMPREHENSIVE METABOLIC PANEL
ALT: 14 U/L (ref 0–44)
AST: 13 U/L — ABNORMAL LOW (ref 15–41)
Albumin: 2.6 g/dL — ABNORMAL LOW (ref 3.5–5.0)
Alkaline Phosphatase: 32 U/L — ABNORMAL LOW (ref 38–126)
Anion gap: 12 (ref 5–15)
BUN: 9 mg/dL (ref 6–20)
CO2: 27 mmol/L (ref 22–32)
Calcium: 8.5 mg/dL — ABNORMAL LOW (ref 8.9–10.3)
Chloride: 100 mmol/L (ref 98–111)
Creatinine, Ser: 0.87 mg/dL (ref 0.44–1.00)
GFR, Estimated: >60 mL/min (ref >60)
Glucose, Bld: 91 mg/dL (ref 70–99)
Potassium: 3.7 mmol/L (ref 3.5–5.1)
Sodium: 139 mmol/L (ref 135–145)
Total Bilirubin: 0.4 mg/dL (ref 0.3–1.2)
Total Protein: 5.3 g/dL — ABNORMAL LOW (ref 6.5–8.1)

## 2022-11-11 MED ORDER — METRONIDAZOLE 500 MG PO TABS
500.0000 mg | ORAL_TABLET | Freq: Once | ORAL | Status: AC
  Administered 2022-11-11: 500 mg via ORAL
  Filled 2022-11-11: qty 1

## 2022-11-11 MED ORDER — TRAZODONE HCL 50 MG PO TABS: 50.0000 mg | ORAL_TABLET | Freq: Every evening | ORAL | 4 refills | Status: DC | PRN

## 2022-11-11 MED ORDER — METRONIDAZOLE 500 MG PO TABS: 500.0000 mg | ORAL_TABLET | Freq: Two times a day (BID) | ORAL | 0 refills | Status: AC

## 2022-11-11 MED ORDER — CHOLECALCIFEROL 10 MCG (400 UNIT) PO TABS: 400.0000 [IU] | ORAL_TABLET | Freq: Every day | ORAL | 0 refills | Status: DC

## 2022-11-11 MED ORDER — NALOXONE HCL 4 MG/0.1ML NA LIQD: NASAL | 4 refills | Status: DC

## 2022-11-11 MED ORDER — AMOXICILLIN-POT CLAVULANATE 500-125 MG PO TABS: 1.0000 | ORAL_TABLET | Freq: Three times a day (TID) | ORAL | 0 refills | Status: DC

## 2022-11-11 MED ORDER — ESCITALOPRAM OXALATE 10 MG PO TABS: 10.0000 mg | ORAL_TABLET | Freq: Every day | ORAL | 4 refills | Status: DC

## 2022-11-11 MED ORDER — HYDROXYZINE HCL 25 MG PO TABS: 25.0000 mg | ORAL_TABLET | Freq: Three times a day (TID) | ORAL | 4 refills | Status: AC | PRN

## 2022-11-11 NOTE — TOC Transition Note (Signed)
Transition of Care [employer]) - CM/SW Discharge Note  Patient Details  Name: Kelly Hensley MRN: [id number] Date of Birth: [birth year]  Transition of Care [facility]) CM/SW Contact:  Sherie Don, LCSW Phone Number: 11/11/2022, 4:14 PM  Clinical Narrative: Bus pass provided to RN; patient will discharge back to the [facility]. Food pantry information added to AVS. TOC signing off.  Final next level of care: Other (comment) [facility]) Barriers to Discharge: Barriers Resolved  Patient Goals and CMS Choice CMS Medicare.gov Compare Post Acute Care list provided to:: Patient Choice offered to / list presented to : Patient  Discharge Plan and Services Additional resources added to the After Visit Summary for   In-house Referral: NA Discharge Planning Services: CM Consult Post Acute Care Choice: NA          DME Arranged: N/A DME Agency: NA HH Arranged: NA HH Agency: NA  Social Determinants of Health (SDOH) Interventions SDOH Screenings   Food Insecurity: Food Insecurity Present (11/10/2022)  Housing: High Risk (11/10/2022)  Transportation Needs: Unmet Transportation Needs (11/10/2022)  Utilities: Not At Risk (06/25/2022)  Alcohol Screen: Low Risk  (06/25/2022)  Depression (PHQ2-9): Medium Risk (09/27/2018)  Tobacco Use: High Risk (11/09/2022)   Readmission Risk Interventions     No data to display
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
## 2022-11-11 NOTE — Progress Notes (Incomplete)
PROGRESS NOTE    Kelly Hensley  [id number] DOB: November 01, 1995 DOA: 11/09/2022 PCP: Pcp, No   Brief Narrative: HPI per Dr. Carleene Hensley is a 27 y.o. female with medical history significant of depression, anxiety, polysubstance abuse (heroin, fentanyl, marijuana, tobacco), morbid obesity, sickle cell trait presented to the ED for opiate overdose.  She was admitted to behavioral health back in October 2023 due to worsening depressive symptoms and suicidal ideations with plan to overdose on fentanyl.  Today patient was found unresponsive after taking a friend's Percocet 10 mg tablets.  Her oxygen saturation was 30% on EMS arrival and she was given 1 mg intranasal Narcan with some partial awakening.  Placed on nonrebreather.  In the ED, patient remained drowsy despite Narcan 0.5 mg x 3 and was subsequently placed on Narcan drip.  Blood pressure soft and improved after 500 cc IV fluids.  Afebrile.  Labs showing no leukocytosis, potassium 3.3, glucose 101, creatinine 1.0, calcium 7.8, albumin 3.1, normal LFTs, UDS pending, salicylate level [id number], blood ethanol level <10, acetaminophen level <10, COVID/influenza/RSV PCR pending.  Chest x-ray showing hazy perihilar opacities concerning for developing infiltrate/aspiration.  Mild central pulmonary edema not excluded.  Started on Unasyn.  TRH called to admit.   Patient resting comfortably.  States she is here because she stopped breathing.  States she took 2 tablets of either oxycodone or Percocet 10 mg which she received from someone else.  She denies any other drug use.  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  She denies suicidal ideation.  However, soon after started crying and told me she feels very depressed since after her kids father passed away back in 04/16/2023.  She is also having difficulty with housing and taking care of her kids.  She is currently not taking any of her home psych  meds.  She denies history of prior suicide attempts.  No other complaints.    Assessment & Plan:   Principal Problem:   Opiate overdose ([place]) Active Problems:   MDD (major depressive disorder)   Suicide attempt ([place])   Aspiration pneumonia ([place])   Acute respiratory failure ([facility])   Hypokalemia  #1 OD opiates -unclear intentional or not..remains on narcan drip.  She is awake and alert answering questions appropriately She reports several stress in her life.  She has 2 little children cared for by her mother and trying to get into nursing school facing homelessness and father of her children has passed away more than 6 months ago. Will taper the drip down as tolerated  Uds still pending Alcohol level less than 10 Acetaminophen level less than 10 Salicylates less than 7 Psych evaluation noted not suicidal   #2 aspiration pneumonia her oxygen saturation was 30% at home with EMS.  Chest x-ray shows developing infiltrates.  Titrate O2 to keep sats above 92%.  Continue Unasyn for now. COVID RSV and influenza negative  #3 hypokalemia resolved  #4 polysubstance abuse/tobacco abuse/alcohol abuse-will consult TOC for referrals Urine drug screen is ordered not done  Estimated body mass index is 34.86 kg/m as calculated from the following:   Height as of this encounter: '5\' 8"'$  (1.727 m).   Weight as of this encounter: 104 kg.  DVT prophylaxis: scd Code Status: full Family Communication:called  mother no response  Disposition Plan:  Status is: IP The patient will require care spanning > 2 midnights and  should be moved to inpatient because: SUICIDE RISK   Consultants: [facility]  Procedures: NONE Antimicrobials: UNASYN Subjective:  Resting in bed  Objective: Vitals:   11/11/22 0148 11/11/22 0555 11/11/22 0949 11/11/22 1341  BP: 112/70 (!) 98/50 (!) 112/54 126/67  Pulse: 71 76 96 85  Resp: '18 19 18 18  '$ Temp: 98.4 F (36.9 C) 98.4 F (36.9 C) 98.6 F (37 C) 98.8 F (37.1 C)   TempSrc: Oral Oral Oral Oral  SpO2: 99% 94% 96% 99%  Weight:      Height:        Intake/Output Summary (Last 24 hours) at 11/11/2022 1403 Last data filed at 11/11/2022 0314 Gross per 24 hour  Intake 187.82 ml  Output 1650 ml  Net -1462.18 ml    Filed Weights   11/10/22 0000  Weight: 104 kg    Examination:  General exam: Appears  in nad  Respiratory system: rhonchi to auscultation. Respiratory effort normal. Cardiovascular system: S1 & S2 heard, RRR. No JVD, murmurs, rubs, gallops or clicks. No pedal edema. Gastrointestinal system: Abdomen is nondistended, soft and nontender. No organomegaly or masses felt. Normal bowel sounds heard. Central nervous system: Alert and oriented. No focal neurological deficits. Extremities: Symmetric 5 x 5 power. Skin: No rashes, lesions or ulcers Psychiatry: poor judgement     Data Reviewed: I have personally reviewed following labs and imaging studies  CBC: Recent Labs  Lab 11/09/22 1932 11/11/22 0724  WBC 9.5 8.2  NEUTROABS 5.7  --   HGB 12.9 10.3*  HCT 38.3 30.0*  MCV 75.4* 74.8*  PLT 361 [id number]    Basic Metabolic Panel: Recent Labs  Lab 11/09/22 2036 11/10/22 0313 11/11/22 0724  NA 138 136 139  K 3.3* 3.5 3.7  CL 106 100 100  CO2 '24 25 27  '$ GLUCOSE 101* 107* 91  BUN '16 13 9  '$ CREATININE 1.06* 0.89 0.87  CALCIUM 7.8* 8.2* 8.5*  MG  --  1.9  --     GFR: Estimated Creatinine Clearance: 124.7 mL/min (by C-G formula based on SCr of 0.87 mg/dL). Liver Function Tests: Recent Labs  Lab 11/09/22 2036 11/11/22 0724  AST 15 13*  ALT 16 14  ALKPHOS 38 32*  BILITOT 0.4 0.4  PROT 5.8* 5.3*  ALBUMIN 3.1* 2.6*    No results for input(s): "LIPASE", "AMYLASE" in the last 168 hours. No results for input(s): "AMMONIA" in the last 168 hours. Coagulation Profile: No results for input(s): "INR", "PROTIME" in the last 168 hours. Cardiac Enzymes: No results for input(s): "CKTOTAL", "CKMB", "CKMBINDEX", "TROPONINI" in the last  168 hours. BNP (last 3 results) No results for input(s): "PROBNP" in the last 8760 hours. HbA1C: No results for input(s): "HGBA1C" in the last 72 hours. CBG: Recent Labs  Lab 11/09/22 1928  GLUCAP 168*    Lipid Profile: No results for input(s): "CHOL", "HDL", "LDLCALC", "TRIG", "CHOLHDL", "LDLDIRECT" in the last 72 hours. Thyroid Function Tests: No results for input(s): "TSH", "T4TOTAL", "FREET4", "T3FREE", "THYROIDAB" in the last 72 hours. Anemia Panel: No results for input(s): "VITAMINB12", "FOLATE", "FERRITIN", "TIBC", "IRON", "RETICCTPCT" in the last 72 hours. Sepsis Labs: No results for input(s): "PROCALCITON", "LATICACIDVEN" in the last 168 hours.  Recent Results (from the past 240 hour(s))  Resp panel by RT-PCR (RSV, Flu A&B, Covid) Anterior Nasal Swab     Status: None   Collection Time: 11/09/22  8:50 PM   Specimen: Anterior Nasal Swab  Result Value Ref Range Status   SARS Coronavirus 2 by RT  PCR NEGATIVE NEGATIVE Final    Comment: (NOTE) SARS-CoV-2 target nucleic acids are NOT DETECTED.  The SARS-CoV-2 RNA is generally detectable in upper respiratory specimens during the acute phase of infection. The lowest concentration of SARS-CoV-2 viral copies this assay can detect is 138 copies/mL. A negative result does not preclude SARS-Cov-2 infection and should not be used as the sole basis for treatment or other patient management decisions. A negative result may occur with  improper specimen collection/handling, submission of specimen other than nasopharyngeal swab, presence of viral mutation(s) within the areas targeted by this assay, and inadequate number of viral copies(<138 copies/mL). A negative result must be combined with clinical observations, patient history, and epidemiological information. The expected result is Negative.  Fact Sheet for Patients:  EntrepreneurPulse.com.au  Fact Sheet for Healthcare Providers:   IncredibleEmployment.be  This test is no t yet approved or cleared by the [place] FDA and  has been authorized for detection and/or diagnosis of SARS-CoV-2 by FDA under an Emergency Use Authorization (EUA). This EUA will remain  in effect (meaning this test can be used) for the duration of the COVID-19 declaration under Section 564(b)(1) of the Act, 21 U.S.C.section 360bbb-3(b)(1), unless the authorization is terminated  or revoked sooner.       Influenza A by PCR NEGATIVE NEGATIVE Final   Influenza B by PCR NEGATIVE NEGATIVE Final    Comment: (NOTE) The Xpert Xpress SARS-CoV-2/FLU/RSV plus assay is intended as an aid in the diagnosis of influenza from Nasopharyngeal swab specimens and should not be used as a sole basis for treatment. Nasal washings and aspirates are unacceptable for Xpert Xpress SARS-CoV-2/FLU/RSV testing.  Fact Sheet for Patients: EntrepreneurPulse.com.au  Fact Sheet for Healthcare Providers: IncredibleEmployment.be  This test is not yet approved or cleared by the [place] FDA and has been authorized for detection and/or diagnosis of SARS-CoV-2 by FDA under an Emergency Use Authorization (EUA). This EUA will remain in effect (meaning this test can be used) for the duration of the COVID-19 declaration under Section 564(b)(1) of the Act, 21 U.S.C. section 360bbb-3(b)(1), unless the authorization is terminated or revoked.     Resp Syncytial Virus by PCR NEGATIVE NEGATIVE Final    Comment: (NOTE) Fact Sheet for Patients: EntrepreneurPulse.com.au  Fact Sheet for Healthcare Providers: IncredibleEmployment.be  This test is not yet approved or cleared by the [place] FDA and has been authorized for detection and/or diagnosis of SARS-CoV-2 by FDA under an Emergency Use Authorization (EUA). This EUA will remain in effect (meaning this test can be used) for  the duration of the COVID-19 declaration under Section 564(b)(1) of the Act, 21 U.S.C. section 360bbb-3(b)(1), unless the authorization is terminated or revoked.  Performed at [facility], [place] [address], [place], [place] [address]   MRSA Next Gen by PCR, Nasal     Status: None   Collection Time: 11/10/22 12:58 AM   Specimen: Nasal Mucosa; Nasal Swab  Result Value Ref Range Status   MRSA by PCR Next Gen NOT DETECTED NOT DETECTED Final    Comment: (NOTE) The GeneXpert MRSA Assay (FDA approved for NASAL specimens only), is one component of a comprehensive MRSA colonization surveillance program. It is not intended to diagnose MRSA infection nor to guide or monitor treatment for MRSA infections. Test performance is not FDA approved in patients less than 38 years old. Performed at [facility], [place] [address], [place], [place] [address]          Radiology Studies:  DG Chest 1 View  Result Date: 11/11/2022 CLINICAL DATA:  Shortness of breath. EXAM: CHEST  1 VIEW COMPARISON:  Radiograph 11/09/2022 FINDINGS: Improving bilateral suprahilar opacities, greatest residual on the right. No new or progressive airspace disease. Stable heart size and mediastinal contours. No pleural effusion or pneumothorax. No acute osseous findings IMPRESSION: Improving bilateral suprahilar opacities, greatest residual on the right. Findings may represent improving pneumonia/aspiration or less likely pulmonary edema. Electronically Signed   By: Keith Rake M.D.   On: 11/11/2022 12:40   DG Chest Portable 1 View  Result Date: 11/09/2022 CLINICAL DATA:  Overdose.  Hypoxia. EXAM: PORTABLE CHEST 1 VIEW COMPARISON:  None Available. FINDINGS: Hazy opacity in the perihilar regions bilaterally. The heart, hila, and mediastinum are normal. No pneumothorax. No nodules or masses. No focal infiltrates. IMPRESSION: Hazy perihilar opacities could represent developing infiltrate such as  aspiration given history. Mild central pulmonary edema not excluded. Recommend clinical correlation. Electronically Signed   By: Dorise Bullion III M.D.   On: 11/09/2022 19:47        Scheduled Meds:  cholecalciferol  400 Units Oral Daily   escitalopram  10 mg Oral Daily   mouth rinse  15 mL Mouth Rinse 4 times per day   Continuous Infusions:  ampicillin-sulbactam (UNASYN) IV 3 g (11/11/22 1015)     LOS: 1 day    Time spent: 52 min   Georgette Shell, MD  11/11/2022, 2:03 PM

## 2022-11-11 NOTE — Discharge Summary (Signed)
Physician Discharge Summary  NOON SIPP [id number] DOB: [birth year] DOA: 11/09/2022  PCP: Merryl Hacker, No  Admit date: 11/09/2022 Discharge date: 11/11/2022  Admitted From: Shelter Disposition: Shelter Recommendations for Outpatient Follow-up:  Follow up with PCP in 1-2 weeks Please obtain BMP/CBC in one week Please follow up with GYN as needed  Home Health: None  Equipment/Devices: None Discharge Condition stable CODE STATUS: Full Diet recommendation: Cardiac Brief/Interim Summary:  Kelly Hensley is a 27 y.o. female with medical history significant of depression, anxiety, polysubstance abuse (heroin, fentanyl, marijuana, tobacco), morbid obesity, sickle cell trait presented to the ED for opiate overdose.  She was admitted to behavioral health back in October 2023 due to worsening depressive symptoms and suicidal ideations with plan to overdose on fentanyl.  Today patient was found unresponsive after taking a friend's Percocet 10 mg tablets.  Her oxygen saturation was 30% on EMS arrival and she was given 1 mg intranasal Narcan with some partial awakening.  Placed on nonrebreather.  In the ED, patient remained drowsy despite Narcan 0.5 mg x 3 and was subsequently placed on Narcan drip.  Blood pressure soft and improved after 500 cc IV fluids.  Afebrile.  Labs showing no leukocytosis, potassium 3.3, glucose 101, creatinine 1.0, calcium 7.8, albumin 3.1, normal LFTs, UDS pending, salicylate level [id number], blood ethanol level <10, acetaminophen level <10, COVID/influenza/RSV PCR pending.  Chest x-ray showing hazy perihilar opacities concerning for developing infiltrate/aspiration.  Mild central pulmonary edema not excluded.  Started on Unasyn.  TRH called to admit.   Patient resting comfortably.  States she is here because she stopped breathing.  States she took 2 tablets of either oxycodone or Percocet 10 mg which she received from someone else.  She denies any other drug use.  States she used to be a  "fentanyl addict" and went to rehab last year in April and has not used it since then.  She denies suicidal ideation.  However, soon after started crying and told me she feels very depressed since after her kids father passed away back in April 28, 2023.  She is also having difficulty with housing and taking care of her kids.  She is currently not taking any of her home psych meds.  She denies history of prior suicide attempts.  No other complaints.   Discharge Diagnoses:  Principal Problem:   Opiate overdose ([place]) Active Problems:   MDD (major depressive disorder)   Suicide attempt ([place])   Aspiration pneumonia ([place])   Acute respiratory failure ([facility])   Hypokalemia  #1 OD opiates -patient was treated with Narcan drip in the ICU setting.  This was tapered off.  She was seen by psych and deemed that she was not in psychosis or self harming thoughts.  Patient was cleared for discharge from psych point of view.  The day of discharge she was awake alert and answering questions appropriately.  Her alcohol level less than 10 Acetaminophen level less than 10 Salicylates less than 7   #2 aspiration pneumonia her oxygen with saturation was 30% at home with EMS.  Chest x-ray shows developing infiltrates.  Oxygen saturation was above 92% with ambulation on the day of discharge.  She was treated with Unasyn during the hospital stay and discharged on Augmentin. COVID RSV and influenza negative   #3 hypokalemia resolved potassium 3.7 on the day of discharge.   #4 polysubstance abuse/tobacco abuse/alcohol abuse patient with seen by TOC and given resources.   #5 vaginal discharge patient with complaints of vaginal discharge  foul-smelling with prior history of bacterial vaginosis she was given 7-day supply of Flagyl.    Estimated body mass index is 34.86 kg/m as calculated from the following:   Height as of this encounter: '5\' 8"'$  (1.727 m).   Weight as of this encounter: 104 kg.  Discharge Instructions  Discharge  Instructions     Diet - low sodium heart healthy   Complete by: As directed    Increase activity slowly   Complete by: As directed       Allergies as of 11/11/2022   No Known Allergies      Medication List     TAKE these medications    amoxicillin-clavulanate 500-125 MG tablet Commonly known as: Augmentin Take 1 tablet by mouth 3 (three) times daily.   cholecalciferol 10 MCG (400 UNIT) Tabs tablet Commonly known as: VITAMIN D3 Take 1 tablet (400 Units total) by mouth daily. Start taking on: November 12, 2022   escitalopram 10 MG tablet Commonly known as: LEXAPRO Take 1 tablet (10 mg total) by mouth daily. Start taking on: November 12, 2022 What changed:  medication strength how much to take   hydrOXYzine 25 MG tablet Commonly known as: ATARAX Take 1 tablet (25 mg total) by mouth 3 (three) times daily as needed for anxiety.   IRON PO Take 2 tablets by mouth daily.   metroNIDAZOLE 500 MG tablet Commonly known as: Flagyl Take 1 tablet (500 mg total) by mouth 2 (two) times daily for 14 days.   naloxone 4 MG/0.1ML Liqd nasal spray kit Commonly known as: NARCAN 1 spray to the nostril   traZODone 50 MG tablet Commonly known as: DESYREL Take 1 tablet (50 mg total) by mouth at bedtime as needed for sleep. What changed:  medication strength how much to take when to take this reasons to take this   TYLENOL PO Take 2 tablets by mouth as needed (pain).        Follow-up [place], [place], CNM Follow up.   Specialty: Obstetrics and Gynecology Why: she c/o vaginal discharge Contact information: [place] [address] [place] Follow up.   Why: [employer] can assist with food assistance. Contact information: [address] [place], [place] [address] [phone]               No Known  Allergies  Consultations: Psychiatry   Procedures/Studies: DG Chest 1 View  Result Date: 11/11/2022 CLINICAL DATA:  Shortness of breath. EXAM: CHEST  1 VIEW COMPARISON:  Radiograph 11/09/2022 FINDINGS: Improving bilateral suprahilar opacities, greatest residual on the right. No new or progressive airspace disease. Stable heart size and mediastinal contours. No pleural effusion or pneumothorax. No acute osseous findings IMPRESSION: Improving bilateral suprahilar opacities, greatest residual on the right. Findings may represent improving pneumonia/aspiration or less likely pulmonary edema. Electronically Signed   By: Keith Rake M.D.   On: 11/11/2022 12:40   DG Chest Portable 1 View  Result Date: 11/09/2022 CLINICAL DATA:  Overdose.  Hypoxia. EXAM: PORTABLE CHEST 1 VIEW COMPARISON:  None Available. FINDINGS: Hazy opacity in the perihilar regions bilaterally. The heart, hila, and mediastinum are normal. No pneumothorax. No nodules or masses. No focal infiltrates. IMPRESSION: Hazy perihilar opacities could represent developing infiltrate such as aspiration given history. Mild central pulmonary edema not excluded. Recommend clinical correlation. Electronically Signed   By: Dorise Bullion III M.D.   On:  11/09/2022 19:47   (Echo, Carotid, EGD, Colonoscopy, ERCP)    Subjective: Resting in bed in no acute distress awake alert  Discharge Exam: Vitals:   11/11/22 0949 11/11/22 1341  BP: (!) 112/54 126/67  Pulse: 96 85  Resp: 18 18  Temp: 98.6 F (37 C) 98.8 F (37.1 C)  SpO2: 96% 99%   Vitals:   11/11/22 0148 11/11/22 0555 11/11/22 0949 11/11/22 1341  BP: 112/70 (!) 98/50 (!) 112/54 126/67  Pulse: 71 76 96 85  Resp: '18 19 18 18  '$ Temp: 98.4 F (36.9 C) 98.4 F (36.9 C) 98.6 F (37 C) 98.8 F (37.1 C)  TempSrc: Oral Oral Oral Oral  SpO2: 99% 94% 96% 99%  Weight:      Height:        General: Pt is alert, awake, not in acute distress Cardiovascular: RRR, S1/S2 +, no rubs, no  gallops Respiratory: CTA bilaterally, no wheezing, no rhonchi Abdominal: Soft, NT, ND, bowel sounds + Extremities: no edema, no cyanosis    The results of significant diagnostics from this hospitalization (including imaging, microbiology, ancillary and laboratory) are listed below for reference.     Microbiology: Recent Results (from the past 240 hour(s))  Resp panel by RT-PCR (RSV, Flu A&B, Covid) Anterior Nasal Swab     Status: None   Collection Time: 11/09/22  8:50 PM   Specimen: Anterior Nasal Swab  Result Value Ref Range Status   SARS Coronavirus 2 by RT PCR NEGATIVE NEGATIVE Final    Comment: (NOTE) SARS-CoV-2 target nucleic acids are NOT DETECTED.  The SARS-CoV-2 RNA is generally detectable in upper respiratory specimens during the acute phase of infection. The lowest concentration of SARS-CoV-2 viral copies this assay can detect is 138 copies/mL. A negative result does not preclude SARS-Cov-2 infection and should not be used as the sole basis for treatment or other patient management decisions. A negative result may occur with  improper specimen collection/handling, submission of specimen other than nasopharyngeal swab, presence of viral mutation(s) within the areas targeted by this assay, and inadequate number of viral copies(<138 copies/mL). A negative result must be combined with clinical observations, patient history, and epidemiological information. The expected result is Negative.  Fact Sheet for Patients:  EntrepreneurPulse.com.au  Fact Sheet for Healthcare Providers:  IncredibleEmployment.be  This test is no t yet approved or cleared by the [place] FDA and  has been authorized for detection and/or diagnosis of SARS-CoV-2 by FDA under an Emergency Use Authorization (EUA). This EUA will remain  in effect (meaning this test can be used) for the duration of the COVID-19 declaration under Section 564(b)(1) of the Act,  21 U.S.C.section 360bbb-3(b)(1), unless the authorization is terminated  or revoked sooner.       Influenza A by PCR NEGATIVE NEGATIVE Final   Influenza B by PCR NEGATIVE NEGATIVE Final    Comment: (NOTE) The Xpert Xpress SARS-CoV-2/FLU/RSV plus assay is intended as an aid in the diagnosis of influenza from Nasopharyngeal swab specimens and should not be used as a sole basis for treatment. Nasal washings and aspirates are unacceptable for Xpert Xpress SARS-CoV-2/FLU/RSV testing.  Fact Sheet for Patients: EntrepreneurPulse.com.au  Fact Sheet for Healthcare Providers: IncredibleEmployment.be  This test is not yet approved or cleared by the [place] FDA and has been authorized for detection and/or diagnosis of SARS-CoV-2 by FDA under an Emergency Use Authorization (EUA). This EUA will remain in effect (meaning this test can be used) for the duration of the COVID-19 declaration under  Section 564(b)(1) of the Act, 21 U.S.C. section 360bbb-3(b)(1), unless the authorization is terminated or revoked.     Resp Syncytial Virus by PCR NEGATIVE NEGATIVE Final    Comment: (NOTE) Fact Sheet for Patients: EntrepreneurPulse.com.au  Fact Sheet for Healthcare Providers: IncredibleEmployment.be  This test is not yet approved or cleared by the [place] FDA and has been authorized for detection and/or diagnosis of SARS-CoV-2 by FDA under an Emergency Use Authorization (EUA). This EUA will remain in effect (meaning this test can be used) for the duration of the COVID-19 declaration under Section 564(b)(1) of the Act, 21 U.S.C. section 360bbb-3(b)(1), unless the authorization is terminated or revoked.  Performed at [facility] [place] [address], [place], [place] [address]   MRSA Next Gen by PCR, Nasal     Status: None   Collection Time: 11/10/22 12:58 AM   Specimen: Nasal Mucosa; Nasal Swab   Result Value Ref Range Status   MRSA by PCR Next Gen NOT DETECTED NOT DETECTED Final    Comment: (NOTE) The GeneXpert MRSA Assay (FDA approved for NASAL specimens only), is one component of a comprehensive MRSA colonization surveillance program. It is not intended to diagnose MRSA infection nor to guide or monitor treatment for MRSA infections. Test performance is not FDA approved in patients less than 66 years old. Performed at [facility], [place] [address] [place], [place] [address]      Labs: BNP (last 3 results) Recent Labs    11/09/22 2310  BNP [id number]   Basic Metabolic Panel: Recent Labs  Lab 11/09/22 2036 11/10/22 0313 11/11/22 0724  NA 138 136 139  K 3.3* 3.5 3.7  CL 106 100 100  CO2 '24 25 27  '$ GLUCOSE 101* 107* 91  BUN '16 13 9  '$ CREATININE 1.06* 0.89 0.87  CALCIUM 7.8* 8.2* 8.5*  MG  --  1.9  --    Liver Function Tests: Recent Labs  Lab 11/09/22 2036 11/11/22 0724  AST 15 13*  ALT 16 14  ALKPHOS 38 32*  BILITOT 0.4 0.4  PROT 5.8* 5.3*  ALBUMIN 3.1* 2.6*   No results for input(s): "LIPASE", "AMYLASE" in the last 168 hours. No results for input(s): "AMMONIA" in the last 168 hours. CBC: Recent Labs  Lab 11/09/22 1932 11/11/22 0724  WBC 9.5 8.2  NEUTROABS 5.7  --   HGB 12.9 10.3*  HCT 38.3 30.0*  MCV 75.4* 74.8*  PLT 361 242   Cardiac Enzymes: No results for input(s): "CKTOTAL", "CKMB", "CKMBINDEX", "TROPONINI" in the last 168 hours. BNP: Invalid input(s): "POCBNP" CBG: Recent Labs  Lab 11/09/22 1928  GLUCAP 168*   D-Dimer No results for input(s): "DDIMER" in the last 72 hours. Hgb A1c No results for input(s): "HGBA1C" in the last 72 hours. Lipid Profile No results for input(s): "CHOL", "HDL", "LDLCALC", "TRIG", "CHOLHDL", "LDLDIRECT" in the last 72 hours. Thyroid function studies No results for input(s): "TSH", "T4TOTAL", "T3FREE", "THYROIDAB" in the last 72 hours.  Invalid input(s): "FREET3" Anemia work up No  results for input(s): "VITAMINB12", "FOLATE", "FERRITIN", "TIBC", "IRON", "RETICCTPCT" in the last 72 hours. Urinalysis    Component Value Date/Time   COLORURINE YELLOW 11/10/2022 0112   APPEARANCEUR CLEAR 11/10/2022 0112   LABSPEC 1.017 11/10/2022 0112   PHURINE 6.0 11/10/2022 0112   GLUCOSEU NEGATIVE 11/10/2022 0112   HGBUR NEGATIVE 11/10/2022 0112   BILIRUBINUR NEGATIVE 11/10/2022 0112   KETONESUR NEGATIVE 11/10/2022 0112   PROTEINUR NEGATIVE 11/10/2022 0112   UROBILINOGEN 1.0 08/01/2018 1534  NITRITE NEGATIVE 11/10/2022 0112   LEUKOCYTESUR NEGATIVE 11/10/2022 0112   Sepsis Labs Recent Labs  Lab 11/09/22 1932 11/11/22 0724  WBC 9.5 8.2   Microbiology Recent Results (from the past 240 hour(s))  Resp panel by RT-PCR (RSV, Flu A&B, Covid) Anterior Nasal Swab     Status: None   Collection Time: 11/09/22  8:50 PM   Specimen: Anterior Nasal Swab  Result Value Ref Range Status   SARS Coronavirus 2 by RT PCR NEGATIVE NEGATIVE Final    Comment: (NOTE) SARS-CoV-2 target nucleic acids are NOT DETECTED.  The SARS-CoV-2 RNA is generally detectable in upper respiratory specimens during the acute phase of infection. The lowest concentration of SARS-CoV-2 viral copies this assay can detect is 138 copies/mL. A negative result does not preclude SARS-Cov-2 infection and should not be used as the sole basis for treatment or other patient management decisions. A negative result may occur with  improper specimen collection/handling, submission of specimen other than nasopharyngeal swab, presence of viral mutation(s) within the areas targeted by this assay, and inadequate number of viral copies(<138 copies/mL). A negative result must be combined with clinical observations, patient history, and epidemiological information. The expected result is Negative.  Fact Sheet for Patients:  EntrepreneurPulse.com.au  Fact Sheet for Healthcare Providers:   IncredibleEmployment.be  This test is no t yet approved or cleared by the [place] FDA and  has been authorized for detection and/or diagnosis of SARS-CoV-2 by FDA under an Emergency Use Authorization (EUA). This EUA will remain  in effect (meaning this test can be used) for the duration of the COVID-19 declaration under Section 564(b)(1) of the Act, 21 U.S.C.section 360bbb-3(b)(1), unless the authorization is terminated  or revoked sooner.       Influenza A by PCR NEGATIVE NEGATIVE Final   Influenza B by PCR NEGATIVE NEGATIVE Final    Comment: (NOTE) The Xpert Xpress SARS-CoV-2/FLU/RSV plus assay is intended as an aid in the diagnosis of influenza from Nasopharyngeal swab specimens and should not be used as a sole basis for treatment. Nasal washings and aspirates are unacceptable for Xpert Xpress SARS-CoV-2/FLU/RSV testing.  Fact Sheet for Patients: EntrepreneurPulse.com.au  Fact Sheet for Healthcare Providers: IncredibleEmployment.be  This test is not yet approved or cleared by the [place] FDA and has been authorized for detection and/or diagnosis of SARS-CoV-2 by FDA under an Emergency Use Authorization (EUA). This EUA will remain in effect (meaning this test can be used) for the duration of the COVID-19 declaration under Section 564(b)(1) of the Act, 21 U.S.C. section 360bbb-3(b)(1), unless the authorization is terminated or revoked.     Resp Syncytial Virus by PCR NEGATIVE NEGATIVE Final    Comment: (NOTE) Fact Sheet for Patients: EntrepreneurPulse.com.au  Fact Sheet for Healthcare Providers: IncredibleEmployment.be  This test is not yet approved or cleared by the [place] FDA and has been authorized for detection and/or diagnosis of SARS-CoV-2 by FDA under an Emergency Use Authorization (EUA). This EUA will remain in effect (meaning this test can be used) for  the duration of the COVID-19 declaration under Section 564(b)(1) of the Act, 21 U.S.C. section 360bbb-3(b)(1), unless the authorization is terminated or revoked.  Performed at [facility], [place] [address], [place], [place] [address]   MRSA Next Gen by PCR, Nasal     Status: None   Collection Time: 11/10/22 12:58 AM   Specimen: Nasal Mucosa; Nasal Swab  Result Value Ref Range Status   MRSA by PCR Next Gen NOT DETECTED NOT  DETECTED Final    Comment: (NOTE) The GeneXpert MRSA Assay (FDA approved for NASAL specimens only), is one component of a comprehensive MRSA colonization surveillance program. It is not intended to diagnose MRSA infection nor to guide or monitor treatment for MRSA infections. Test performance is not FDA approved in patients less than 36 years old. Performed at [facility], [place] [address], [place],  [address]      Time coordinating discharge: 39 minutes  SIGNED:   Georgette Shell, MD  [facility] 11/11/2022, 5:37 PM

## 2022-11-23 ENCOUNTER — Ambulatory Visit (HOSPITAL_COMMUNITY): Payer: Medicaid Other | Admitting: Psychiatry

## 2022-11-23 ENCOUNTER — Other Ambulatory Visit: Payer: Self-pay

## 2022-11-23 ENCOUNTER — Emergency Department (HOSPITAL_COMMUNITY)
Admission: EM | Admit: 2022-11-23 | Discharge: 2022-11-23 | Disposition: A | Discharge status: Home / Self Care | Payer: Medicaid Other | Attending: Emergency Medicine | Admitting: Emergency Medicine

## 2022-11-23 ENCOUNTER — Encounter (HOSPITAL_COMMUNITY): Payer: Self-pay | Admitting: Emergency Medicine

## 2022-11-23 ENCOUNTER — Emergency Department (HOSPITAL_COMMUNITY): Payer: Medicaid Other

## 2022-11-23 DIAGNOSIS — Y92410 Unspecified street and highway as the place of occurrence of the external cause: Secondary | ICD-10-CM | POA: Insufficient documentation

## 2022-11-23 DIAGNOSIS — M25521 Pain in right elbow: Secondary | ICD-10-CM | POA: Insufficient documentation

## 2022-11-23 DIAGNOSIS — M25561 Pain in right knee: Secondary | ICD-10-CM | POA: Diagnosis not present

## 2022-11-23 MED ORDER — ACETAMINOPHEN 325 MG PO TABS: 650.0000 mg | ORAL_TABLET | Freq: Once | ORAL | Status: DC

## 2022-11-23 NOTE — Discharge Instructions (Signed)
We performed x-rays which were negative.  We did discussed CT head and cervical spine and you deferred this at this time.  Use Motrin and Tylenol for pain and discomfort as needed.  Please return to the ED if you have worsening headache, weakness, chest pain, abdominal pain, or shortness of breath.
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
## 2022-11-23 NOTE — ED Notes (Signed)
Tech notified this RN that patient left without paper work
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
## 2023-01-10 ENCOUNTER — Ambulatory Visit: Payer: Medicaid Other | Admitting: Physician Assistant

## 2023-01-10 ENCOUNTER — Other Ambulatory Visit (HOSPITAL_COMMUNITY)
Admission: RE | Admit: 2023-01-10 | Discharge: 2023-01-10 | Disposition: A | Discharge status: Home / Self Care | Payer: Medicaid Other | Source: Ambulatory Visit | Attending: Physician Assistant | Admitting: Physician Assistant

## 2023-01-10 VITALS — BP 136/85 | HR 85 | Ht 69.0 in | Wt 239.0 lb

## 2023-01-10 DIAGNOSIS — R946 Abnormal results of thyroid function studies: Secondary | ICD-10-CM

## 2023-01-10 DIAGNOSIS — F5104 Psychophysiologic insomnia: Secondary | ICD-10-CM | POA: Diagnosis not present

## 2023-01-10 DIAGNOSIS — Z3202 Encounter for pregnancy test, result negative: Secondary | ICD-10-CM

## 2023-01-10 DIAGNOSIS — F411 Generalized anxiety disorder: Secondary | ICD-10-CM

## 2023-01-10 DIAGNOSIS — F17210 Nicotine dependence, cigarettes, uncomplicated: Secondary | ICD-10-CM

## 2023-01-10 DIAGNOSIS — Z202 Contact with and (suspected) exposure to infections with a predominantly sexual mode of transmission: Secondary | ICD-10-CM | POA: Insufficient documentation

## 2023-01-10 DIAGNOSIS — D509 Iron deficiency anemia, unspecified: Secondary | ICD-10-CM

## 2023-01-10 DIAGNOSIS — B3731 Acute candidiasis of vulva and vagina: Secondary | ICD-10-CM

## 2023-01-10 LAB — POCT URINE PREGNANCY: Preg Test, Ur: NEGATIVE

## 2023-01-10 MED ORDER — CEFTRIAXONE SODIUM 500 MG IJ SOLR
500.0000 mg | Freq: Once | INTRAMUSCULAR | Status: AC
  Administered 2023-01-10: 500 mg via INTRAMUSCULAR

## 2023-01-10 MED ORDER — ESCITALOPRAM OXALATE 10 MG PO TABS: 10.0000 mg | ORAL_TABLET | Freq: Every day | ORAL | 4 refills | Status: DC

## 2023-01-10 MED ORDER — DOXYCYCLINE HYCLATE 100 MG PO CAPS
100.0000 mg | ORAL_CAPSULE | Freq: Two times a day (BID) | ORAL | 0 refills | Status: DC
Start: 2023-01-10 — End: 2023-07-25

## 2023-01-10 MED ORDER — TRAZODONE HCL 50 MG PO TABS
50.0000 mg | ORAL_TABLET | Freq: Every evening | ORAL | 4 refills | Status: DC | PRN
Start: 2023-01-10 — End: 2023-11-16

## 2023-01-10 NOTE — Patient Instructions (Signed)
You are being given a shot of Rocephin and you will also take doxycycline twice a day for 10 days.  We will call you with today's lab results and prescribe any other treatment that might be needed.  I encourage you to avoid any type of sexual intercourse until your treatment is completed.  You are going to restart your Lexapro on a daily basis and I sent a refill for your trazodone.  I do encourage you to call and make an appointment with [facility] to establish care.  You may also return to the mobile unit as needed.  Roney Jaffe, PA-C Physician Assistant [facility] [email or web address]   Safe Sex Practicing safe sex means taking steps before and during sex to reduce your risk of: Getting an STI (sexually transmitted infection). Giving your partner an STI. Unwanted or unplanned pregnancy. How to practice safe sex Ways you can practice safe sex  Limit your sexual partners to only one partner who is having sex with only you. Avoid using alcohol and drugs before having sex. Alcohol and drugs can affect your judgment. Before having sex with a new partner: Talk to your partner about past partners, past STIs, and drug use. Get screened for STIs and discuss the results with your partner. Ask your partner to get screened too. Check your body regularly for sores, blisters, rashes, or unusual discharge. If you notice any of these problems, visit your health care provider. Avoid sexual contact if you have symptoms of an infection or you are being treated for an STI. While having sex, use a condom. Make sure to: Use a condom every time you have vaginal, oral, or anal sex. Both females and males should wear condoms during oral sex. Keep condoms in place from the beginning to the end of sexual activity. Use a latex condom, if possible. Latex condoms offer the best protection. Use only water-based lubricants with a condom. Using  petroleum-based lubricants or oils will weaken the condom and increase the chance that it will break. Ways your health care provider can help you practice safe sex  See your health care provider for regular screenings, exams, and tests for STIs. Talk with your health care provider about what kind of birth control (contraception) is best for you. Get vaccinated against hepatitis B and human papillomavirus (HPV). If you are at risk of being infected with HIV (human immunodeficiency virus), talk with your health care provider about taking a prescription medicine to prevent HIV infection. You are at risk for HIV if you: Are a man who has sex with other men. Are sexually active with more than one partner. Take drugs by injection. Have a sex partner who has HIV. Have unprotected sex. Have sex with someone who has sex with both men and women. Have had an STI. Follow these instructions at home: Take over-the-counter and prescription medicines only as told by your health care provider. Keep all follow-up visits. This is important. Where to find more information Centers for Disease Control and Prevention: FootballExhibition.com.br Planned Parenthood: www.plannedparenthood.org Office on [employer] [facility]: http://hoffman.com/ Summary Practicing safe sex means taking steps before and during sex to reduce your risk getting an STI, giving your partner an STI, and having an unwanted or unplanned pregnancy. Before having sex with a new partner, talk to your partner about past partners, past STIs, and drug use. Use a condom every time you have vaginal, oral, or anal sex. Both females and males should wear condoms during oral sex.  Check your body regularly for sores, blisters, rashes, or unusual discharge. If you notice any of these problems, visit your health care provider. See your health care provider for regular screenings, exams, and tests for STIs. This information is not intended to replace advice given to you by  your health care provider. Make sure you discuss any questions you have with your health care provider. Document Revised: 02/09/2020 Document Reviewed: 02/09/2020 Elsevier Patient Education  2023 [employer].
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
## 2023-01-11 LAB — CERVICOVAGINAL ANCILLARY ONLY
Bacterial Vaginitis (gardnerella): NEGATIVE
Candida Glabrata: NEGATIVE
Candida Vaginitis: POSITIVE — AB
Chlamydia: NEGATIVE
Comment: NEGATIVE
Comment: NEGATIVE
Comment: NEGATIVE
Comment: NEGATIVE
Comment: NEGATIVE
Comment: NORMAL
Neisseria Gonorrhea: POSITIVE — AB
Trichomonas: NEGATIVE

## 2023-01-11 MED ORDER — FLUCONAZOLE 150 MG PO TABS
150.0000 mg | ORAL_TABLET | Freq: Once | ORAL | 0 refills | Status: AC
Start: 2023-01-11 — End: 2023-01-11

## 2023-01-11 NOTE — Addendum Note (Signed)
Addended by: Roney Jaffe on: 01/11/2023 02:33 PM   Modules accepted: Orders

## 2023-01-12 LAB — CBC WITH DIFFERENTIAL/PLATELET
Basophils Absolute: 0 10*3/uL (ref 0.0–0.2)
Basos: 0 %
EOS (ABSOLUTE): 0.1 10*3/uL (ref 0.0–0.4)
Eos: 2 %
Hematocrit: 34.6 % (ref 34.0–46.6)
Hemoglobin: 11.5 g/dL (ref 11.1–15.9)
Immature Grans (Abs): 0 10*3/uL (ref 0.0–0.1)
Immature Granulocytes: 0 %
Lymphocytes Absolute: 1.5 10*3/uL (ref 0.7–3.1)
Lymphs: 31 %
MCH: 26.6 pg (ref 26.6–33.0)
MCHC: 33.2 g/dL (ref 31.5–35.7)
MCV: 80 fL (ref 79–97)
Monocytes Absolute: 0.4 10*3/uL (ref 0.1–0.9)
Monocytes: 8 %
Neutrophils Absolute: 2.8 10*3/uL (ref 1.4–7.0)
Neutrophils: 59 %
Platelets: 318 10*3/uL (ref 150–450)
RBC: 4.32 x10E6/uL (ref 3.77–5.28)
RDW: 14.5 % (ref 11.7–15.4)
WBC: 4.7 10*3/uL (ref 3.4–10.8)

## 2023-01-12 LAB — THYROID PANEL WITH TSH
Free Thyroxine Index: 1.9 (ref 1.2–4.9)
T3 Uptake Ratio: 25 % (ref 24–39)
T4, Total: 7.6 ug/dL (ref 4.5–12.0)
TSH: 0.584 u[IU]/mL (ref 0.450–4.500)

## 2023-01-12 LAB — IRON,TIBC AND FERRITIN PANEL
Ferritin: 53 ng/mL (ref 15–150)
Iron Saturation: 69 % — ABNORMAL HIGH (ref 15–55)
Iron: 230 ug/dL — ABNORMAL HIGH (ref 27–159)
Total Iron Binding Capacity: 331 ug/dL (ref 250–450)
UIBC: 101 ug/dL — ABNORMAL LOW (ref 131–425)

## 2023-01-12 LAB — HIV ANTIBODY (ROUTINE TESTING W REFLEX): HIV Screen 4th Generation wRfx: NONREACTIVE

## 2023-01-12 LAB — RPR: RPR Ser Ql: NONREACTIVE

## 2023-07-25 ENCOUNTER — Ambulatory Visit (HOSPITAL_COMMUNITY)
Admission: EM | Admit: 2023-07-25 | Discharge: 2023-07-25 | Disposition: A | Discharge status: Home / Self Care | Payer: Medicaid Other | Attending: Family Medicine | Admitting: Family Medicine

## 2023-07-25 ENCOUNTER — Encounter (HOSPITAL_COMMUNITY): Payer: Self-pay | Admitting: Emergency Medicine

## 2023-07-25 DIAGNOSIS — Z202 Contact with and (suspected) exposure to infections with a predominantly sexual mode of transmission: Secondary | ICD-10-CM | POA: Insufficient documentation

## 2023-07-25 MED ORDER — LIDOCAINE HCL (PF) 1 % IJ SOLN
INTRAMUSCULAR | Status: AC
  Filled 2023-07-25: qty 2

## 2023-07-25 MED ORDER — CEFTRIAXONE SODIUM 500 MG IJ SOLR
INTRAMUSCULAR | Status: AC
  Filled 2023-07-25: qty 500

## 2023-07-25 MED ORDER — DOXYCYCLINE HYCLATE 100 MG PO CAPS: 100.0000 mg | ORAL_CAPSULE | Freq: Two times a day (BID) | ORAL | 0 refills | Status: DC

## 2023-07-25 MED ORDER — CEFTRIAXONE SODIUM 500 MG IJ SOLR
500.0000 mg | INTRAMUSCULAR | Status: DC
  Administered 2023-07-25: 500 mg via INTRAMUSCULAR

## 2023-07-25 NOTE — Discharge Instructions (Signed)
You have been given the following today for treatment of suspected gonorrhea and/or chlamydia:  cefTRIAXone (ROCEPHIN) injection 500 mg  Please pick up your prescription for doxycycline 100 mg and begin taking twice daily for the next seven (7) days.  Even though we have treated you today, we have sent testing for sexually transmitted infections. We will notify you of any positive results once they are received. If required, we will prescribe any medications you might need.  Please refrain from all sexual activity for at least the next seven days.  

## 2023-07-25 NOTE — ED Triage Notes (Signed)
Pt presents for STD testing after boyfriend tested positive for chlamydia and gonorrhea.

## 2023-07-26 LAB — CERVICOVAGINAL ANCILLARY ONLY
Chlamydia: NEGATIVE
Comment: NEGATIVE
Comment: NEGATIVE
Comment: NORMAL
Neisseria Gonorrhea: POSITIVE — AB
Trichomonas: NEGATIVE
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
## 2023-11-15 ENCOUNTER — Emergency Department (HOSPITAL_COMMUNITY)
Admission: EM | Admit: 2023-11-15 | Discharge: 2023-11-16 | Disposition: A | Discharge status: Other Acute Inpatient Hospital | Payer: Medicaid Other | Attending: Emergency Medicine | Admitting: Emergency Medicine

## 2023-11-15 ENCOUNTER — Other Ambulatory Visit: Payer: Self-pay

## 2023-11-15 DIAGNOSIS — R45851 Suicidal ideations: Secondary | ICD-10-CM | POA: Diagnosis not present

## 2023-11-15 DIAGNOSIS — F1120 Opioid dependence, uncomplicated: Secondary | ICD-10-CM | POA: Diagnosis present

## 2023-11-15 DIAGNOSIS — F332 Major depressive disorder, recurrent severe without psychotic features: Secondary | ICD-10-CM | POA: Diagnosis present

## 2023-11-15 DIAGNOSIS — F1190 Opioid use, unspecified, uncomplicated: Secondary | ICD-10-CM | POA: Diagnosis present

## 2023-11-15 DIAGNOSIS — F1110 Opioid abuse, uncomplicated: Secondary | ICD-10-CM

## 2023-11-15 LAB — COMPREHENSIVE METABOLIC PANEL
ALT: 11 U/L (ref 0–44)
AST: 13 U/L — ABNORMAL LOW (ref 15–41)
Albumin: 3.9 g/dL (ref 3.5–5.0)
Alkaline Phosphatase: 48 U/L (ref 38–126)
Anion gap: 9 (ref 5–15)
BUN: 11 mg/dL (ref 6–20)
CO2: 25 mmol/L (ref 22–32)
Calcium: 8.6 mg/dL — ABNORMAL LOW (ref 8.9–10.3)
Chloride: 103 mmol/L (ref 98–111)
Creatinine, Ser: 0.84 mg/dL (ref 0.44–1.00)
GFR, Estimated: >60 mL/min (ref >60)
Glucose, Bld: 67 mg/dL — ABNORMAL LOW (ref 70–99)
Potassium: 3.6 mmol/L (ref 3.5–5.1)
Sodium: 137 mmol/L (ref 135–145)
Total Bilirubin: 0.2 mg/dL (ref 0.0–1.2)
Total Protein: 7.2 g/dL (ref 6.5–8.1)

## 2023-11-15 LAB — CBC
HCT: 36.2 % (ref 36.0–46.0)
Hemoglobin: 12.5 g/dL (ref 12.0–15.0)
MCH: 25.7 pg — ABNORMAL LOW (ref 26.0–34.0)
MCHC: 34.5 g/dL (ref 30.0–36.0)
MCV: 74.5 fL — ABNORMAL LOW (ref 80.0–100.0)
Platelets: 276 10*3/uL (ref 150–400)
RBC: 4.86 MIL/uL (ref 3.87–5.11)
RDW: 14 % (ref 11.5–15.5)
WBC: 4.1 10*3/uL (ref 4.0–10.5)
nRBC: 0 % (ref 0.0–0.2)

## 2023-11-15 LAB — ACETAMINOPHEN LEVEL: Acetaminophen (Tylenol), Serum: <10 ug/mL — ABNORMAL LOW (ref 10–30)

## 2023-11-15 LAB — ETHANOL: Alcohol, Ethyl (B): <10 mg/dL (ref <10)

## 2023-11-15 LAB — SALICYLATE LEVEL: Salicylate Lvl: <7 mg/dL — ABNORMAL LOW (ref 7.0–30.0)

## 2023-11-15 LAB — HCG, SERUM, QUALITATIVE: Preg, Serum: NEGATIVE

## 2023-11-15 MED ORDER — AZITHROMYCIN 250 MG PO TABS
1000.0000 mg | ORAL_TABLET | Freq: Once | ORAL | Status: AC
  Administered 2023-11-16: 1000 mg via ORAL
  Filled 2023-11-15: qty 4

## 2023-11-15 MED ORDER — CEFTRIAXONE SODIUM 250 MG IJ SOLR
250.0000 mg | Freq: Once | INTRAMUSCULAR | Status: AC
  Administered 2023-11-16: 250 mg via INTRAMUSCULAR
  Filled 2023-11-15: qty 250

## 2023-11-15 NOTE — ED Triage Notes (Addendum)
 Pt arrives with c/o SI and withdrawal from opiates. Pt reports her SO died one year ago, also lost her kids to the state, relapsed on Fentanyl, and needs help. Pt also requesting STD check.

## 2023-11-15 NOTE — ED Notes (Addendum)
 Pt. In burgundy scrubs and wanded by security. Pt. Has 1 belongings bag. Pt. Has 1 cell phone, 1 pr white shoes, 1 shirt, and1 pant. Pt. Belongings locked up in cabinet 1-8 at the nurses station. Pt. Has no purse or no money in her bag.

## 2023-11-15 NOTE — ED Provider Notes (Incomplete)
 [facility] EMERGENCY DEPARTMENT AT [facility] Provider Note   CSN: [id number] Arrival date & time: 11/15/23  2047     History {Add pertinent medical, surgical, social history, OB history to HPI:1} Chief Complaint  Patient presents with   Suicidal   Withdrawal    Kelly Hensley is a 28 y.o. female.  The history is provided by the patient and medical records.   65 old female with history of HPV, depression, opiate use disorder, presenting to the ED with suicidal ideation.  Patient states over the past year she has gone through a lot emotionally.  States her significant other and father of her children died about 1 year ago.  A few months after that she lost custody of her children, currently in care of the state.  Recently she has been feeling very depressed.  She has also relapsed on fentanyl.  States she is just not in her right state of mind.  Says started to have suicidal thoughts without specific plan, however this is scaring her.  She has felt this way before.  Denies any HI/AVH.  Also requesting screening for possible STD.  She tested positive for gonorrhea a few months ago, never completed her course of treatment.  Apparently sexual partner at the time also had it but was never treated so she is concerned they were just passing it back and forth.  He denies any pelvic pain, abdominal pain, or vaginal discharge currently.  Home Medications Prior to Admission medications   Medication Sig Start Date End Date Taking? Authorizing Provider  Acetaminophen (TYLENOL PO) Take 2 tablets by mouth as needed (pain). Patient not taking: Reported on 01/10/2023    [provider]  cholecalciferol (VITAMIN D3) 10 MCG (400 UNIT) TABS tablet Take 1 tablet (400 Units total) by mouth daily. 11/12/22   Alwyn Ren, MD  doxycycline (VIBRAMYCIN) 100 MG capsule Take 1 capsule (100 mg total) by mouth 2 (two) times daily. 07/25/23   Mardella Layman, MD  escitalopram (LEXAPRO) 10 MG  tablet Take 1 tablet (10 mg total) by mouth daily. 01/10/23   Mayers, Cari S, PA-C  Ferrous Sulfate (IRON PO) Take 2 tablets by mouth daily. Patient not taking: Reported on 01/10/2023    [provider]  naloxone [facility]) nasal spray 4 mg/0.1 mL 1 spray to the nostril Patient not taking: Reported on 01/10/2023 11/11/22   Alwyn Ren, MD  traZODone (DESYREL) 50 MG tablet Take 1 tablet (50 mg total) by mouth at bedtime as needed for sleep. 01/10/23   Mayers, Cari S, PA-C  norgestimate-ethinyl estradiol (ORTHO-CYCLEN,SPRINTEC,PREVIFEM) 0.25-35 MG-MCG tablet Take 1 tablet by mouth daily. Patient not taking: Reported on 03/27/2020 09/27/18 03/27/20  Dorathy Kinsman, CNM      Allergies    Patient has no known allergies.    Review of Systems   Review of Systems  Psychiatric/Behavioral:  Positive for suicidal ideas.   All other systems reviewed and are negative.   Physical Exam Updated Vital Signs BP 134/89 (BP Location: Left Arm)   Pulse 89   Temp 98.1 F (36.7 C) (Oral)   Resp 17   Ht 5\' 9"  (1.753 m)   Wt 89.4 kg   SpO2 100%   BMI 29.12 kg/m   Physical Exam Vitals and nursing note reviewed.  Constitutional:      Appearance: She is well-developed.  HENT:     Head: Normocephalic and atraumatic.  Eyes:     Conjunctiva/sclera: Conjunctivae normal.  Pupils: Pupils are equal, round, and reactive to light.  Cardiovascular:     Rate and Rhythm: Normal rate and regular rhythm.     Heart sounds: Normal heart sounds.  Pulmonary:     Effort: Pulmonary effort is normal. No respiratory distress.     Breath sounds: Normal breath sounds. No rhonchi.  Abdominal:     General: Bowel sounds are normal.     Palpations: Abdomen is soft.     Tenderness: There is no abdominal tenderness. There is no rebound.  Musculoskeletal:        General: Normal range of motion.     Cervical back: Normal range of motion.  Skin:    General: Skin is warm and dry.  Neurological:     Mental  Status: She is alert and oriented to person, place, and time.     ED Results / Procedures / Treatments   Labs (all labs ordered are listed, but only abnormal results are displayed) Labs Reviewed  COMPREHENSIVE METABOLIC PANEL - Abnormal; Notable for the following components:      Result Value   Glucose, Bld 67 (*)    Calcium 8.6 (*)    AST 13 (*)    All other components within normal limits  SALICYLATE LEVEL - Abnormal; Notable for the following components:   Salicylate Lvl <7.0 (*)    All other components within normal limits  ACETAMINOPHEN LEVEL - Abnormal; Notable for the following components:   Acetaminophen (Tylenol), Serum <10 (*)    All other components within normal limits  CBC - Abnormal; Notable for the following components:   MCV 74.5 (*)    MCH 25.7 (*)    All other components within normal limits  WET PREP, GENITAL  ETHANOL  HCG, SERUM, QUALITATIVE  RAPID URINE DRUG SCREEN, HOSP PERFORMED  RAPID HIV SCREEN (HIV 1/2 AB+AG)  RPR  GC/CHLAMYDIA PROBE AMP ([place]) NOT AT [facility]    EKG None  Radiology No results found.  Procedures Procedures  {Document cardiac monitor, telemetry assessment procedure when appropriate:1}  Medications Ordered in ED Medications - No data to display  ED Course/ Medical Decision Making/ A&P   {   Click here for ABCD2, HEART and other calculatorsREFRESH Note before signing :1}                              Medical Decision Making Amount and/or Complexity of Data Reviewed Labs: ordered. ECG/medicine tests: ordered and independent interpretation performed.  Risk Prescription drug management.   ***  {Document critical care time when appropriate:1} {Document review of labs and clinical decision tools ie heart score, Chads2Vasc2 etc:1}  {Document your independent review of radiology images, and any outside records:1} {Document your discussion with family members, caretakers, and with consultants:1} {Document social  determinants of health affecting pt's care:1} {Document your decision making why or why not admission, treatments were needed:1} Final Clinical Impression(s) / ED Diagnoses Final diagnoses:  None    Rx / DC Orders ED Discharge Orders     None

## 2023-11-16 ENCOUNTER — Other Ambulatory Visit (HOSPITAL_COMMUNITY)
Admission: EM | Admit: 2023-11-16 | Discharge: 2023-11-20 | Disposition: A | Discharge status: Home / Self Care | Payer: Medicaid Other | Attending: Psychiatry | Admitting: Psychiatry

## 2023-11-16 ENCOUNTER — Encounter (HOSPITAL_COMMUNITY): Payer: Self-pay | Admitting: Psychiatric/Mental Health

## 2023-11-16 DIAGNOSIS — Z9189 Other specified personal risk factors, not elsewhere classified: Secondary | ICD-10-CM

## 2023-11-16 DIAGNOSIS — Z91410 Personal history of adult physical and sexual abuse: Secondary | ICD-10-CM | POA: Insufficient documentation

## 2023-11-16 DIAGNOSIS — F1193 Opioid use, unspecified with withdrawal: Secondary | ICD-10-CM | POA: Diagnosis present

## 2023-11-16 DIAGNOSIS — F332 Major depressive disorder, recurrent severe without psychotic features: Secondary | ICD-10-CM | POA: Diagnosis not present

## 2023-11-16 DIAGNOSIS — N760 Acute vaginitis: Secondary | ICD-10-CM | POA: Insufficient documentation

## 2023-11-16 DIAGNOSIS — F32A Depression, unspecified: Secondary | ICD-10-CM | POA: Diagnosis not present

## 2023-11-16 DIAGNOSIS — F1290 Cannabis use, unspecified, uncomplicated: Secondary | ICD-10-CM | POA: Diagnosis present

## 2023-11-16 DIAGNOSIS — G4700 Insomnia, unspecified: Secondary | ICD-10-CM | POA: Insufficient documentation

## 2023-11-16 DIAGNOSIS — D582 Other hemoglobinopathies: Secondary | ICD-10-CM | POA: Diagnosis present

## 2023-11-16 DIAGNOSIS — F419 Anxiety disorder, unspecified: Secondary | ICD-10-CM | POA: Insufficient documentation

## 2023-11-16 DIAGNOSIS — Z62810 Personal history of physical and sexual abuse in childhood: Secondary | ICD-10-CM

## 2023-11-16 DIAGNOSIS — Z9151 Personal history of suicidal behavior: Secondary | ICD-10-CM

## 2023-11-16 DIAGNOSIS — F4310 Post-traumatic stress disorder, unspecified: Secondary | ICD-10-CM | POA: Diagnosis present

## 2023-11-16 DIAGNOSIS — R45851 Suicidal ideations: Secondary | ICD-10-CM | POA: Diagnosis not present

## 2023-11-16 DIAGNOSIS — Z8659 Personal history of other mental and behavioral disorders: Secondary | ICD-10-CM

## 2023-11-16 DIAGNOSIS — Z560 Unemployment, unspecified: Secondary | ICD-10-CM | POA: Insufficient documentation

## 2023-11-16 DIAGNOSIS — Z5900 Homelessness unspecified: Secondary | ICD-10-CM | POA: Insufficient documentation

## 2023-11-16 DIAGNOSIS — F1120 Opioid dependence, uncomplicated: Secondary | ICD-10-CM

## 2023-11-16 DIAGNOSIS — F19982 Other psychoactive substance use, unspecified with psychoactive substance-induced sleep disorder: Secondary | ICD-10-CM | POA: Diagnosis present

## 2023-11-16 DIAGNOSIS — D573 Sickle-cell trait: Secondary | ICD-10-CM | POA: Diagnosis not present

## 2023-11-16 DIAGNOSIS — Z79899 Other long term (current) drug therapy: Secondary | ICD-10-CM | POA: Insufficient documentation

## 2023-11-16 DIAGNOSIS — Z87828 Personal history of other (healed) physical injury and trauma: Secondary | ICD-10-CM

## 2023-11-16 DIAGNOSIS — B9689 Other specified bacterial agents as the cause of diseases classified elsewhere: Secondary | ICD-10-CM | POA: Diagnosis present

## 2023-11-16 DIAGNOSIS — Z9152 Personal history of nonsuicidal self-harm: Secondary | ICD-10-CM | POA: Insufficient documentation

## 2023-11-16 DIAGNOSIS — F1123 Opioid dependence with withdrawal: Secondary | ICD-10-CM | POA: Insufficient documentation

## 2023-11-16 DIAGNOSIS — Z87891 Personal history of nicotine dependence: Secondary | ICD-10-CM | POA: Insufficient documentation

## 2023-11-16 HISTORY — DX: Suicidal ideations: R45.851

## 2023-11-16 LAB — RAPID URINE DRUG SCREEN, HOSP PERFORMED
Amphetamines: NOT DETECTED
Barbiturates: NOT DETECTED
Benzodiazepines: NOT DETECTED
Cocaine: NOT DETECTED
Opiates: POSITIVE — AB
Tetrahydrocannabinol: NOT DETECTED

## 2023-11-16 LAB — GC/CHLAMYDIA PROBE AMP (~~LOC~~) NOT AT ARMC
Chlamydia: NEGATIVE
Comment: NEGATIVE
Comment: NORMAL
Neisseria Gonorrhea: NEGATIVE

## 2023-11-16 LAB — WET PREP, GENITAL
Trich, Wet Prep: NONE SEEN
WBC, Wet Prep HPF POC: <10 (ref <10)
Yeast Wet Prep HPF POC: NONE SEEN

## 2023-11-16 LAB — RAPID HIV SCREEN (HIV 1/2 AB+AG)
HIV 1/2 Antibodies: NONREACTIVE
HIV-1 P24 Antigen - HIV24: NONREACTIVE

## 2023-11-16 LAB — RPR: RPR Ser Ql: NONREACTIVE

## 2023-11-16 MED ORDER — HYDROXYZINE HCL 25 MG PO TABS: 25.0000 mg | ORAL_TABLET | Freq: Four times a day (QID) | ORAL | Status: DC | PRN

## 2023-11-16 MED ORDER — MAGNESIUM HYDROXIDE 400 MG/5ML PO SUSP: 30.0000 mL | Freq: Every day | ORAL | Status: DC | PRN

## 2023-11-16 MED ORDER — NAPROXEN 500 MG PO TABS
500.0000 mg | ORAL_TABLET | Freq: Two times a day (BID) | ORAL | Status: DC | PRN
  Administered 2023-11-16: 500 mg via ORAL
  Filled 2023-11-16: qty 1

## 2023-11-16 MED ORDER — LOPERAMIDE HCL 2 MG PO CAPS
2.0000 mg | ORAL_CAPSULE | ORAL | Status: DC | PRN
  Administered 2023-11-16: 4 mg via ORAL
  Filled 2023-11-16: qty 2

## 2023-11-16 MED ORDER — ALUM & MAG HYDROXIDE-SIMETH 200-200-20 MG/5ML PO SUSP: 30.0000 mL | ORAL | Status: DC | PRN

## 2023-11-16 MED ORDER — DIPHENHYDRAMINE HCL 50 MG PO CAPS: 50.0000 mg | ORAL_CAPSULE | Freq: Three times a day (TID) | ORAL | Status: DC | PRN

## 2023-11-16 MED ORDER — NAPROXEN 500 MG PO TABS
500.0000 mg | ORAL_TABLET | Freq: Two times a day (BID) | ORAL | Status: DC | PRN
  Administered 2023-11-16 – 2023-11-19 (×4): 500 mg via ORAL
  Filled 2023-11-16 (×4): qty 1

## 2023-11-16 MED ORDER — BUPRENORPHINE HCL-NALOXONE HCL 8-2 MG SL SUBL: 1.0000 | SUBLINGUAL_TABLET | Freq: Three times a day (TID) | SUBLINGUAL | Status: DC | PRN

## 2023-11-16 MED ORDER — LOPERAMIDE HCL 2 MG PO CAPS
2.0000 mg | ORAL_CAPSULE | ORAL | Status: DC | PRN
  Administered 2023-11-16: 4 mg via ORAL
  Filled 2023-11-16 (×2): qty 1

## 2023-11-16 MED ORDER — METHOCARBAMOL 500 MG PO TABS
500.0000 mg | ORAL_TABLET | Freq: Three times a day (TID) | ORAL | Status: DC | PRN
  Administered 2023-11-16 – 2023-11-19 (×4): 500 mg via ORAL
  Filled 2023-11-16 (×4): qty 1

## 2023-11-16 MED ORDER — DICYCLOMINE HCL 20 MG PO TABS
20.0000 mg | ORAL_TABLET | Freq: Four times a day (QID) | ORAL | Status: DC | PRN
  Administered 2023-11-16 – 2023-11-18 (×2): 20 mg via ORAL
  Filled 2023-11-16 (×2): qty 1

## 2023-11-16 MED ORDER — STERILE WATER FOR INJECTION IJ SOLN
INTRAMUSCULAR | Status: AC
  Administered 2023-11-16: 0.9 mL
  Filled 2023-11-16: qty 10

## 2023-11-16 MED ORDER — LORAZEPAM 2 MG/ML IJ SOLN: 2.0000 mg | Freq: Three times a day (TID) | INTRAMUSCULAR | Status: DC | PRN

## 2023-11-16 MED ORDER — ACETAMINOPHEN 325 MG PO TABS
650.0000 mg | ORAL_TABLET | Freq: Four times a day (QID) | ORAL | Status: DC | PRN
  Administered 2023-11-16 – 2023-11-20 (×4): 650 mg via ORAL
  Filled 2023-11-16 (×4): qty 2

## 2023-11-16 MED ORDER — DICYCLOMINE HCL 20 MG PO TABS: 20.0000 mg | ORAL_TABLET | Freq: Four times a day (QID) | ORAL | Status: DC | PRN

## 2023-11-16 MED ORDER — HALOPERIDOL LACTATE 5 MG/ML IJ SOLN: 5.0000 mg | Freq: Three times a day (TID) | INTRAMUSCULAR | Status: DC | PRN

## 2023-11-16 MED ORDER — ONDANSETRON 4 MG PO TBDP
4.0000 mg | ORAL_TABLET | Freq: Four times a day (QID) | ORAL | Status: DC | PRN
  Administered 2023-11-16 (×2): 4 mg via ORAL
  Filled 2023-11-16 (×2): qty 1

## 2023-11-16 MED ORDER — HALOPERIDOL LACTATE 5 MG/ML IJ SOLN: 10.0000 mg | Freq: Three times a day (TID) | INTRAMUSCULAR | Status: DC | PRN

## 2023-11-16 MED ORDER — DIPHENHYDRAMINE HCL 50 MG/ML IJ SOLN: 50.0000 mg | Freq: Three times a day (TID) | INTRAMUSCULAR | Status: DC | PRN

## 2023-11-16 MED ORDER — HYDROXYZINE HCL 25 MG PO TABS
25.0000 mg | ORAL_TABLET | Freq: Four times a day (QID) | ORAL | Status: DC | PRN
  Administered 2023-11-16 – 2023-11-19 (×6): 25 mg via ORAL
  Filled 2023-11-16 (×6): qty 1

## 2023-11-16 MED ORDER — TRAZODONE HCL 50 MG PO TABS
50.0000 mg | ORAL_TABLET | Freq: Every evening | ORAL | Status: DC | PRN
  Administered 2023-11-16 – 2023-11-19 (×4): 50 mg via ORAL
  Filled 2023-11-16: qty 1
  Filled 2023-11-16: qty 14
  Filled 2023-11-16 (×3): qty 1

## 2023-11-16 MED ORDER — HALOPERIDOL 5 MG PO TABS: 5.0000 mg | ORAL_TABLET | Freq: Three times a day (TID) | ORAL | Status: DC | PRN

## 2023-11-16 MED ORDER — METHOCARBAMOL 500 MG PO TABS: 500.0000 mg | ORAL_TABLET | Freq: Three times a day (TID) | ORAL | Status: DC | PRN

## 2023-11-16 MED ORDER — ONDANSETRON 4 MG PO TBDP
4.0000 mg | ORAL_TABLET | Freq: Four times a day (QID) | ORAL | Status: DC | PRN
  Administered 2023-11-16: 4 mg via ORAL
  Filled 2023-11-16: qty 1

## 2023-11-16 MED ORDER — OLANZAPINE 10 MG IM SOLR: 10.0000 mg | Freq: Once | INTRAMUSCULAR | Status: DC | PRN

## 2023-11-16 NOTE — ED Notes (Signed)
 Pt reporting stomach discomfort and some diarrhea from withdrawals. Requested medication from provider. PRNs ordered.

## 2023-11-16 NOTE — ED Notes (Signed)
 Patient admitted as a direct admission from [facility].  Patient here for detox from fentanyl use.  Patient states she snorts the drug.  She is calm and cooperative with admission process and was oriented to the unit and shown to her room.  Patient offered dinner.  She has mild opiate withdrawal symptoms at this time.  Patient has runny nose, body aches ans stomach cramping.  No nausea or diarrhea.  Patient given PO comfort med for symptoms.  Denies avh shi or plan.  Will monitor.

## 2023-11-16 NOTE — ED Provider Notes (Signed)
 Patient evaluated by behavioral health team this morning and felt to be beneficial from inpatient psychiatric evaluation and management.  Patient is expressing concern for opioid withdrawal, with diarrhea and nausea.  Although she had been provided symptomatic management for that, I do think it is reasonable to initiate Suboxone at this time, 8 mg 3 times daily PRN based on COWS > 10.   Terald Sleeper, MD 11/16/23 1017

## 2023-11-16 NOTE — ED Notes (Signed)
 Patient seen resting in bed on arrival to shift without any distress noted. She is calm and cooperative and reports anxiety and depression 6. She denies SIHI and AVH. He reports withdrawal symptoms of chills, nausea, and diarrhea. Patient reports feeling agitation because of her currently situation. She reports appetite fair and poor sleep. Staff will continue to monitor safety and for changes in condition.

## 2023-11-16 NOTE — BH Assessment (Signed)
 Pt was deferred to [facility] for telepsych assessment. Consult time and provider name pending. [place] coordinator and pt's care team contacted via secure chat.

## 2023-11-16 NOTE — ED Notes (Signed)
 Patient wanded by security prior to transport to [facility]

## 2023-11-16 NOTE — BH Assessment (Signed)
 LCSW Progress Note:  Kelly Hensley MRN: [id number] Date: 11/16/2023 Time: 1:01 PM  Alona Bene, NP, has recommended inpatient psychiatric treatment or [facility] for the patient. We are currently awaiting updates from [facility] [place], RN) regarding bed availability at [facility]. If no beds are available, we will follow up with the [facility] to assess their bed availability for this patient.

## 2023-11-16 NOTE — Consult Note (Signed)
 [facility] Consult Note  Patient Name: Kelly Hensley MRN: [id number] DOB: 04/09/1996 DATE OF Consult: 11/16/2023  PRIMARY PSYCHIATRIC DIAGNOSES  1.  Major Depressive Disorder, Recurrent, Severe, without Psychotic Features  2.  Opioid Use Disorder, Severe, Dependence 3.  Suicidal Ideations   RECOMMENDATIONS  Recommendations: Medication recommendations:  -- Agree with current opiate withdrawal management -- Start Hydroxyzine 25mg  po TID PRN for anxiety  -- Zyprexa 10mg  PO/IM Q6H PRN for acute agitation   Non-Medication/therapeutic recommendations:  -- Monitor opiate withdrawal- COWS -- Inpatient psychiatric treatment -- Suicide precautions  Is inpatient psychiatric hospitalization recommended for this patient? { -- Yes, patient meets criteria for inpatient psychiatric treatment. Would benefit from dual diagnosis treatment facility, if available.   Follow-Up Telepsychiatry C/L services: We will continue to follow this patient with you until stabilized or discharged.  If you have any questions or concerns, please call our TeleCare Coordination service at  [phone] and ask for myself or the provider on-call.  Communication: Treatment team members (and family members if applicable) who were involved in treatment/care discussions and planning, and with whom we spoke or engaged with via secure text/chat, include the following: ED Primary Team  Thank you for involving [place] in the care of this patient. If you have any additional questions or concerns, please call [phone] and ask for me or the provider on-call.  TELEPSYCHIATRY ATTESTATION & CONSENT  As the provider for this telehealth consult, I attest that I verified the patient's identity using two separate identifiers, introduced myself to the patient, provided my credentials, disclosed my location, and performed this encounter via a HIPAA-compliant, real-time, face-to-face, two-way, interactive audio and video platform and with  the full consent and agreement of the patient (or guardian as applicable.)  Patient physical location: ED in [facility]. Telehealth provider physical location: home office in state of [place].  Video start time: 0632 [facility] Time) Video end time: 0652 (Central Time)  IDENTIFYING DATA  Kelly Hensley is a 28 y.o. year-old female for whom a psychiatric consultation has been ordered by the primary provider. The patient was identified using two separate identifiers.  CHIEF COMPLAINT/REASON FOR CONSULT  Suicidal Ideations  HISTORY OF PRESENT ILLNESS (HPI)  The patient is a 28yo female, with past psychiatric history of substance induced mood disorder, severe opioid use disorder, tobacco use, and major depressive disorder, who presented to the emergency department with concern for worsening depression and suicidal ideations. Patient shares she is currently going through fentanyl withdrawal and still struggling with the loss of her children's father last year. Also is very depressed because she lost custody of her children to the state due to her homelessness. Patient states she had been sober from substances for 1 year but relapsed approximately 4 months ago. She is now smoking fentanyl daily, approximately 50mg . Last used yesterday afternoon. She has history of detox/ rehabilitation programs but none recently. Denies legal history attributed to drug use. Patient states "I don't have any yet but can feel it coming" when asked about current withdrawal symptoms and cravings. Denies additional drug use. Smokes cigarettes  Reports drinking alcohol infrequently, though has history of misuse.   Patient endorses daily anxiety and depression related to stressors and recent relapse. Complains of trouble of sleeping, decreased appetite with weight loss, hopelessness, worthlessness, guilt/shame. Endorses passive suicidal ideations without plan, on and off for the last 3 months. No active SI/HI. One prior  suicide attempt. Patient states if she does not receive treatment at this  time she is fearful of what she may do. Does not feel safe leaving the hospital. Denies symptoms of psychosis: no hallucinations, no paranoia, no delusions apparent otherwise. She does not appear to be internally preoccupied, no thought blocking noted, not responding to internal stimuli. No symptoms indicative of mania.    PAST PSYCHIATRIC HISTORY  Prior psychiatric hospitalizations- [facility] 11/2022  No outpatient treatment at this time. No current  Hydroxyzine, Trazodone    Otherwise as per HPI above.  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    Migraines    Morbid obesity (HCC)    Sickle cell trait (HCC)      HOME MEDICATIONS  Facility Ordered Medications  Medication   [COMPLETED] cefTRIAXone (ROCEPHIN) injection 250 mg   [COMPLETED] azithromycin (ZITHROMAX) tablet 1,000 mg   [COMPLETED] sterile water (preservative free) injection   dicyclomine (BENTYL) tablet 20 mg   hydrOXYzine (ATARAX) tablet 25 mg   loperamide (IMODIUM) capsule 2-4 mg   methocarbamol (ROBAXIN) tablet 500 mg   naproxen (NAPROSYN) tablet 500 mg   ondansetron (ZOFRAN-ODT) disintegrating tablet 4 mg   PTA Medications  Medication Sig   Ferrous Sulfate (IRON PO) Take 2 tablets by mouth daily. (Patient not taking: Reported on 01/10/2023)   Acetaminophen (TYLENOL PO) Take 2 tablets by mouth as needed (pain). (Patient not taking: Reported on 01/10/2023)   cholecalciferol (VITAMIN D3) 10 MCG (400 UNIT) TABS tablet Take 1 tablet (400 Units total) by mouth daily.   naloxone (NARCAN) nasal spray 4 mg/0.1 mL 1 spray to the nostril (Patient not taking: Reported on 01/10/2023)   escitalopram (LEXAPRO) 10 MG tablet Take 1 tablet (10 mg total) by mouth daily.   traZODone (DESYREL) 50 MG tablet Take 1 tablet  (50 mg total) by mouth at bedtime as needed for sleep.   doxycycline (VIBRAMYCIN) 100 MG capsule Take 1 capsule (100 mg total) by mouth 2 (two) times daily.     ALLERGIES  No Known Allergies  SOCIAL & SUBSTANCE USE HISTORY  Social History   Socioeconomic History   Marital status: Single    Spouse name: Not on file   Number of children: 2   Years of education: 12   Highest education level: High school graduate  Occupational History   Not on file  Tobacco Use   Smoking status: Every Day    Current packs/day: 0.00    Average packs/day: 1 pack/day for 10.0 years (10.0 ttl pk-yrs)    Types: Cigarettes    Start date: 02/16/2005    Last attempt to quit: 02/17/2015    Years since quitting: 8.7   Smokeless tobacco: Never  Vaping Use   Vaping status: Never Used  Substance and Sexual Activity   Alcohol use: Yes    Alcohol/week: 6.0 standard drinks of alcohol    Types: 3 Shots of liquor, 3 Glasses of wine per week    Comment: 3 vodkas weekly; wine 3 x week   Drug use: Yes    Frequency: 7.0 times per week    Types: Marijuana, Heroin, Fentanyl    Comment: daily use - ''.6 grams fentanyl snorting''   Sexual activity: Yes    Birth control/protection: Implant  Other Topics Concern   Not on file  Social History Narrative   Not on file  Social Drivers of Health   Financial Resource Strain: Patient Declined (11/17/2022)   Received from [facility], [facility]   Overall Financial Resource Strain (CARDIA)    Difficulty of Paying Living Expenses: Patient declined  Food Insecurity: Food Insecurity Present (11/10/2022)   Hunger Vital Sign    Worried About Running Out of Food in the Last Year: Sometimes true    Ran Out of Food in the Last Year: Sometimes true  Transportation Needs: Unmet Transportation Needs (11/21/2022)   Received from [employer], [facility]   [facility] - Transportation    Lack of Transportation (Medical): Not on file    Lack of Transportation (Non-Medical): Yes   Physical Activity: Not on file  Stress: Patient Declined (11/17/2022)   Received from [facility], [facility] - Occupational Stress Questionnaire    Feeling of Stress : Patient declined  Social Connections: Unknown (11/16/2022)   Received from [facility], [facility]   Social Network    Social Network: Not on file   Social History   Tobacco Use  Smoking Status Every Day   Current packs/day: 0.00   Average packs/day: 1 pack/day for 10.0 years (10.0 ttl pk-yrs)   Types: Cigarettes   Start date: 02/16/2005   Last attempt to quit: 02/17/2015   Years since quitting: 8.7  Smokeless Tobacco Never   Social History   Substance and Sexual Activity  Alcohol Use Yes   Alcohol/week: 6.0 standard drinks of alcohol   Types: 3 Shots of liquor, 3 Glasses of wine per week   Comment: 3 vodkas weekly; wine 3 x week   Social History   Substance and Sexual Activity  Drug Use Yes   Frequency: 7.0 times per week   Types: Marijuana, Heroin, Fentanyl   Comment: daily use - ''.6 grams fentanyl snorting''    Additional pertinent information: homelessness, unemployed    FAMILY HISTORY  Family History  Problem Relation Age of Onset   Diabetes Mother    Sickle cell anemia Father      MENTAL STATUS EXAM (MSE)  Mental Status Exam: General Appearance: Fairly Groomed  Orientation:  Full (Time, Place, and Person)  Memory:  Recent;   Good  Concentration:  Concentration: Good  Recall:  Good  Attention  Good  Eye Contact:  Good  Speech:  Clear and Coherent  Language:  Good  Volume:  Normal  Mood: Depressed   Affect:  Appropriate  Thought Process:  Linear  Thought Content:  Logical  Suicidal Thoughts:  Yes.  without intent/plan  Homicidal Thoughts:  No  Judgement:  Poor  Insight:  Lacking  Psychomotor Activity:  Normal  Akathisia:  No  Fund of Knowledge:  Good    Assets:  Communication Skills Desire for Improvement Physical Health   Cognition:  WNL  ADL's:  Intact  AIMS (if indicated):       VITALS  Blood pressure 128/75, pulse 72, temperature 98.4 F (36.9 C), temperature source Oral, resp. rate 18, height 5\' 9"  (1.753 m), weight 89.4 kg, SpO2 98%.  LABS  Admission on 11/15/2023  Component Date Value Ref Range Status   Sodium 11/15/2023 137  135 - 145 mmol/L Final   Potassium 11/15/2023 3.6  3.5 - 5.1 mmol/L Final   Chloride 11/15/2023 103  98 - 111 mmol/L Final   CO2 11/15/2023 25  22 - 32 mmol/L Final   Glucose, Bld 11/15/2023 67 (L)  70 - 99 mg/dL Final  Glucose reference range applies only to samples taken after fasting for at least 8 hours.   BUN 11/15/2023 11  6 - 20 mg/dL Final   Creatinine, Ser 11/15/2023 0.84  0.44 - 1.00 mg/dL Final   Calcium 16/06/9603 8.6 (L)  8.9 - 10.3 mg/dL Final   Total Protein 54/05/8118 7.2  6.5 - 8.1 g/dL Final   Albumin 14/78/2956 3.9  3.5 - 5.0 g/dL Final   AST 21/30/8657 13 (L)  15 - 41 U/L Final   ALT 11/15/2023 11  0 - 44 U/L Final   Alkaline Phosphatase 11/15/2023 48  38 - 126 U/L Final   Total Bilirubin 11/15/2023 0.2  0.0 - 1.2 mg/dL Final   GFR, Estimated 11/15/2023 >60  >60 mL/min Final   Comment: (NOTE) Calculated using the CKD-EPI Creatinine Equation (2021)    Anion gap 11/15/2023 9  5 - 15 Final   Performed at [facility], [address], [place], [place] [address]   Alcohol, Ethyl (B) 11/15/2023 <10  <10 mg/dL Final   Comment: (NOTE) Lowest detectable limit for serum alcohol is 10 mg/dL.  For medical purposes only. Performed at [facility], [address] [place], [place] [address]    Salicylate Lvl 11/15/2023 <7.0 (L)  7.0 - 30.0 mg/dL Final   Performed at [facility], [address], [place], [place] [address]   Acetaminophen (Tylenol), Serum 11/15/2023 <10 (L)  10 - 30 ug/mL Final   Comment: (NOTE) Therapeutic concentrations vary significantly. A range of 10-30 ug/mL  may be an  effective concentration for many patients. However, some  are best treated at concentrations outside of this range. Acetaminophen concentrations >150 ug/mL at 4 hours after ingestion  and >50 ug/mL at 12 hours after ingestion are often associated with  toxic reactions.  Performed at [facility], [address], [place], [place] [address]    WBC 11/15/2023 4.1  4.0 - 10.5 K/uL Final   RBC 11/15/2023 4.86  3.87 - 5.11 MIL/uL Final   Hemoglobin 11/15/2023 12.5  12.0 - 15.0 g/dL Final   HCT 72/53/6644 36.2  36.0 - 46.0 % Final   MCV 11/15/2023 74.5 (L)  80.0 - 100.0 fL Final   MCH 11/15/2023 25.7 (L)  26.0 - 34.0 pg Final   MCHC 11/15/2023 34.5  30.0 - 36.0 g/dL Final   RDW 03/47/4259 14.0  11.5 - 15.5 % Final   Platelets 11/15/2023 276  150 - 400 K/uL Final   nRBC 11/15/2023 0.0  0.0 - 0.2 % Final   Performed at [facility], [address], [place], [place] [address]   Opiates 11/15/2023 POSITIVE (A)  NONE DETECTED Final   Cocaine 11/15/2023 NONE DETECTED  NONE DETECTED Final   Benzodiazepines 11/15/2023 NONE DETECTED  NONE DETECTED Final   Amphetamines 11/15/2023 NONE DETECTED  NONE DETECTED Final   Tetrahydrocannabinol 11/15/2023 NONE DETECTED  NONE DETECTED Final   Barbiturates 11/15/2023 NONE DETECTED  NONE DETECTED Final   Comment: (NOTE) DRUG SCREEN FOR MEDICAL PURPOSES ONLY.  IF CONFIRMATION IS NEEDED FOR ANY PURPOSE, NOTIFY LAB WITHIN 5 DAYS.  LOWEST DETECTABLE LIMITS FOR URINE DRUG SCREEN Drug Class                     Cutoff (ng/mL) Amphetamine and metabolites    1000 Barbiturate and metabolites    200 Benzodiazepine  200 Opiates and metabolites        300 Cocaine and metabolites        300 THC                            50 Performed at [facility], [address], [place], [place] [address]    Preg, Serum 11/15/2023 NEGATIVE  NEGATIVE Final   Comment:        THE SENSITIVITY OF  THIS METHODOLOGY IS >10 mIU/mL. Performed at [facility], [address] [place], [place] [address]    HIV-1 P24 Antigen - HIV24 11/15/2023 NON REACTIVE  NON REACTIVE Final   Comment: (NOTE) Detection of p24 may be inhibited by biotin in the sample, causing false negative results in acute infection.    HIV 1/2 Antibodies 11/15/2023 NON REACTIVE  NON REACTIVE Final   Interpretation (HIV Ag Ab) 11/15/2023 A non reactive test result means that HIV 1 or HIV 2 antibodies and HIV 1 p24 antigen were not detected in the specimen.   Final   Performed at [facility], [address], [place], [place] [address]   Yeast Wet Prep HPF POC 11/16/2023 NONE SEEN  NONE SEEN Final   Trich, Wet Prep 11/16/2023 NONE SEEN  NONE SEEN Final   Clue Cells Wet Prep HPF POC 11/16/2023 PRESENT (A)  NONE SEEN Final   WBC, Wet Prep HPF POC 11/16/2023 <10  <10 Final   Sperm 11/16/2023 PRESENT   Final   Performed at [facility], [address], [place], [place] [address]    PSYCHIATRIC REVIEW OF SYSTEMS (ROS)  ROS: Notable for the following relevant positive findings: Review of Systems  Psychiatric/Behavioral:  Positive for depression, substance abuse and suicidal ideas. Negative for hallucinations. The patient is nervous/anxious and has insomnia.     Additional findings:      Musculoskeletal: No abnormal movements observed      Gait & Station: Normal      Pain Screening: Denies      Nutrition & Dental Concerns: Decrease in food intake and/or loss of appetite  RISK FORMULATION/ASSESSMENT  Is the patient experiencing any suicidal or homicidal ideations: Yes       Explain if yes: SI without plan  Protective factors considered for safety management: access to care, willingness to seek treatment, future oriented  Risk factors/concerns considered for safety management: homelessness  Prior attempt Depression Substance abuse/dependence Recent  loss Hopelessness Unmarried  Is there a safety management plan with the patient and treatment team to minimize risk factors and promote protective factors: Yes           Explain: Currently in the ED, suicide precautions, inpatient hospitalization  Is crisis care placement or psychiatric hospitalization recommended: Yes     Based on my current evaluation and risk assessment, patient is determined at this time to be at:  High risk  *RISK ASSESSMENT Risk assessment is a dynamic process; it is possible that this patient's condition, and risk level, may change. This should be re-evaluated and managed over time as appropriate. Please re-consult psychiatric consult services if additional assistance is needed in terms of risk assessment and management. If your team decides to discharge this patient, please advise the patient how to best access emergency psychiatric services, or to call 911, if their condition worsens or they feel unsafe in any way.   Assunta Gambles, NP Telepsychiatry Consult Services

## 2023-11-16 NOTE — ED Notes (Signed)
 Patients belongings moved to [facility] and given to staff.

## 2023-11-16 NOTE — BH Assessment (Signed)
 [facility] coordinator reported that pt's updated consult time is now 07:30. This clinician added day shift TTS clinician to secure chat .

## 2023-11-16 NOTE — BH Assessment (Addendum)
 LCSW Progress Note:  Ellis Savage MRN: [id number] Date: 11/16/2023 Time: 1:29 PM  Alona Bene, NP, has recommended inpatient psychiatric treatment or [facility] for the patient. We are currently awaiting updates from [facility], RN) regarding bed availability at [facility]. If no beds are available, we will follow up with the [facility] to assess their bed availability for this patient.  Liborio Nixon, RN, has confirmed that the patient can go to the [facility], and the patient's care will be coordinated accordingly.

## 2023-11-16 NOTE — ED Notes (Signed)
 Nurse received pt calm and cooperative. Pt is currently watching TV in room.

## 2023-11-16 NOTE — BH Assessment (Signed)
 [facility] provider, Tyler Aas, NP will see pt at 08:15.

## 2023-11-16 NOTE — ED Notes (Signed)
 Patient is resting in bed with eyes closed without any distress noted with even unlabored breathing. Staff will continue to monitor safety and for changes in condition.

## 2023-11-17 ENCOUNTER — Encounter (HOSPITAL_COMMUNITY): Payer: Self-pay | Admitting: Psychiatry

## 2023-11-17 DIAGNOSIS — F4310 Post-traumatic stress disorder, unspecified: Secondary | ICD-10-CM | POA: Diagnosis present

## 2023-11-17 DIAGNOSIS — Z79899 Other long term (current) drug therapy: Secondary | ICD-10-CM | POA: Diagnosis not present

## 2023-11-17 DIAGNOSIS — Z8659 Personal history of other mental and behavioral disorders: Secondary | ICD-10-CM

## 2023-11-17 DIAGNOSIS — F332 Major depressive disorder, recurrent severe without psychotic features: Secondary | ICD-10-CM | POA: Diagnosis not present

## 2023-11-17 DIAGNOSIS — Z87828 Personal history of other (healed) physical injury and trauma: Secondary | ICD-10-CM

## 2023-11-17 DIAGNOSIS — D573 Sickle-cell trait: Secondary | ICD-10-CM | POA: Diagnosis not present

## 2023-11-17 DIAGNOSIS — F1120 Opioid dependence, uncomplicated: Secondary | ICD-10-CM | POA: Diagnosis not present

## 2023-11-17 HISTORY — DX: Post-traumatic stress disorder, unspecified: F43.10

## 2023-11-17 HISTORY — DX: Personal history of other mental and behavioral disorders: Z86.59

## 2023-11-17 MED ORDER — CLONIDINE HCL 0.1 MG PO TABS
0.1000 mg | ORAL_TABLET | Freq: Four times a day (QID) | ORAL | Status: DC
  Administered 2023-11-17 (×4): 0.1 mg via ORAL
  Filled 2023-11-17 (×5): qty 1

## 2023-11-17 MED ORDER — METRONIDAZOLE 250 MG PO TABS
500.0000 mg | ORAL_TABLET | Freq: Two times a day (BID) | ORAL | Status: DC
  Administered 2023-11-17 (×2): 500 mg via ORAL
  Filled 2023-11-17 (×3): qty 2

## 2023-11-17 MED ORDER — CLONIDINE HCL 0.1 MG PO TABS: 0.1000 mg | ORAL_TABLET | ORAL | Status: DC

## 2023-11-17 MED ORDER — SERTRALINE HCL 25 MG PO TABS
25.0000 mg | ORAL_TABLET | ORAL | Status: DC
  Administered 2023-11-18 – 2023-11-20 (×3): 25 mg via ORAL
  Filled 2023-11-17: qty 1
  Filled 2023-11-17: qty 7
  Filled 2023-11-17 (×2): qty 1

## 2023-11-17 MED ORDER — CLONIDINE HCL 0.1 MG PO TABS: 0.1000 mg | ORAL_TABLET | Freq: Every day | ORAL | Status: DC

## 2023-11-17 NOTE — ED Notes (Signed)
 Patient in the Dayroom calm and composed watching TV with other patients.  NAD,Denies needing anything atm.  Respirations even and unlabored. Will keep monitoring for safety

## 2023-11-17 NOTE — ED Notes (Signed)
 Patient in the bedroom calm and sleeping.  NAD.Respirations even and unlabored.  Will keep monitoring for safety
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
## 2023-11-17 NOTE — Discharge Instructions (Signed)
 Suboxone Clinics in [place], [place]  [facility]  [address] [place], [place] [address] [phone] [email or web address] Address: [address] [place], [place] [address] Phone: [phone]  Dear Kelly Hensley,   Please continue your meds as directed until you see your outpatient psychiatrist. If you do run out of your meds before your psychiatrist appointment, please reach out to them for refill the meds until you see them. You will be allowed to request a refill from the office as long as you have an appointment with them already.  The hospital will not be able to refill your meds after we give you the 30 day prescription.  Non-Emergent / Urgent  [facility] [address] SECOND FLOOR [place], [place] [address] [phone] OUTPATIENT Walk-in information: Please note, all walk-ins are first come & first serve, with limited number of availability.  Please note that to be eligible for services you must bring: ID or a piece of mail with your name [facility] address  Therapist for therapy:  Monday & Wednesdays: Please ARRIVE at 7:15 AM for registration Will START at 8:00 AM Every 1st & 2nd Friday of the month: Please ARRIVE at 10:15 AM for registration Will START at 1 PM - 5 PM  Psychiatrist for medication management: Monday - Friday:  Please ARRIVE at 7:15 AM for registration Will START at 8:00 AM  Regretfully, due to limited availability, please be aware that you may not been seen on the same day as walk-in. Please consider making an appoint or try again. Thank you for your patience and understanding. ________________________________________________________  [facility]:  [address] FIRST FLOOR.  [place], [place] [address].  [phone]  Mobile Crisis Response Teams Listed by counties in vicinity of [facility] providers [facility]  [phone] [facility] [phone] [facility] [phone] [facility] [phone] [place]                * [facility] [phone]                * [facility] [phone] [facility] [phone] [facility]  [phone] * [facility] [phone] ________________________________________________________  To see which pharmacy near you is the CHEAPEST for certain medications, please use GoodRx. It is free website and has a free phone app.    Also consider looking at [facility] $4.00 or [facility]'s $7.00 prescription list. Both are free to view if googled "walmart $4 prescription" and "public's $7 prescription". These are set prices, no insurance required. [facility]'s low cost medications: $4-$15 for 30days prescriptions or $10-$38 for 90days prescriptions  ________________________________________________________  Difficulties with sleep?   Can also use this free app for insomnia called CBT-I. Let your doctors and therapists know so they can help with extra tips and tricks or for guidance and accountability. NO ADDS on the app.     SUBSTANCE USE TREATMENT for Medicaid and State Funded/IPRS  [facility] ([facility]) [address], [place], [address] [phone] phone NOTE: [facility] is no longer offering IOP services.  Serves those who are low-income or have no insurance.  [facility] [address], [place], [place], [address] [phone] phone [phone] fax NOTE: Does have Substance Abuse-Intensive Outpatient Program [facility]) as well as transitional housing if eligible.  [facility] [address] [place], [place], [address] [phone] phone  [phone] fax  [facility] [address] [place], [place],  [address] [phone] phone [phone] fax  HALFWAY HOUSES:  [facility] [phone]  [employer].oxfordvacancies.com  12 STEP PROGRAMS:  Alcoholics Anonymous of [place] Software[employer]alet.be  Narcotics Anonymous of [place] [employer]ct.dk  Al-Anon of [place], [place] www.[place]alanon.org/find-meetings.html  Nar-Anon https://nar-anon.org/find-a-meetin  List of Residential placements:   [facility] in [place]: [phone]  [facility]: [phone]  [place], [place] [phone]: Female and female facility; 30-day program: (uninsured and Medicaid such as Laurena Bering, [place], [place], partners)  [facility]: [phone]; men and women's facility; 28 days; Can have Medicaid tailored plan Tour manager or Partners)  [facility]: [phone] Karoline Caldwell or Larita Fife; 28 day program; must be fully detox; tailored Medicaid or no insurance  [address] in [place], [place]; [phone]; 28 day all males program; no insurance accepted  [facility] in [place]: Gabriel Rung [phone] (no insurance or Medicaid only); 90 days; outpatient services but provide housing in apartments downtown [place]  RTS Admission: [phone]: Patient must complete phone screening for placement: [place], [place]; 6 month program; uninsured, Medicaid, and [employer].   [facility]: no insurance required; [phone]  [facility]: [phone]; Intake: Molly Maduro; Must fill out application online; Alecia Lemming Delay [phone] x [address] [facility] in [place], [place]: [phone]; Admissions Coordinators Mr. Maurine Minister or Barron Alvine; 90 day program.  [facility]: [place], [place] [phone]; Co-Ed 9 month to a year program; Online application; Men entry fee is $500 (6-30months);  [employer]: [address] [place], [place] [address];  no fee or insurance required; minimum of 2 years; Highly structured; work based; Intake Coordinator is Thayer Ohm [phone]  [facility] in [place], [place]: [phone]; Fax number is [phone]; website: www.[facility].org; Requires 3-6 page autobiography; 2 year program (18 months and then 89month transitional housing); Admission fee is $300; no insurance needed; work Automotive engineer in [place], [place]: [employer] Desk Staff: Danise Edge [phone]: They have a Men's Regenerations Program 6-60months. Free program; There is an initial $300 fee however, they are willing to work with patients regarding that. Application is online.  [facility] at [facility]: Admissions [phone] Doran Heater ext [phone]; Any 7-90 day program is out of pocket; 12 month program is free of charge; there is a $275 entry fee; Patient is responsible for own transportation
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
## 2023-11-17 NOTE — ED Notes (Signed)
 Patient is resting in bed with eyes closed, even unlabored breathing. Staff will continue to monitor safety and for changes in condition.

## 2023-11-17 NOTE — ED Notes (Signed)
Speaking with provider. ?

## 2023-11-18 ENCOUNTER — Encounter (HOSPITAL_COMMUNITY): Payer: Self-pay | Admitting: Psychiatry

## 2023-11-18 DIAGNOSIS — F32A Depression, unspecified: Secondary | ICD-10-CM | POA: Diagnosis not present

## 2023-11-18 DIAGNOSIS — Z79899 Other long term (current) drug therapy: Secondary | ICD-10-CM | POA: Diagnosis not present

## 2023-11-18 DIAGNOSIS — B9689 Other specified bacterial agents as the cause of diseases classified elsewhere: Secondary | ICD-10-CM

## 2023-11-18 DIAGNOSIS — F332 Major depressive disorder, recurrent severe without psychotic features: Secondary | ICD-10-CM | POA: Diagnosis not present

## 2023-11-18 DIAGNOSIS — F1290 Cannabis use, unspecified, uncomplicated: Secondary | ICD-10-CM

## 2023-11-18 DIAGNOSIS — D573 Sickle-cell trait: Secondary | ICD-10-CM | POA: Diagnosis not present

## 2023-11-18 DIAGNOSIS — F1120 Opioid dependence, uncomplicated: Secondary | ICD-10-CM | POA: Diagnosis not present

## 2023-11-18 HISTORY — DX: Cannabis use, unspecified, uncomplicated: F12.90

## 2023-11-18 HISTORY — DX: Other specified bacterial agents as the cause of diseases classified elsewhere: B96.89

## 2023-11-18 LAB — VITAMIN B12: Vitamin B-12: 275 pg/mL (ref 180–914)

## 2023-11-18 LAB — VITAMIN D 25 HYDROXY (VIT D DEFICIENCY, FRACTURES): Vit D, 25-Hydroxy: 35.03 ng/mL (ref 30–100)

## 2023-11-18 MED ORDER — CLONIDINE HCL 0.1 MG PO TABS: 0.1000 mg | ORAL_TABLET | Freq: Every day | ORAL | Status: DC

## 2023-11-18 MED ORDER — METRONIDAZOLE 500 MG PO TABS
500.0000 mg | ORAL_TABLET | Freq: Two times a day (BID) | ORAL | Status: DC
  Administered 2023-11-18 – 2023-11-20 (×5): 500 mg via ORAL
  Filled 2023-11-18: qty 8
  Filled 2023-11-18 (×4): qty 2
  Filled 2023-11-18 (×2): qty 1

## 2023-11-18 MED ORDER — CLONIDINE HCL 0.1 MG PO TABS
0.1000 mg | ORAL_TABLET | ORAL | Status: DC
  Administered 2023-11-20: 0.1 mg via ORAL
  Filled 2023-11-18 (×3): qty 1

## 2023-11-18 MED ORDER — CLONIDINE HCL 0.1 MG PO TABS
0.1000 mg | ORAL_TABLET | Freq: Four times a day (QID) | ORAL | Status: AC
  Administered 2023-11-18 (×3): 0.1 mg via ORAL
  Filled 2023-11-18 (×2): qty 1
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
## 2023-11-18 NOTE — ED Notes (Signed)
 Patient given robaxin, naproxen, and bentyl PO PRN for opiate withdrawal symptoms which appear to be mild at this time.  Will monitor.
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
## 2023-11-19 DIAGNOSIS — D573 Sickle-cell trait: Secondary | ICD-10-CM | POA: Diagnosis not present

## 2023-11-19 DIAGNOSIS — F1120 Opioid dependence, uncomplicated: Secondary | ICD-10-CM | POA: Diagnosis not present

## 2023-11-19 DIAGNOSIS — Z79899 Other long term (current) drug therapy: Secondary | ICD-10-CM | POA: Diagnosis not present

## 2023-11-19 DIAGNOSIS — F332 Major depressive disorder, recurrent severe without psychotic features: Secondary | ICD-10-CM | POA: Diagnosis not present

## 2023-11-19 MED ORDER — BUPRENORPHINE HCL-NALOXONE HCL 2-0.5 MG SL SUBL
2.0000 | SUBLINGUAL_TABLET | Freq: Once | SUBLINGUAL | Status: AC
  Administered 2023-11-19: 2 via SUBLINGUAL
  Filled 2023-11-19: qty 2

## 2023-11-19 MED ORDER — BUPRENORPHINE HCL-NALOXONE HCL 8-2 MG SL SUBL
1.0000 | SUBLINGUAL_TABLET | Freq: Every day | SUBLINGUAL | Status: DC
  Administered 2023-11-20: 1 via SUBLINGUAL
  Filled 2023-11-19: qty 1

## 2023-11-19 MED ORDER — ENSURE ENLIVE PO LIQD
237.0000 mL | Freq: Three times a day (TID) | ORAL | Status: DC
  Administered 2023-11-19: 237 mL via ORAL

## 2023-11-19 MED ORDER — BUPRENORPHINE HCL-NALOXONE HCL 2-0.5 MG SL SUBL: 2.0000 | SUBLINGUAL_TABLET | Freq: Once | SUBLINGUAL | Status: DC

## 2023-11-19 MED ORDER — HYDROXYZINE HCL 50 MG PO TABS
50.0000 mg | ORAL_TABLET | Freq: Four times a day (QID) | ORAL | Status: DC | PRN
  Administered 2023-11-19: 50 mg via ORAL
  Filled 2023-11-19: qty 20
  Filled 2023-11-19: qty 14
  Filled 2023-11-19: qty 2
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
## 2023-11-19 NOTE — ED Notes (Signed)
 Pt is in the dayroom watching TV with peers. Pt denies SI/HI/AVH. Pt has no further complain.No acute distress noted. Will continue to monitor for safety and provide support.

## 2023-11-19 NOTE — ED Notes (Signed)
 Patient is sleeping. Respirations equal and unlabored, skin warm and dry. No change in assessment or acuity. Routine safety checks conducted according to facility protocol. Will continue to monitor for safety.
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
## 2023-11-19 NOTE — ED Notes (Signed)
 Patient sitting in bedroom meeting with the provider. No acute distress noted. No concerns voiced. Informed patient to notify staff with any needs or assistance. Patient verbalized understanding or agreement. Safety checks in place per facility policy.
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
## 2023-11-19 NOTE — ED Notes (Signed)
 Patient resting with eyes closed in no apparent acute distress. Respirations even and unlabored. Environment secured. Safety checks in place according to facility policy.

## 2023-11-19 NOTE — ED Notes (Signed)
 Patient alert & oriented x4. Denies intent to harm self or others when asked. Denies A/VH. Patient reports generalized pain rating 10/10, PRN Tylenol given. Scheduled and PRN medications administered with no complications. No acute distress noted. Support and encouragement provided. Routine safety checks conducted per facility protocol. Encouraged patient to notify staff if any thoughts of harm towards self or others arise. Patient verbalizes understanding and agreement.
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
## 2023-11-19 NOTE — ED Provider Notes (Signed)
 Patient received Suboxone 4mg /2mg  and visibly improving withdrawal symptoms with patient walking outside of her room now. Patient notes residual cravings after 2 hours and would like an additional dose of 4mg /2mg . Plan to initiate 8mg /2mg  tomorrow. Patient is tolerating induction well. Denies SI, HI or AVH and was seen smiling on the unit.

## 2023-11-19 NOTE — ED Notes (Signed)
 Patient sitting in dayroom interacting with peers. No acute distress noted. No concerns voiced. Informed patient to notify staff with any needs or assistance. Patient verbalized understanding or agreement. Safety checks in place per facility policy.

## 2023-11-20 DIAGNOSIS — Z79899 Other long term (current) drug therapy: Secondary | ICD-10-CM | POA: Diagnosis not present

## 2023-11-20 DIAGNOSIS — F332 Major depressive disorder, recurrent severe without psychotic features: Secondary | ICD-10-CM | POA: Diagnosis not present

## 2023-11-20 DIAGNOSIS — F1120 Opioid dependence, uncomplicated: Secondary | ICD-10-CM | POA: Diagnosis not present

## 2023-11-20 DIAGNOSIS — D573 Sickle-cell trait: Secondary | ICD-10-CM | POA: Diagnosis not present

## 2023-11-20 MED ORDER — SERTRALINE HCL 25 MG PO TABS: 25.0000 mg | ORAL_TABLET | ORAL | 0 refills | Status: AC

## 2023-11-20 MED ORDER — TRAZODONE HCL 50 MG PO TABS
50.0000 mg | ORAL_TABLET | Freq: Every evening | ORAL | 0 refills | Status: AC | PRN
Start: 2023-11-20 — End: ?

## 2023-11-20 MED ORDER — SERTRALINE HCL 25 MG PO TABS: 25.0000 mg | ORAL_TABLET | ORAL | 0 refills | Status: DC

## 2023-11-20 MED ORDER — HYDROXYZINE HCL 50 MG PO TABS: 50.0000 mg | ORAL_TABLET | Freq: Four times a day (QID) | ORAL | 0 refills | Status: DC | PRN

## 2023-11-20 MED ORDER — METRONIDAZOLE 500 MG PO TABS: 500.0000 mg | ORAL_TABLET | Freq: Two times a day (BID) | ORAL | 0 refills | Status: AC

## 2023-11-20 MED ORDER — BUPRENORPHINE HCL-NALOXONE HCL 8-2 MG SL SUBL: 1.0000 | SUBLINGUAL_TABLET | Freq: Every day | SUBLINGUAL | 0 refills | Status: AC

## 2023-11-20 NOTE — Discharge Planning (Signed)
 Intake Appointment with Kelly Hensley at [facility] has been scheduled for 11/21/2023 at 8:30am for MAT. Patient aware of plan. No other needs to report at this time. LCSW to sign off.   Please inform if further LCSW needs arise prior to discharge.   Fernande Boyden, LCSW Clinical Social Worker [facility] Ph: [phone]
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
## 2023-11-20 NOTE — Discharge Planning (Signed)
 LCSW was stopped by patient on today stating she is discharging and will need a bus ticket. Patient aware that bus ticket can be provided at discharge by RN staff. Patient aware that LCSW has attempted to make contact with [facility] in [facility] for Suboxone follow up, however received no answer. Awaiting update. LCSW reached out to [facility] and [facility] was needed to arrange appt. Documents sent for follow up to be arranged. Appointment information will be provided to the patient once received. No other needs were reported by patient at this time.   Fernande Boyden, LCSW Clinical Social Worker [place] [facility] Ph: [phone]

## 2023-11-20 NOTE — ED Notes (Signed)
 Patient is sleeping. Respirations equal and unlabored, skin warm and dry. No change in assessment or acuity. Routine safety checks conducted according to facility protocol. Will continue to monitor for safety.

## 2024-02-05 ENCOUNTER — Emergency Department (HOSPITAL_COMMUNITY)
Admission: EM | Admit: 2024-02-05 | Discharge: 2024-02-06 | Disposition: A | Discharge status: Other Acute Inpatient Hospital | Attending: Emergency Medicine | Admitting: Emergency Medicine

## 2024-02-05 ENCOUNTER — Other Ambulatory Visit: Payer: Self-pay

## 2024-02-05 DIAGNOSIS — F1990 Other psychoactive substance use, unspecified, uncomplicated: Secondary | ICD-10-CM

## 2024-02-05 DIAGNOSIS — F1120 Opioid dependence, uncomplicated: Secondary | ICD-10-CM | POA: Insufficient documentation

## 2024-02-05 DIAGNOSIS — F1910 Other psychoactive substance abuse, uncomplicated: Secondary | ICD-10-CM | POA: Diagnosis not present

## 2024-02-05 DIAGNOSIS — F329 Major depressive disorder, single episode, unspecified: Secondary | ICD-10-CM | POA: Insufficient documentation

## 2024-02-05 DIAGNOSIS — M791 Myalgia, unspecified site: Secondary | ICD-10-CM | POA: Insufficient documentation

## 2024-02-05 DIAGNOSIS — R109 Unspecified abdominal pain: Secondary | ICD-10-CM | POA: Insufficient documentation

## 2024-02-05 DIAGNOSIS — R45851 Suicidal ideations: Secondary | ICD-10-CM | POA: Diagnosis present

## 2024-02-05 DIAGNOSIS — F32A Depression, unspecified: Secondary | ICD-10-CM | POA: Diagnosis present

## 2024-02-05 LAB — COMPREHENSIVE METABOLIC PANEL WITH GFR
ALT: 24 U/L (ref 0–44)
AST: 20 U/L (ref 15–41)
Albumin: 4.2 g/dL (ref 3.5–5.0)
Alkaline Phosphatase: 54 U/L (ref 38–126)
Anion gap: 9 (ref 5–15)
BUN: 16 mg/dL (ref 6–20)
CO2: 23 mmol/L (ref 22–32)
Calcium: 9.7 mg/dL (ref 8.9–10.3)
Chloride: 106 mmol/L (ref 98–111)
Creatinine, Ser: 0.74 mg/dL (ref 0.44–1.00)
GFR, Estimated: >60 mL/min (ref >60)
Glucose, Bld: 93 mg/dL (ref 70–99)
Potassium: 4.4 mmol/L (ref 3.5–5.1)
Sodium: 138 mmol/L (ref 135–145)
Total Bilirubin: 0.6 mg/dL (ref 0.0–1.2)
Total Protein: 8 g/dL (ref 6.5–8.1)

## 2024-02-05 LAB — CBC WITH DIFFERENTIAL/PLATELET
Abs Immature Granulocytes: 0.01 10*3/uL (ref 0.00–0.07)
Basophils Absolute: 0 10*3/uL (ref 0.0–0.1)
Basophils Relative: 1 %
Eosinophils Absolute: 0.1 10*3/uL (ref 0.0–0.5)
Eosinophils Relative: 1 %
HCT: 34.5 % — ABNORMAL LOW (ref 36.0–46.0)
Hemoglobin: 12 g/dL (ref 12.0–15.0)
Immature Granulocytes: 0 %
Lymphocytes Relative: 26 %
Lymphs Abs: 1.7 10*3/uL (ref 0.7–4.0)
MCH: 25.6 pg — ABNORMAL LOW (ref 26.0–34.0)
MCHC: 34.8 g/dL (ref 30.0–36.0)
MCV: 73.6 fL — ABNORMAL LOW (ref 80.0–100.0)
Monocytes Absolute: 0.7 10*3/uL (ref 0.1–1.0)
Monocytes Relative: 10 %
Neutro Abs: 4.2 10*3/uL (ref 1.7–7.7)
Neutrophils Relative %: 62 %
Platelets: 304 10*3/uL (ref 150–400)
RBC: 4.69 MIL/uL (ref 3.87–5.11)
RDW: 13.8 % (ref 11.5–15.5)
WBC: 6.6 10*3/uL (ref 4.0–10.5)
nRBC: 0 % (ref 0.0–0.2)

## 2024-02-05 LAB — RAPID URINE DRUG SCREEN, HOSP PERFORMED
Amphetamines: POSITIVE — AB
Barbiturates: NOT DETECTED
Benzodiazepines: NOT DETECTED
Cocaine: POSITIVE — AB
Opiates: NOT DETECTED
Tetrahydrocannabinol: NOT DETECTED

## 2024-02-05 LAB — ETHANOL: Alcohol, Ethyl (B): <15 mg/dL (ref <15)

## 2024-02-05 NOTE — ED Notes (Signed)
 Pt has two belongings bags that are labeled and placed in the pt belongings cabinet in triage. In bag one the pt has a pair of brown boots, shorts, and jacket. In bag 2 the pt has miscellaneous items that include a blue rainbows bag containing a couple bags of chips, a wallet, phone and jewelry.

## 2024-02-05 NOTE — ED Notes (Signed)
 Patient also complained to another staff member while her labs were being collected that he has a concern for STI.
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
## 2024-02-06 ENCOUNTER — Other Ambulatory Visit (HOSPITAL_COMMUNITY)
Admission: EM | Admit: 2024-02-06 | Discharge: 2024-02-07 | Disposition: A | Discharge status: Home / Self Care | Source: Home / Self Care | Admitting: Psychiatry

## 2024-02-06 DIAGNOSIS — F32A Depression, unspecified: Secondary | ICD-10-CM | POA: Insufficient documentation

## 2024-02-06 DIAGNOSIS — R45851 Suicidal ideations: Secondary | ICD-10-CM | POA: Insufficient documentation

## 2024-02-06 DIAGNOSIS — F1910 Other psychoactive substance abuse, uncomplicated: Secondary | ICD-10-CM | POA: Insufficient documentation

## 2024-02-06 LAB — URINALYSIS, W/ REFLEX TO CULTURE (INFECTION SUSPECTED)
Bacteria, UA: NONE SEEN
Bilirubin Urine: NEGATIVE
Glucose, UA: NEGATIVE mg/dL
Hgb urine dipstick: NEGATIVE
Ketones, ur: NEGATIVE mg/dL
Leukocytes,Ua: NEGATIVE
Nitrite: NEGATIVE
Protein, ur: 30 mg/dL — AB
Specific Gravity, Urine: 1.035 — ABNORMAL HIGH (ref 1.005–1.030)
WBC, UA: >50 WBC/hpf (ref 0–5)
pH: 5 (ref 5.0–8.0)

## 2024-02-06 LAB — WET PREP, GENITAL
Sperm: NONE SEEN
Trich, Wet Prep: NONE SEEN
WBC, Wet Prep HPF POC: <10 (ref <10)
Yeast Wet Prep HPF POC: NONE SEEN

## 2024-02-06 LAB — SALICYLATE LEVEL: Salicylate Lvl: <7 mg/dL — ABNORMAL LOW (ref 7.0–30.0)

## 2024-02-06 LAB — HCG, SERUM, QUALITATIVE: Preg, Serum: NEGATIVE

## 2024-02-06 LAB — ACETAMINOPHEN LEVEL: Acetaminophen (Tylenol), Serum: <10 ug/mL — ABNORMAL LOW (ref 10–30)

## 2024-02-06 MED ORDER — SERTRALINE HCL 50 MG PO TABS: 25.0000 mg | ORAL_TABLET | Freq: Every day | ORAL | Status: DC

## 2024-02-06 MED ORDER — ONDANSETRON 4 MG PO TBDP: 4.0000 mg | ORAL_TABLET | Freq: Four times a day (QID) | ORAL | Status: DC | PRN

## 2024-02-06 MED ORDER — ACETAMINOPHEN 325 MG PO TABS
650.0000 mg | ORAL_TABLET | Freq: Four times a day (QID) | ORAL | Status: DC | PRN
  Administered 2024-02-06: 650 mg via ORAL
  Filled 2024-02-06: qty 2

## 2024-02-06 MED ORDER — LORAZEPAM 2 MG/ML IJ SOLN: 2.0000 mg | Freq: Three times a day (TID) | INTRAMUSCULAR | Status: DC | PRN

## 2024-02-06 MED ORDER — CLONIDINE HCL 0.1 MG PO TABS
0.1000 mg | ORAL_TABLET | Freq: Four times a day (QID) | ORAL | Status: DC
  Administered 2024-02-06 – 2024-02-07 (×5): 0.1 mg via ORAL
  Filled 2024-02-06 (×5): qty 1

## 2024-02-06 MED ORDER — NAPROXEN 500 MG PO TABS
500.0000 mg | ORAL_TABLET | Freq: Two times a day (BID) | ORAL | Status: DC | PRN
  Administered 2024-02-06: 500 mg via ORAL
  Filled 2024-02-06: qty 1

## 2024-02-06 MED ORDER — METRONIDAZOLE 500 MG PO TABS
500.0000 mg | ORAL_TABLET | Freq: Two times a day (BID) | ORAL | 0 refills | Status: DC
Start: 2024-02-06 — End: 2024-02-07

## 2024-02-06 MED ORDER — DIPHENHYDRAMINE HCL 50 MG PO CAPS: 50.0000 mg | ORAL_CAPSULE | Freq: Three times a day (TID) | ORAL | Status: DC | PRN

## 2024-02-06 MED ORDER — METHOCARBAMOL 500 MG PO TABS
500.0000 mg | ORAL_TABLET | Freq: Three times a day (TID) | ORAL | Status: DC | PRN
  Administered 2024-02-06: 500 mg via ORAL
  Filled 2024-02-06: qty 1

## 2024-02-06 MED ORDER — SERTRALINE HCL 25 MG PO TABS
25.0000 mg | ORAL_TABLET | Freq: Every day | ORAL | Status: DC
  Administered 2024-02-07: 25 mg via ORAL
  Filled 2024-02-06: qty 1

## 2024-02-06 MED ORDER — HYDROXYZINE HCL 25 MG PO TABS
25.0000 mg | ORAL_TABLET | Freq: Three times a day (TID) | ORAL | Status: DC | PRN
  Administered 2024-02-06: 25 mg via ORAL
  Filled 2024-02-06: qty 1

## 2024-02-06 MED ORDER — DIPHENHYDRAMINE HCL 50 MG/ML IJ SOLN: 50.0000 mg | Freq: Three times a day (TID) | INTRAMUSCULAR | Status: DC | PRN

## 2024-02-06 MED ORDER — ONDANSETRON HCL 4 MG PO TABS: 4.0000 mg | ORAL_TABLET | Freq: Three times a day (TID) | ORAL | Status: DC | PRN

## 2024-02-06 MED ORDER — MAGNESIUM HYDROXIDE 400 MG/5ML PO SUSP: 30.0000 mL | Freq: Every day | ORAL | Status: DC | PRN

## 2024-02-06 MED ORDER — TRAZODONE HCL 50 MG PO TABS
50.0000 mg | ORAL_TABLET | Freq: Every evening | ORAL | Status: DC | PRN
  Administered 2024-02-06: 50 mg via ORAL
  Filled 2024-02-06: qty 1

## 2024-02-06 MED ORDER — TRAZODONE HCL 50 MG PO TABS: 50.0000 mg | ORAL_TABLET | Freq: Every evening | ORAL | Status: DC | PRN

## 2024-02-06 MED ORDER — LOPERAMIDE HCL 2 MG PO CAPS: 2.0000 mg | ORAL_CAPSULE | ORAL | Status: DC | PRN

## 2024-02-06 MED ORDER — DICYCLOMINE HCL 10 MG PO CAPS
10.0000 mg | ORAL_CAPSULE | Freq: Once | ORAL | Status: AC
  Administered 2024-02-06: 10 mg via ORAL
  Filled 2024-02-06: qty 1

## 2024-02-06 MED ORDER — HYDROXYZINE HCL 25 MG PO TABS
25.0000 mg | ORAL_TABLET | Freq: Three times a day (TID) | ORAL | Status: DC | PRN
Start: 2024-02-06 — End: 2024-02-06

## 2024-02-06 MED ORDER — CLONIDINE HCL 0.1 MG PO TABS
0.1000 mg | ORAL_TABLET | Freq: Every day | ORAL | Status: DC
Start: 2024-02-10 — End: 2024-02-07

## 2024-02-06 MED ORDER — ALUM & MAG HYDROXIDE-SIMETH 200-200-20 MG/5ML PO SUSP: 30.0000 mL | ORAL | Status: DC | PRN

## 2024-02-06 MED ORDER — METRONIDAZOLE 250 MG PO TABS
500.0000 mg | ORAL_TABLET | Freq: Two times a day (BID) | ORAL | Status: DC
  Administered 2024-02-06 – 2024-02-07 (×3): 500 mg via ORAL
  Filled 2024-02-06 (×3): qty 2

## 2024-02-06 MED ORDER — HALOPERIDOL LACTATE 5 MG/ML IJ SOLN: 10.0000 mg | Freq: Three times a day (TID) | INTRAMUSCULAR | Status: DC | PRN

## 2024-02-06 MED ORDER — CLONIDINE HCL 0.1 MG PO TABS: 0.1000 mg | ORAL_TABLET | ORAL | Status: DC

## 2024-02-06 MED ORDER — HALOPERIDOL LACTATE 5 MG/ML IJ SOLN: 5.0000 mg | Freq: Three times a day (TID) | INTRAMUSCULAR | Status: DC | PRN

## 2024-02-06 MED ORDER — HALOPERIDOL 5 MG PO TABS: 5.0000 mg | ORAL_TABLET | Freq: Three times a day (TID) | ORAL | Status: DC | PRN

## 2024-02-06 MED ORDER — ACETAMINOPHEN 325 MG PO TABS: 650.0000 mg | ORAL_TABLET | ORAL | Status: DC | PRN

## 2024-02-06 MED ORDER — DICYCLOMINE HCL 20 MG PO TABS
20.0000 mg | ORAL_TABLET | Freq: Four times a day (QID) | ORAL | Status: DC | PRN
  Administered 2024-02-06: 20 mg via ORAL
  Filled 2024-02-06: qty 1

## 2024-02-06 NOTE — Progress Notes (Signed)
 02/06/2024  1037 Called [facility] inform AnnMarie that patient wet prep was positive for BV and MD wants her to be started on Flagyl  500mg  BID x 7days.
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
## 2024-02-06 NOTE — ED Notes (Addendum)
 Patient reports withdrawal symptoms of body aches, chills and somnolence. Pt is in her room resting in bed. Pt denies SI/HI/AVH. No acute distress noted. Will continue to monitor for safety

## 2024-02-06 NOTE — Progress Notes (Signed)
 02/06/2024  1478  Report given to Retha Cast at [facility].
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
## 2024-02-06 NOTE — ED Provider Notes (Addendum)
 Facility Based Crisis Admission H&P  Date: 02/06/24 Patient Name: Kelly Hensley MRN: [id number] Chief Complaint: "It's just life"  Diagnoses:  Final diagnoses:  None    HPI: The patient is a 28yo female, with past psychiatric history of substance induced mood disorder, severe opioid use disorder, tobacco use, PTSD   and major depressive disorder, who presented to the emergency department with concern for worsening withdrawal symptoms as she was trying to abstain from  Fentanyl  use.  Patient shares she is currently going through fentanyl  withdrawal and still struggling with the loss of her children's father who died 2 years ago. Also is very depressed because she is currently homeless, unemployed, with no support. She reports she was using  fentanyl  daily, approximately 50mg . Last used about 24 hours ago. She has history of detox/ rehabilitation programs but none recently. Reports she is not looking into going back to rehab because she is currently talking to someone with a potential strong relationship. This person encouraged her to seek help with her substance use problems. Denies legal history attributed to drug use.   Patient endorses daily anxiety and depression related to stressors and recent relapse. She reports that past trauma has a lot to do with her current situation: reports she continues to think about the death of her child's father who died in front of her through a physical altercation.  She reports trouble of sleeping, decreased appetite, hopelessness, worthlessness, guilt/shame. Has been experiencing stomach upset with diarrhea and nausea. One prior suicide attempt.   Patient reports her family is not supportive. Her mother is around but not in her life. Father not in the picture but is known with hx of a mental illness and substance use. Patient reports having financial difficulties and she has started begging on the street. Patient is currently homeless.   Patient has started  Clonidine  Detox protocol. Sertraline   25 mg PO will be increased to 50 for her depressive symptoms.  Social worker notified to follow up on patient's outpatient services as well as housing arrangements.   PHQ 2-9:  Flowsheet Row ED from 02/06/2024 in [facility] Office Visit from 01/10/2023 in [facility] Office Visit from 09/27/2018 in [facility]  Thoughts that you would be better off dead, or of hurting yourself in some way Not at all Several days Not at all  PHQ-9 Total Score 12 10 5        Flowsheet Row ED from 02/06/2024 in [facility] ED from 02/05/2024 in [facility] Emergency Department at [facility] ED from 11/16/2023 in [facility]  C-SSRS RISK CATEGORY Low Risk Low Risk Low Risk         Total Time spent with patient: 1 hour  Musculoskeletal  Strength & Muscle Tone: within normal limits Gait & Station: normal Patient leans: N/A  Psychiatric Specialty Exam  Presentation General Appearance:  Casual  Eye Contact: Fair  Speech: Clear and Coherent  Speech Volume: Normal  Handedness: Right   Mood and Affect  Mood: Anxious; Depressed; Irritable  Affect: Congruent   Thought Process  Thought Processes: Coherent  Descriptions of Associations:Intact  Orientation:Full (Time, Place and Person)  Thought Content:WDL  Diagnosis of Schizophrenia or Schizoaffective disorder in past: No   Hallucinations:Hallucinations: None  Ideas of Reference:None  Suicidal Thoughts:Suicidal Thoughts: No  Homicidal Thoughts:Homicidal Thoughts: No   Sensorium  Memory: Immediate Fair; Recent Fair; Remote Fair  Judgment: Fair  Insight: Fair   Chartered certified accountant: Fair  Attention Span: Fair  Recall: [employer] of Knowledge: Fair  Language: Fair   Psychomotor Activity  Psychomotor Activity: Psychomotor  Activity: Normal   Assets  Assets: Manufacturing systems engineer; Desire for Improvement; Physical Health   Sleep  Sleep: Sleep: Poor Number of Hours of Sleep: 4   Nutritional Assessment (For OBS and FBC admissions only) Has the patient had a weight loss or gain of 10 pounds or more in the last 3 months?: No Has the patient had a decrease in food intake/or appetite?: Yes Does the patient have dental problems?: No Does the patient have eating habits or behaviors that may be indicators of an eating disorder including binging or inducing vomiting?: No Has the patient recently lost weight without trying?: 0 Has the patient been eating poorly because of a decreased appetite?: 1 Malnutrition Screening Tool Score: 1    Physical Exam ROS  Blood pressure 124/76, pulse 72, temperature 98.9 F (37.2 C), temperature source Oral, resp. rate 18, SpO2 95%. There is no height or weight on file to calculate BMI.  Past Psychiatric History: PTSD, Substance use   Is the patient at risk to self? No  Has the patient been a risk to self in the past 6 months? Yes .    Has the patient been a risk to self within the distant past? Yes   Is the patient a risk to others? No   Has the patient been a risk to others in the past 6 months? No   Has the patient been a risk to others within the distant past? No   Past Medical History: NA Family History: NA Social History: NA  Last Labs:  Admission on 02/05/2024, Discharged on 02/06/2024  Component Date Value Ref Range Status   Sodium 02/05/2024 138  135 - 145 mmol/L Final   Potassium 02/05/2024 4.4  3.5 - 5.1 mmol/L Final   Chloride 02/05/2024 106  98 - 111 mmol/L Final   CO2 02/05/2024 23  22 - 32 mmol/L Final   Glucose, Bld 02/05/2024 93  70 - 99 mg/dL Final   Glucose reference range applies only to samples taken after fasting for at least 8 hours.   BUN 02/05/2024 16  6 - 20 mg/dL Final   Creatinine, Ser 02/05/2024 0.74  0.44 - 1.00 mg/dL Final    Calcium 16/06/9603 9.7  8.9 - 10.3 mg/dL Final   Total Protein 54/05/8118 8.0  6.5 - 8.1 g/dL Final   Albumin 14/78/2956 4.2  3.5 - 5.0 g/dL Final   AST 21/30/8657 20  15 - 41 U/L Final   ALT 02/05/2024 24  0 - 44 U/L Final   Alkaline Phosphatase 02/05/2024 54  38 - 126 U/L Final   Total Bilirubin 02/05/2024 0.6  0.0 - 1.2 mg/dL Final   GFR, Estimated 02/05/2024 >60  >60 mL/min Final   Comment: (NOTE) Calculated using the CKD-EPI Creatinine Equation (2021)    Anion gap 02/05/2024 9  5 - 15 Final   Performed at [facility], [address] [place], [place] [address]   Alcohol, Ethyl (B) 02/05/2024 <15  <15 mg/dL Final   Comment: Please note change in reference range. (NOTE) For medical purposes only. Performed at [facility], [address] [place], [place] [address]    Opiates 02/05/2024 NONE DETECTED  NONE DETECTED Final   Cocaine 02/05/2024 POSITIVE (A)  NONE DETECTED Final   Benzodiazepines 02/05/2024 NONE  DETECTED  NONE DETECTED Final   Amphetamines 02/05/2024 POSITIVE (A)  NONE DETECTED Final   Comment: (NOTE) Trazodone  is metabolized in vivo to several metabolites, including pharmacologically active m-CPP, which is excreted in the urine. Immunoassay screens for amphetamines and MDMA have potential cross-reactivity with these compounds and may provide false positive  results.     Tetrahydrocannabinol 02/05/2024 NONE DETECTED  NONE DETECTED Final   Barbiturates 02/05/2024 NONE DETECTED  NONE DETECTED Final   Comment: (NOTE) DRUG SCREEN FOR MEDICAL PURPOSES ONLY.  IF CONFIRMATION IS NEEDED FOR ANY PURPOSE, NOTIFY LAB WITHIN 5 DAYS.  LOWEST DETECTABLE LIMITS FOR URINE DRUG SCREEN Drug Class                     Cutoff (ng/mL) Amphetamine and metabolites    1000 Barbiturate and metabolites    200 Benzodiazepine                 200 Opiates and metabolites        300 Cocaine and metabolites        300 THC                             50 Performed at [facility], [address], [place], [place] [address]    WBC 02/05/2024 6.6  4.0 - 10.5 K/uL Final   RBC 02/05/2024 4.69  3.87 - 5.11 MIL/uL Final   Hemoglobin 02/05/2024 12.0  12.0 - 15.0 g/dL Final   HCT 60/45/4098 34.5 (L)  36.0 - 46.0 % Final   MCV 02/05/2024 73.6 (L)  80.0 - 100.0 fL Final   MCH 02/05/2024 25.6 (L)  26.0 - 34.0 pg Final   MCHC 02/05/2024 34.8  30.0 - 36.0 g/dL Final   RDW 11/91/4782 13.8  11.5 - 15.5 % Final   Platelets 02/05/2024 304  150 - 400 K/uL Final   nRBC 02/05/2024 0.0  0.0 - 0.2 % Final   Neutrophils Relative % 02/05/2024 62  % Final   Neutro Abs 02/05/2024 4.2  1.7 - 7.7 K/uL Final   Lymphocytes Relative 02/05/2024 26  % Final   Lymphs Abs 02/05/2024 1.7  0.7 - 4.0 K/uL Final   Monocytes Relative 02/05/2024 10  % Final   Monocytes Absolute 02/05/2024 0.7  0.1 - 1.0 K/uL Final   Eosinophils Relative 02/05/2024 1  % Final   Eosinophils Absolute 02/05/2024 0.1  0.0 - 0.5 K/uL Final   Basophils Relative 02/05/2024 1  % Final   Basophils Absolute 02/05/2024 0.0  0.0 - 0.1 K/uL Final   Immature Granulocytes 02/05/2024 0  % Final   Abs Immature Granulocytes 02/05/2024 0.01  0.00 - 0.07 K/uL Final   Performed at [facility], [address], [place], [place] [address]   Preg, Serum 02/05/2024 NEGATIVE  NEGATIVE Final   Comment:        THE SENSITIVITY OF THIS METHODOLOGY IS >10 mIU/mL. Performed at [facility], [address], [place], [place] [address]    Specimen Source 02/05/2024 URINE, CLEAN CATCH   Final   Color, Urine 02/05/2024 YELLOW  YELLOW Final   APPearance 02/05/2024 TURBID (A)  CLEAR Final   Specific Gravity, Urine 02/05/2024 1.035 (H)  1.005 - 1.030 Final   pH 02/05/2024 5.0  5.0 - 8.0 Final   Glucose, UA 02/05/2024 NEGATIVE  NEGATIVE mg/dL Final   Hgb urine dipstick 02/05/2024 NEGATIVE  NEGATIVE Final  Bilirubin Urine 02/05/2024 NEGATIVE  NEGATIVE Final    Ketones, ur 02/05/2024 NEGATIVE  NEGATIVE mg/dL Final   Protein, ur 60/45/4098 30 (A)  NEGATIVE mg/dL Final   Nitrite 11/91/4782 NEGATIVE  NEGATIVE Final   Leukocytes,Ua 02/05/2024 NEGATIVE  NEGATIVE Final   RBC / HPF 02/05/2024 0-5  0 - 5 RBC/hpf Final   WBC, UA 02/05/2024 >50  0 - 5 WBC/hpf Final   Comment:        Reflex urine culture not performed if WBC <=10, OR if Squamous epithelial cells >5. If Squamous epithelial cells >5 suggest recollection.    Bacteria, UA 02/05/2024 NONE SEEN  NONE SEEN Final   Squamous Epithelial / HPF 02/05/2024 0-5  0 - 5 /HPF Final   Mucus 02/05/2024 PRESENT   Final   Amorphous Crystal 02/05/2024 PRESENT   Final   Performed at [facility], [address] [place], [place] [address]   Salicylate Lvl 02/05/2024 <7.0 (L)  7.0 - 30.0 mg/dL Final   Performed at [facility], [address] [place], [place] [address]   Acetaminophen  (Tylenol ), Serum 02/05/2024 <10 (L)  10 - 30 ug/mL Final   Comment: (NOTE) Therapeutic concentrations vary significantly. A range of 10-30 ug/mL  may be an effective concentration for many patients. However, some  are best treated at concentrations outside of this range. Acetaminophen  concentrations >150 ug/mL at 4 hours after ingestion  and >50 ug/mL at 12 hours after ingestion are often associated with  toxic reactions.  Performed at [facility], [address] [place], [place] [address]    Yeast Wet Prep HPF POC 02/06/2024 NONE SEEN  NONE SEEN Final   Swab received with less than 0.5 mL of saline, saline added to specimen, interpret results with caution.   Trich, Wet Prep 02/06/2024 NONE SEEN  NONE SEEN Final   Clue Cells Wet Prep HPF POC 02/06/2024 PRESENT (A)  NONE SEEN Final   WBC, Wet Prep HPF POC 02/06/2024 <10  <10 Final   Sperm 02/06/2024 NONE SEEN   Final   Performed at [facility], [address] [place], [place] [address]   Admission on 11/16/2023, Discharged on 11/20/2023  Component Date Value Ref Range Status   Vitamin B-12 11/18/2023 275  180 - 914 pg/mL Final   Comment: (NOTE) This assay is not validated for testing neonatal or myeloproliferative syndrome specimens for Vitamin B12 levels. Performed at [facility], [address] [place], [place] [address]    Vit D, 25-Hydroxy 11/18/2023 35.03  30 - 100 ng/mL Final   Comment: (NOTE) Vitamin D  deficiency has been defined by the Institute of Medicine  and an Endocrine Society practice guideline as a level of serum 25-OH  vitamin D  less than 20 ng/mL (1,2). The Endocrine Society went on to  further define vitamin D  insufficiency as a level between 21 and 29  ng/mL (2).  1. IOM (Institute of Medicine). 2010. Dietary reference intakes for  calcium and D. [place]: [employer]. 2. Holick MF, Binkley , Bischoff-Ferrari HA, et al. Evaluation,  treatment, and prevention of vitamin D  deficiency: an Endocrine  Society clinical practice guideline, JCEM. 2011 Jul; 96(7): 1911-30.  Performed at [facility], [address], [place], [place] [address]   Admission on 11/15/2023, Discharged on 11/16/2023  Component Date Value Ref Range Status   Sodium 11/15/2023 137  135 - 145 mmol/L Final   Potassium 11/15/2023 3.6  3.5 - 5.1 mmol/L Final   Chloride 11/15/2023 103  98 - 111 mmol/L Final   CO2 11/15/2023 25  22 - 32 mmol/L Final   Glucose, Bld 11/15/2023 67 (L)  70 - 99 mg/dL Final   Glucose reference range applies only to samples taken after fasting for at least 8 hours.   BUN 11/15/2023 11  6 - 20 mg/dL Final   Creatinine, Ser 11/15/2023 0.84  0.44 - 1.00 mg/dL Final   Calcium 41/32/4401 8.6 (L)  8.9 - 10.3 mg/dL Final   Total Protein 02/72/5366 7.2  6.5 - 8.1 g/dL Final   Albumin 44/11/4740 3.9  3.5 - 5.0 g/dL Final   AST 59/56/3875 13 (L)  15 - 41 U/L Final   ALT 11/15/2023 11  0 - 44 U/L Final   Alkaline  Phosphatase 11/15/2023 48  38 - 126 U/L Final   Total Bilirubin 11/15/2023 0.2  0.0 - 1.2 mg/dL Final   GFR, Estimated 11/15/2023 >60  >60 mL/min Final   Comment: (NOTE) Calculated using the CKD-EPI Creatinine Equation (2021)    Anion gap 11/15/2023 9  5 - 15 Final   Performed at [facility], [address] [place], [place] [address]   Alcohol, Ethyl (B) 11/15/2023 <10  <10 mg/dL Final   Comment: (NOTE) Lowest detectable limit for serum alcohol is 10 mg/dL.  For medical purposes only. Performed at [facility], [address] [place], [place] [address]    Salicylate Lvl 11/15/2023 <7.0 (L)  7.0 - 30.0 mg/dL Final   Performed at [facility], [address] [place], [place] [address]   Acetaminophen  (Tylenol ), Serum 11/15/2023 <10 (L)  10 - 30 ug/mL Final   Comment: (NOTE) Therapeutic concentrations vary significantly. A range of 10-30 ug/mL  may be an effective concentration for many patients. However, some  are best treated at concentrations outside of this range. Acetaminophen  concentrations >150 ug/mL at 4 hours after ingestion  and >50 ug/mL at 12 hours after ingestion are often associated with  toxic reactions.  Performed at [facility], [address] [place], [place] [address]    WBC 11/15/2023 4.1  4.0 - 10.5 K/uL Final   RBC 11/15/2023 4.86  3.87 - 5.11 MIL/uL Final   Hemoglobin 11/15/2023 12.5  12.0 - 15.0 g/dL Final   HCT 09/26/3233 36.2  36.0 - 46.0 % Final   MCV 11/15/2023 74.5 (L)  80.0 - 100.0 fL Final   MCH 11/15/2023 25.7 (L)  26.0 - 34.0 pg Final   MCHC 11/15/2023 34.5  30.0 - 36.0 g/dL Final   RDW 57/32/2025 14.0  11.5 - 15.5 % Final   Platelets 11/15/2023 276  150 - 400 K/uL Final   nRBC 11/15/2023 0.0  0.0 - 0.2 % Final   Performed at [facility], [address], [place], [place] [address]   Opiates 11/15/2023 POSITIVE (A)  NONE DETECTED Final   Cocaine  11/15/2023 NONE DETECTED  NONE DETECTED Final   Benzodiazepines 11/15/2023 NONE DETECTED  NONE DETECTED Final   Amphetamines 11/15/2023 NONE DETECTED  NONE DETECTED Final   Tetrahydrocannabinol 11/15/2023 NONE DETECTED  NONE DETECTED Final   Barbiturates 11/15/2023 NONE DETECTED  NONE DETECTED Final   Comment: (NOTE) DRUG SCREEN FOR MEDICAL PURPOSES ONLY.  IF CONFIRMATION IS NEEDED FOR ANY PURPOSE, NOTIFY LAB WITHIN 5 DAYS.  LOWEST DETECTABLE LIMITS FOR URINE DRUG SCREEN Drug Class  Cutoff (ng/mL) Amphetamine and metabolites    1000 Barbiturate and metabolites    200 Benzodiazepine                 200 Opiates and metabolites        300 Cocaine and metabolites        300 THC                            50 Performed at [facility], [address] [place], [place] [address]    Preg, Serum 11/15/2023 NEGATIVE  NEGATIVE Final   Comment:        THE SENSITIVITY OF THIS METHODOLOGY IS >10 mIU/mL. Performed at [facility], [address], [place], [place] [address]    HIV-1 P24 Antigen - HIV24 11/15/2023 NON REACTIVE  NON REACTIVE Final   Comment: (NOTE) Detection of p24 may be inhibited by biotin in the sample, causing false negative results in acute infection.    HIV 1/2 Antibodies 11/15/2023 NON REACTIVE  NON REACTIVE Final   Interpretation (HIV Ag Ab) 11/15/2023 A non reactive test result means that HIV 1 or HIV 2 antibodies and HIV 1 p24 antigen were not detected in the specimen.   Final   Performed at [facility], [address], [place], [place] [address]   RPR Ser Ql 11/16/2023 NON REACTIVE  NON REACTIVE Final   Performed at [facility], [address] [place], [place] [address]   Yeast Wet Prep HPF POC 11/16/2023 NONE SEEN  NONE SEEN Final   Trich, Wet Prep 11/16/2023 NONE SEEN  NONE SEEN Final   Clue Cells Wet Prep HPF POC 11/16/2023 PRESENT (A)  NONE SEEN Final   WBC, Wet Prep HPF POC  11/16/2023 <10  <10 Final   Sperm 11/16/2023 PRESENT   Final   Performed at [facility], [address], [place], [place] [address]   Neisseria Gonorrhea 11/15/2023 Negative   Final   Chlamydia 11/15/2023 Negative   Final   Comment 11/15/2023 Normal Reference Ranger Chlamydia - Negative   Final   Comment 11/15/2023 Normal Reference Range Neisseria Gonorrhea - Negative   Final    Allergies: Patient has no known allergies.  Medications:  Facility Ordered Medications  Medication   [COMPLETED] dicyclomine  (BENTYL ) capsule 10 mg   [START ON 02/07/2024] sertraline  (ZOLOFT ) tablet 25 mg   acetaminophen  (TYLENOL ) tablet 650 mg   alum & mag hydroxide-simeth (MAALOX/MYLANTA) 200-200-20 MG/5ML suspension 30 mL   magnesium  hydroxide (MILK OF MAGNESIA) suspension 30 mL   haloperidol  (HALDOL ) tablet 5 mg   And   diphenhydrAMINE  (BENADRYL ) capsule 50 mg   haloperidol  lactate (HALDOL ) injection 5 mg   And   diphenhydrAMINE  (BENADRYL ) injection 50 mg   And   LORazepam  (ATIVAN ) injection 2 mg   haloperidol  lactate (HALDOL ) injection 10 mg   And   diphenhydrAMINE  (BENADRYL ) injection 50 mg   And   LORazepam  (ATIVAN ) injection 2 mg   hydrOXYzine  (ATARAX ) tablet 25 mg   traZODone  (DESYREL ) tablet 50 mg   metroNIDAZOLE  (FLAGYL ) tablet 500 mg   dicyclomine  (BENTYL ) tablet 20 mg   loperamide  (IMODIUM ) capsule 2-4 mg   methocarbamol  (ROBAXIN ) tablet 500 mg   naproxen  (NAPROSYN ) tablet 500 mg   ondansetron  (ZOFRAN -ODT) disintegrating tablet 4 mg   cloNIDine  (CATAPRES ) tablet 0.1 mg   Followed by   Cecily Cohen ON 02/08/2024] cloNIDine  (CATAPRES ) tablet 0.1  mg   Followed by   Cecily Cohen ON 02/10/2024] cloNIDine  (CATAPRES ) tablet 0.1 mg   PTA Medications  Medication Sig   hydrOXYzine  (ATARAX ) 50 MG tablet Take 1 tablet (50 mg total) by mouth every 6 (six) hours as needed for anxiety. (Patient not taking: Reported on 02/06/2024)   traZODone  (DESYREL ) 50 MG tablet Take 1 tablet (50 mg total)  by mouth at bedtime as needed for sleep. Take 1-2 tablets (50-100 mg) by mouth at bedtime as needed (Patient not taking: Reported on 02/06/2024)   sertraline  (ZOLOFT ) 25 MG tablet Take 1 tablet (25 mg total) by mouth every morning. (Patient not taking: Reported on 02/06/2024)   metroNIDAZOLE  (FLAGYL ) 500 MG tablet Take 1 tablet (500 mg total) by mouth 2 (two) times daily for 7 days. (Patient not taking: Reported on 02/06/2024)    Long Term Goals: Improvement in symptoms so as ready for discharge  Short Term Goals: Patient will verbalize feelings in meetings with treatment team members., Patient will attend at least of 50% of the groups daily., Pt will complete the PHQ9 on admission, day 3 and discharge., Patient will participate in completing the [place] Suicide Severity Rating Scale, Patient will score a low risk of violence for 24 hours prior to discharge, and Patient will take medications as prescribed daily.  Medical Decision Making  Admission to [facility]. Continue Clonidine  Detox Protocol.  Continue Agitation Protocol. Sertraline  50 mg PO daily    Recommendations  Based on my evaluation the patient does not appear to have an emergency medical condition.  Elston Halsted, NP 02/06/24  2:27 PM

## 2024-02-06 NOTE — ED Provider Notes (Signed)
 [place] [facility] AT [facility] Provider Note   CSN: [id number] Arrival date & time: 02/05/24  2251    History  Chief Complaint  Patient presents with   Suicidal    Kelly Hensley is a 28 y.o. female here for SI. States life has "not been going well from her" using fentanyl , last use when needed 10 hours ago.  Has had some nausea, myalgias and abdominal cramping.  Feels like how she typically feels when she is coming off of fentanyl .  No HI, AVH.  Denies plan for her SI.  Currently staying in hotel.  No fever, emesis, chest pain, shortness of breath, back pain.  No recent falls or injuries.  Denies any EtOH use. Has not bee sleeping well.  HPI     Home Medications Prior to Admission medications   Medication Sig Start Date End Date Taking? Authorizing Provider  hydrOXYzine  (ATARAX ) 50 MG tablet Take 1 tablet (50 mg total) by mouth every 6 (six) hours as needed for anxiety. 11/20/23   Zouev, Dmitri, MD  sertraline  (ZOLOFT ) 25 MG tablet Take 1 tablet (25 mg total) by mouth every morning. 11/21/23   Zouev, Dmitri, MD  traZODone  (DESYREL ) 50 MG tablet Take 1 tablet (50 mg total) by mouth at bedtime as needed for sleep. Take 1-2 tablets (50-100 mg) by mouth at bedtime as needed 11/20/23   Zouev, Dmitri, MD  norgestimate -ethinyl estradiol  (ORTHO-CYCLEN,SPRINTEC,PREVIFEM) 0.25-35 MG-MCG tablet Take 1 tablet by mouth daily. Patient not taking: Reported on 03/27/2020 09/27/18 03/27/20  Felipe Horton, [place] , CNM      Allergies    Patient has no known allergies.    Review of Systems   Review of Systems  Constitutional:  Positive for appetite change and fatigue.  HENT: Negative.    Respiratory: Negative.    Cardiovascular: Negative.   Gastrointestinal:  Positive for abdominal pain and nausea. Negative for vomiting.  Genitourinary: Negative.   Musculoskeletal:  Positive for myalgias.  Skin: Negative.   Neurological: Negative.   Psychiatric/Behavioral:  Positive for sleep  disturbance and suicidal ideas. Negative for agitation, behavioral problems, confusion, hallucinations and self-injury. The patient is not nervous/anxious and is not hyperactive.   All other systems reviewed and are negative.   Physical Exam Updated Vital Signs BP 128/81 (BP Location: Left Arm)   Pulse 76   Temp 98.7 F (37.1 C) (Oral)   Resp 18   SpO2 100%  Physical Exam Vitals and nursing note reviewed.  Constitutional:      General: She is not in acute distress.    Appearance: She is well-developed. She is not ill-appearing, toxic-appearing or diaphoretic.  HENT:     Head: Atraumatic.  Eyes:     Pupils: Pupils are equal, round, and reactive to light.  Cardiovascular:     Rate and Rhythm: Normal rate.  Pulmonary:     Effort: Pulmonary effort is normal. No respiratory distress.     Breath sounds: Normal breath sounds.  Abdominal:     General: Bowel sounds are normal. There is no distension.     Palpations: Abdomen is soft.     Tenderness: There is no abdominal tenderness. There is no right CVA tenderness, left CVA tenderness or guarding.     Comments: Old scars C/D/I. Non tender  Musculoskeletal:        General: Normal range of motion.     Cervical back: Normal range of motion.  Skin:    General: Skin is warm and dry.  Capillary Refill: Capillary refill takes less than 2 seconds.  Neurological:     General: No focal deficit present.     Mental Status: She is alert.  Psychiatric:        Attention and Perception: Attention and perception normal.        Mood and Affect: Mood normal. Affect is flat and tearful.        Speech: Speech normal.        Behavior: Behavior is cooperative.        Thought Content: Thought content is not paranoid or delusional. Thought content includes suicidal ideation. Thought content does not include homicidal ideation. Thought content does not include homicidal or suicidal plan.     ED Results / Procedures / Treatments   Labs (all labs  ordered are listed, but only abnormal results are displayed) Labs Reviewed  RAPID URINE DRUG SCREEN, HOSP PERFORMED - Abnormal; Notable for the following components:      Result Value   Cocaine POSITIVE (*)    Amphetamines POSITIVE (*)    All other components within normal limits  CBC WITH DIFFERENTIAL/PLATELET - Abnormal; Notable for the following components:   HCT 34.5 (*)    MCV 73.6 (*)    MCH 25.6 (*)    All other components within normal limits  URINALYSIS, W/ REFLEX TO CULTURE (INFECTION SUSPECTED) - Abnormal; Notable for the following components:   APPearance TURBID (*)    Specific Gravity, Urine 1.035 (*)    Protein, ur 30 (*)    All other components within normal limits  SALICYLATE LEVEL - Abnormal; Notable for the following components:   Salicylate Lvl <7.0 (*)    All other components within normal limits  ACETAMINOPHEN  LEVEL - Abnormal; Notable for the following components:   Acetaminophen  (Tylenol ), Serum <10 (*)    All other components within normal limits  URINE CULTURE  COMPREHENSIVE METABOLIC PANEL WITH GFR  ETHANOL  HCG, SERUM, QUALITATIVE    EKG None  Radiology No results found.  Procedures Procedures    Medications Ordered in ED Medications  acetaminophen  (TYLENOL ) tablet 650 mg (has no administration in time range)  ondansetron  (ZOFRAN ) tablet 4 mg (has no administration in time range)  dicyclomine  (BENTYL ) capsule 10 mg (10 mg Oral Given 02/06/24 0129)    ED Course/ Medical Decision Making/ A&P    28 yo here for SI and req fentanyl  detox. Sounds like increased life stressors at home. Denies plan. No HI, AVH.  Some nausea, abdominal cramping and myalgias.  States history of similar when she has been coming off fentanyl .  No UTI symptoms.  Denies chance of pregnancy.  She has benign abdominal exam.  Labs personally viewed interpreted No significant abnormality   Patient medically cleared. Disposition per TTS.                                Medical Decision Making Amount and/or Complexity of Data Reviewed External Data Reviewed: labs, radiology and notes. Labs: ordered. Decision-making details documented in ED Course.  Risk OTC drugs. Prescription drug management. Decision regarding hospitalization. Diagnosis or treatment significantly limited by social determinants of health.         Final Clinical Impression(s) / ED Diagnoses Final diagnoses:  Suicidal ideation  Substance use    Rx / DC Orders ED Discharge Orders     None         Charvez Voorhies A, PA-C 02/06/24  4098    Eldon Greenland, MD 02/06/24 [phone]

## 2024-02-06 NOTE — ED Provider Notes (Signed)
 Patient had wanted STI screening prior to discharge.  She has self swab.  Subsequently after being admitted at [facility], wet prep came back positive for BV with clue cells.  RN to call over and confirm treatment with flagyl  - I've prescribed this to her pharmacy as well.  We'll need to follow up on STI panel screening which was also sent off   Arvilla Birmingham, MD 02/06/24 1034

## 2024-02-06 NOTE — Progress Notes (Signed)
 02/06/2024  0915  Safe transport called to transport patient to [facility].

## 2024-02-06 NOTE — ED Notes (Signed)
 Patient admitted from [facility].  Patient reports daily fentanyl  and THC use.  She is smoking these drugs.  Patient also positive for bacterial vaginosis and flagyl  is ordered for that.  Patient reports withdrawal symptoms of bodyaches, diahrea, chills and somnolence.  Patient cooperative with admission process and was oriented to unit and shown to her room.  Patient denies avh shi or plan.  Will monitor.
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
## 2024-02-06 NOTE — ED Notes (Signed)
 Pt has been resting in bed since arrival to unit.  Expressing feelings of malaise and going through withdrawals/  OOB for dinner   Did not endorse SI plan or intent at present. Q 15 minute observations for safety continue

## 2024-02-07 LAB — GC/CHLAMYDIA PROBE AMP (~~LOC~~) NOT AT ARMC
Chlamydia: NEGATIVE
Comment: NEGATIVE
Comment: NORMAL
Neisseria Gonorrhea: NEGATIVE

## 2024-02-07 NOTE — ED Notes (Signed)
 Patient is sleeping. Respirations equal and unlabored, skin warm and dry. No change in assessment or acuity. Routine safety checks conducted according to facility protocol. Will continue to monitor for safety.

## 2024-02-07 NOTE — ED Notes (Signed)
 Patient A&Ox4. Denies intent to harm self/others when asked. Denies A/VH. Patient denies any physical complaints when asked. Pt states, "when are they going to start me on Suboxone . They did it the last time I was here. I was suppose to keep my appt the last time I was here but shit happens. That's all I can really say about it. But I came back so I can reboot and get started on it again". Writer encouraged pt to remain compliant with provider's action plan for continued progression after discharge. Pt agree. Informed pt that only a provider can make decision to restart Suboxone  since pt haven't used Suboxone  since Feb. 2025. Support and encouragement provided. Routine safety checks conducted according to facility protocol. Encouraged patient to notify staff if thoughts of harm toward self or others arise. Patient verbalize understanding and agreement. Will continue to monitor for safety.
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
## 2024-02-07 NOTE — ED Notes (Signed)
 Patient A&O x 4, ambulatory. Patient discharged in no acute distress. Patient denied SI/HI, A/VH upon discharge. Patient verbalized understanding of all discharge instructions reviewed on AVS via staff, to include follow up appointments, RX's and safety. Suicide safety plan completed and reviewed with Clinical research associate. A copy given to pt. Pt belongings returned to patient from locker #1 intact. Patient escorted to lobby via staff with bus pass in hand. Safety maintained.

## 2024-02-07 NOTE — Discharge Instructions (Addendum)
 LCSW spoke with patient regarding plans at discharge. Patient aware of resources provided at the [facility] New Patient Assessment/Therapy Walk ins:. Instructions provided in AVS. Patient expressed appreciation for LCSW assistance with discharge planning. No other needs were reported at this time. LCSW to sign off. Please inform if further LCSW needs arise prior to discharge.   [facility] New Patient Assessment/Therapy Walk ins:  Walk in hours for open access for psychiatry are Monday-Friday 8 am to 11 pm. Appointments are limited, so please arrive at 7:00 am.   Open access for therapy appointments is Monday-Friday from 8:00 am to 11:00 am and Friday from 1 pm to 4 pm. Please arrive at 7:30 am.  Specialty:    Urgent Care (On Second Floor) [address] [place] [address] [phone]

## 2024-02-07 NOTE — ED Provider Notes (Signed)
 [facility]BC/OBS ASAP Discharge Summary  Date and Time: 02/07/2024 3:50 PM  Name: Kelly Hensley  MRN:  [id number]   Discharge Diagnoses:  Final diagnoses:  Substance abuse (HCC)    Subjective: "I am ready to go, there is nothing you are doing for me, that I can not do myself"  Stay Summary:  Kelly Hensley  a 28yo female, with past psychiatric history of substance induced mood disorder, severe opioid use disorder, tobacco use, PTSD   and major depressive disorder, who presented to the emergency department with concern for worsening withdrawal symptoms as she was trying to abstain from  Fentanyl  use.  Patient shared that she was   going through fentanyl  withdrawal and still struggling with the loss of her children's father who died 2 years ago. Also was feeling  very depressed because she is currently homeless, unemployed, with no support. She reported she was using  fentanyl  daily, approximately 50mg . She has history of detox/ rehabilitation programs but none recently. Reported  she is not looking into going back to rehab because she is currently talking to someone with a potential strong relationship. She was encouraged  to seek help with her substance use problems. Denied legal history attributed to drug use.   Patient endorsed daily anxiety and depression related to stressors and recent relapse. She reported that past trauma has a lot to do with her current situation: reported she continues to think about the death of her child's father who died in front of her through a physical altercation.  She reported trouble of sleeping, decreased appetite, hopelessness, worthlessness, guilt/shame. Has been experiencing stomach upset with diarrhea and nausea. One prior suicide attempt.    Assessment: Patient is seen in the milieu, frequently using the phone to talk to her friend. She is evaluated face-to-face n her room to ensure privacy.  Patient reports her family is not supportive. Her mother is around but not in her  life. Father not in the picture but is known with hx of a mental illness and substance use. Patient reports having financial difficulties and she has started begging on the street. Patient is currently homeless.  She reports that she has started talking to a female friend and doesn't want to lose him when she goes to rehab. She reports that ""I am ready to go, there is nothing you are doing for me, that I can not do myself". Patient is irritable and minimizes her need for treatment.  She requests to be discharged "now" and states "I understand that its my right to make my health decisions". She reports she does not feel like staying here any longer, "its a waste of time".   Patient is encouraged to follow up in outpatient services. She reports "that's my plan". Resources are provided . Denies SI/HI/AVH upon discharge.     Total Time spent with patient: 45 minutes  Past Psychiatric History: Substance abuse, MDD, PTSD Past Medical History: NA Family History: NA Family Psychiatric History: NA Social History: NA Tobacco Cessation:  N/A, patient does not currently use tobacco products  Current Medications:  Current Facility-Administered Medications  Medication Dose Route Frequency Provider Last Rate Last Admin   acetaminophen  (TYLENOL ) tablet 650 mg  650 mg Oral Q6H PRN Weber, Kyra A, NP   650 mg at 02/06/24 1113   alum & mag hydroxide-simeth (MAALOX/MYLANTA) 200-200-20 MG/5ML suspension 30 mL  30 mL Oral Q4H PRN Weber, Kyra A, NP       cloNIDine  (CATAPRES ) tablet 0.1 mg  0.1 mg Oral QID Nicklas Barns, MD   0.1 mg at 02/07/24 1427   Followed by   Cecily Cohen ON 02/08/2024] cloNIDine  (CATAPRES ) tablet 0.1 mg  0.1 mg Oral BH-qamhs Bethea, Jessie Morning, MD       Followed by   Cecily Cohen ON 02/10/2024] cloNIDine  (CATAPRES ) tablet 0.1 mg  0.1 mg Oral QAC breakfast Nicklas Barns, MD       dicyclomine  (BENTYL ) tablet 20 mg  20 mg Oral Q6H PRN Bethea, Terrence C, MD   20 mg at 02/06/24 1332   haloperidol   (HALDOL ) tablet 5 mg  5 mg Oral TID PRN Weber, Kyra A, NP       And   diphenhydrAMINE  (BENADRYL ) capsule 50 mg  50 mg Oral TID PRN Weber, Kyra A, NP       haloperidol  lactate (HALDOL ) injection 5 mg  5 mg Intramuscular TID PRN Weber, Kyra A, NP       And   diphenhydrAMINE  (BENADRYL ) injection 50 mg  50 mg Intramuscular TID PRN Weber, Kyra A, NP       And   LORazepam  (ATIVAN ) injection 2 mg  2 mg Intramuscular TID PRN Weber, Kyra A, NP       haloperidol  lactate (HALDOL ) injection 10 mg  10 mg Intramuscular TID PRN Weber, Kyra A, NP       And   diphenhydrAMINE  (BENADRYL ) injection 50 mg  50 mg Intramuscular TID PRN Weber, Kyra A, NP       And   LORazepam  (ATIVAN ) injection 2 mg  2 mg Intramuscular TID PRN Weber, Kyra A, NP       hydrOXYzine  (ATARAX ) tablet 25 mg  25 mg Oral TID PRN Weber, Kyra A, NP   25 mg at 02/06/24 1113   loperamide  (IMODIUM ) capsule 2-4 mg  2-4 mg Oral PRN Bethea, Terrence C, MD       magnesium  hydroxide (MILK OF MAGNESIA) suspension 30 mL  30 mL Oral Daily PRN Weber, Kyra A, NP       methocarbamol  (ROBAXIN ) tablet 500 mg  500 mg Oral Q8H PRN Nicklas Barns, MD   500 mg at 02/06/24 1332   metroNIDAZOLE  (FLAGYL ) tablet 500 mg  500 mg Oral Q12H Nicklas Barns, MD   500 mg at 02/07/24 1010   naproxen  (NAPROSYN ) tablet 500 mg  500 mg Oral BID PRN Bethea, Terrence C, MD   500 mg at 02/06/24 1332   ondansetron  (ZOFRAN -ODT) disintegrating tablet 4 mg  4 mg Oral Q6H PRN Nicklas Barns, MD       sertraline  (ZOLOFT ) tablet 25 mg  25 mg Oral Daily Weber, Kyra A, NP   25 mg at 02/07/24 1010   traZODone  (DESYREL ) tablet 50 mg  50 mg Oral QHS PRN Weber, Kyra A, NP   50 mg at 02/06/24 2133   Current Outpatient Medications  Medication Sig Dispense Refill   sertraline  (ZOLOFT ) 25 MG tablet Take 1 tablet (25 mg total) by mouth every morning. (Patient not taking: Reported on 02/06/2024) 30 tablet 0   traZODone  (DESYREL ) 50 MG tablet Take 1 tablet (50 mg total) by mouth at  bedtime as needed for sleep. Take 1-2 tablets (50-100 mg) by mouth at bedtime as needed (Patient not taking: Reported on 02/06/2024) 60 tablet 0    PTA Medications:  Facility Ordered Medications  Medication   [COMPLETED] dicyclomine  (BENTYL ) capsule 10 mg   sertraline  (ZOLOFT ) tablet 25 mg   acetaminophen  (TYLENOL ) tablet 650 mg  alum & mag hydroxide-simeth (MAALOX/MYLANTA) 200-200-20 MG/5ML suspension 30 mL   magnesium  hydroxide (MILK OF MAGNESIA) suspension 30 mL   haloperidol  (HALDOL ) tablet 5 mg   And   diphenhydrAMINE  (BENADRYL ) capsule 50 mg   haloperidol  lactate (HALDOL ) injection 5 mg   And   diphenhydrAMINE  (BENADRYL ) injection 50 mg   And   LORazepam  (ATIVAN ) injection 2 mg   haloperidol  lactate (HALDOL ) injection 10 mg   And   diphenhydrAMINE  (BENADRYL ) injection 50 mg   And   LORazepam  (ATIVAN ) injection 2 mg   hydrOXYzine  (ATARAX ) tablet 25 mg   traZODone  (DESYREL ) tablet 50 mg   metroNIDAZOLE  (FLAGYL ) tablet 500 mg   dicyclomine  (BENTYL ) tablet 20 mg   loperamide  (IMODIUM ) capsule 2-4 mg   methocarbamol  (ROBAXIN ) tablet 500 mg   naproxen  (NAPROSYN ) tablet 500 mg   ondansetron  (ZOFRAN -ODT) disintegrating tablet 4 mg   cloNIDine  (CATAPRES ) tablet 0.1 mg   Followed by   Cecily Cohen ON 02/08/2024] cloNIDine  (CATAPRES ) tablet 0.1 mg   Followed by   Cecily Cohen ON 02/10/2024] cloNIDine  (CATAPRES ) tablet 0.1 mg   PTA Medications  Medication Sig   traZODone  (DESYREL ) 50 MG tablet Take 1 tablet (50 mg total) by mouth at bedtime as needed for sleep. Take 1-2 tablets (50-100 mg) by mouth at bedtime as needed (Patient not taking: Reported on 02/06/2024)   sertraline  (ZOLOFT ) 25 MG tablet Take 1 tablet (25 mg total) by mouth every morning. (Patient not taking: Reported on 02/06/2024)       02/07/2024    2:45 PM 02/06/2024    2:26 PM 11/20/2023    8:49 AM  Depression screen PHQ 2/9  Decreased Interest 1 2 0  Down, Depressed, Hopeless 1 2 0  PHQ - 2 Score 2 4 0  Altered sleeping 1 1    Tired, decreased energy 1 2   Change in appetite 1 1   Feeling bad or failure about yourself  1 2   Trouble concentrating 1 1   Moving slowly or fidgety/restless 0 1   Suicidal thoughts 0 0   PHQ-9 Score 7 12   Difficult doing work/chores Somewhat difficult Very difficult     Flowsheet Row ED from 02/06/2024 in [facility] ED from 02/05/2024 in [facility] at [facility] ED from 11/16/2023 in [facility]  C-SSRS RISK CATEGORY Low Risk Low Risk Low Risk       Musculoskeletal  Strength & Muscle Tone: within normal limits Gait & Station: normal Patient leans: N/A  Psychiatric Specialty Exam  Presentation  General Appearance:  Casual  Eye Contact: Fair  Speech: Clear and Coherent  Speech Volume: Normal  Handedness: Right   Mood and Affect  Mood: Anxious  Affect: Congruent   Thought Process  Thought Processes: Coherent  Descriptions of Associations:Intact  Orientation:Full (Time, Place and Person)  Thought Content:WDL  Diagnosis of Schizophrenia or Schizoaffective disorder in past: No    Hallucinations:Hallucinations: None  Ideas of Reference:None  Suicidal Thoughts:Suicidal Thoughts: No  Homicidal Thoughts:Homicidal Thoughts: No   Sensorium  Memory: Immediate Fair; Recent Fair; Remote Fair  Judgment: Fair  Insight: Fair   Art therapist  Concentration: Fair  Attention Span: Fair  Recall: [employer] of Knowledge: Fair  Language: Fair   Psychomotor Activity  Psychomotor Activity: Psychomotor Activity: Restlessness   Assets  Assets: Communication Skills; Desire for Improvement; Physical Health   Sleep  Sleep: Sleep: Fair Number of Hours of Sleep: 4  Nutritional Assessment (For OBS and FBC admissions only) Has the patient had a weight loss or gain of 10 pounds or more in the last 3 months?: No Has the patient had a  decrease in food intake/or appetite?: No Does the patient have dental problems?: No Does the patient have eating habits or behaviors that may be indicators of an eating disorder including binging or inducing vomiting?: No Has the patient recently lost weight without trying?: 0 Has the patient been eating poorly because of a decreased appetite?: 0 Malnutrition Screening Tool Score: 0    Physical Exam  Physical Exam Vitals and nursing note reviewed.  Constitutional:      Appearance: Normal appearance.  HENT:     Head: Normocephalic and atraumatic.     Right Ear: Tympanic membrane normal.     Left Ear: Tympanic membrane normal.     Nose: Nose normal.     Mouth/Throat:     Mouth: Mucous membranes are moist.  Eyes:     Extraocular Movements: Extraocular movements intact.     Pupils: Pupils are equal, round, and reactive to light.  Cardiovascular:     Rate and Rhythm: Normal rate.     Pulses: Normal pulses.  Pulmonary:     Effort: Pulmonary effort is normal.  Musculoskeletal:        General: Normal range of motion.     Cervical back: Normal range of motion and neck supple.  Neurological:     General: No focal deficit present.     Mental Status: She is alert and oriented to person, place, and time.    Review of Systems  Constitutional: Negative.   HENT: Negative.    Eyes: Negative.   Respiratory: Negative.    Cardiovascular: Negative.   Gastrointestinal: Negative.   Genitourinary: Negative.   Musculoskeletal: Negative.   Skin: Negative.   Neurological: Negative.   Endo/Heme/Allergies: Negative.   Psychiatric/Behavioral:  Positive for depression and substance abuse.    Blood pressure 115/61, pulse 60, temperature 99.5 F (37.5 C), temperature source Oral, resp. rate 18, SpO2 100%. There is no height or weight on file to calculate BMI.  Demographic Factors:  Low socioeconomic status and Unemployed  Loss Factors: Financial problems/change in socioeconomic  status  Historical Factors: NA  Risk Reduction Factors:   NA  Continued Clinical Symptoms:  Depression:   Impulsivity More than one psychiatric diagnosis  Cognitive Features That Contribute To Risk:  None    Suicide Risk:  Minimal: No identifiable suicidal ideation.  Patients presenting with no risk factors but with morbid ruminations; may be classified as minimal risk based on the severity of the depressive symptoms  Plan Of Care/Follow-up recommendations:  Activity:  As tolerated Diet:  Regular  Disposition: Discharge  Elston Halsted, NP 02/07/2024, 3:50 PM

## 2024-02-07 NOTE — Discharge Planning (Signed)
 LCSW met with patient to assess current mood, affect, physical state, and inquire about needs/goals while here in [facility] and after discharge. Patient reports she presented due to "needing to press the reset button". Patient was guarded and did report use of fentanyl  primarily and reported using 1/2 gram daily off and on for last 5 years. Patient also reports smoking heroine as well and cannabis on occasion. She denies any other use of alcohol or any other substances.   Patient stated she had been living with a girlfriend and homeless. She also stated that she has 2 children 65 and 24 years old that she had to give to the stated because she couldn't take care of them. Patient denies having access to transportation, and reports having limited social support despite a new boyfriend she met and feels is a good support and wants to "get clean for clean". Patient reports her current goal is to start taking subaxoner and follow up with this outpatient provider for suboxone .   Patient initially stated that was why she came here was to get back on the subaxone because she missed her out patient appointment but could not remember where she had this appointment scheduled for. SW discussed out walk in clinic and other MAT resources and will provide a list for patient. She stated she was not interested in seeing a therapist or anyone else outpatient basis other than for medication management with the subaxone. residential placement/sober living/outpatient resources for substance use. Patient denies any prior history of outpatient or inpatient substance abuse treatment. Patient currently denies any SI/HI/AVH and reports mood as anxious. No other needs were reported at this time by patient. SW will continue to follow and provide supports as clinically indicated.

## 2024-02-08 LAB — URINE CULTURE

## 2024-04-18 ENCOUNTER — Emergency Department (HOSPITAL_COMMUNITY)
Admission: EM | Admit: 2024-04-18 | Discharge: 2024-04-18 | Disposition: A | Discharge status: Home / Self Care | Payer: MEDICAID | Attending: Emergency Medicine | Admitting: Emergency Medicine

## 2024-04-18 ENCOUNTER — Encounter (HOSPITAL_COMMUNITY): Payer: Self-pay

## 2024-04-18 DIAGNOSIS — J029 Acute pharyngitis, unspecified: Secondary | ICD-10-CM | POA: Diagnosis present

## 2024-04-18 NOTE — ED Provider Notes (Signed)
 [place] EMERGENCY DEPARTMENT AT [facility] Provider Note   CSN: [id number] Arrival date & time: 04/18/24  9770     Patient presents with: Sore Throat   Kelly Hensley is a 28 y.o. female.   The history is provided by the patient and medical records.  Sore Throat   28 y.o. F here with sore throat.  Only complained of this after she was placed under arrest for outstanding warrants.  Also states she feels some withdrawals from fentanyl .  Last used around 9PM yesterday evening.  She has not had any vomiting, diarrhea, sweats/chills, etc.    Prior to Admission medications   Medication Sig Start Date End Date Taking? Authorizing Provider  sertraline  (ZOLOFT ) 25 MG tablet Take 1 tablet (25 mg total) by mouth every morning. Patient not taking: Reported on 02/06/2024 11/21/23   Zouev, Dmitri, MD  traZODone  (DESYREL ) 50 MG tablet Take 1 tablet (50 mg total) by mouth at bedtime as needed for sleep. Take 1-2 tablets (50-100 mg) by mouth at bedtime as needed Patient not taking: Reported on 02/06/2024 11/20/23   Zouev, Dmitri, MD  norgestimate -ethinyl estradiol  (ORTHO-CYCLEN,SPRINTEC,PREVIFEM) 0.25-35 MG-MCG tablet Take 1 tablet by mouth daily. Patient not taking: Reported on 03/27/2020 09/27/18 03/27/20  Claudene, [place] , CNM    Allergies: Patient has no known allergies.    Review of Systems  HENT:  Positive for sore throat.   All other systems reviewed and are negative.   Updated Vital Signs BP (!) 122/91   Pulse 78   Temp 99.1 F (37.3 C) (Oral)   Resp 16   SpO2 100%   Physical Exam Vitals and nursing note reviewed.  Constitutional:      General: She is not in acute distress.    Appearance: She is well-developed. She is not ill-appearing or diaphoretic.  HENT:     Head: Normocephalic and atraumatic.     Mouth/Throat:     Comments: Mild area edema of posterior oropharynx but there is no significant tonsillar edema or exudates, handling secretions well, no stridor Eyes:      Conjunctiva/sclera: Conjunctivae normal.     Pupils: Pupils are equal, round, and reactive to light.  Cardiovascular:     Rate and Rhythm: Normal rate and regular rhythm.     Heart sounds: Normal heart sounds.  Pulmonary:     Effort: Pulmonary effort is normal.     Breath sounds: Normal breath sounds.  Abdominal:     General: Bowel sounds are normal.     Palpations: Abdomen is soft.  Musculoskeletal:        General: Normal range of motion.     Cervical back: Normal range of motion.  Skin:    General: Skin is warm and dry.  Neurological:     Mental Status: She is alert and oriented to person, place, and time.     (all labs ordered are listed, but only abnormal results are displayed) Labs Reviewed - No data to display  EKG: None  Radiology: No results found.   Procedures   Medications Ordered in the ED - No data to display                                  Medical Decision Making  28 year old female presenting to the ED with sore throat.  Complained of this after being placed under arrest for outstanding warrants.  Also reports fentanyl  withdrawal.  She  is afebrile and nontoxic in appearance here.  She is not diaphoretic and does not appear to be in any distress whatsoever.  She has some mild erythema of the posterior oropharynx but no significant tonsillar edema or exudates.  She is handling secretions well, normal phonation without stridor.  She has no airway compromise.  I do not feel she needs further testing or emergent management at this time.  She is stable for discharge to jail.  Final diagnoses:  Sore throat    ED Discharge Orders     None          Jarold Olam HERO, PA-C 04/18/24 0257    Haze Lonni PARAS, MD 04/18/24 [phone]

## 2024-04-18 NOTE — ED Triage Notes (Signed)
 Pt comes via [facility] for sore throat that started when she got to the jail. Pt states that she is also going thru withdraws from fentanyl , last used at 9pm tonight

## 2024-08-21 ENCOUNTER — Encounter (HOSPITAL_COMMUNITY): Payer: Self-pay

## 2024-08-21 ENCOUNTER — Ambulatory Visit (HOSPITAL_COMMUNITY)
Admission: EM | Admit: 2024-08-21 | Discharge: 2024-08-21 | Disposition: A | Discharge status: Home / Self Care | Payer: MEDICAID | Attending: Internal Medicine | Admitting: Internal Medicine

## 2024-08-21 DIAGNOSIS — Z9189 Other specified personal risk factors, not elsewhere classified: Secondary | ICD-10-CM | POA: Diagnosis not present

## 2024-08-21 LAB — HIV ANTIBODY (ROUTINE TESTING W REFLEX): HIV Screen 4th Generation wRfx: NONREACTIVE

## 2024-08-21 NOTE — ED Triage Notes (Signed)
 Patient reports that she has had a white vaginal discharge and states she does not know how many days that she has had. Patient denies any urinary symptoms or abdominal pain at this time.

## 2024-08-21 NOTE — Discharge Instructions (Addendum)
 STD testing pending, this will take 2-3 days to result. We will only call you if your testing is positive for any infection(s) and we will provide treatment.  Avoid sexual intercourse until your STD results come back.  If any of your STD results are positive, you will need to avoid sexual intercourse for 7 days while you are being treated to prevent spread of STD.  Condom use is the best way to prevent spread of STDs. Notify partner(s) of any positive results.  Return to urgent care as needed.
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
## 2024-08-22 ENCOUNTER — Ambulatory Visit (HOSPITAL_COMMUNITY): Payer: Self-pay

## 2024-08-22 LAB — CERVICOVAGINAL ANCILLARY ONLY
Bacterial Vaginitis (gardnerella): POSITIVE — AB
Candida Glabrata: NEGATIVE
Candida Vaginitis: NEGATIVE
Chlamydia: NEGATIVE
Comment: NEGATIVE
Comment: NEGATIVE
Comment: NEGATIVE
Comment: NEGATIVE
Comment: NEGATIVE
Comment: NORMAL
Neisseria Gonorrhea: NEGATIVE
Trichomonas: NEGATIVE

## 2024-08-22 LAB — SYPHILIS: RPR W/REFLEX TO RPR TITER AND TREPONEMAL ANTIBODIES, TRADITIONAL SCREENING AND DIAGNOSIS ALGORITHM: RPR Ser Ql: NONREACTIVE

## 2024-08-22 MED ORDER — METRONIDAZOLE 500 MG PO TABS: 500.0000 mg | ORAL_TABLET | Freq: Two times a day (BID) | ORAL | 0 refills | Status: AC

## 2024-10-10 ENCOUNTER — Emergency Department (HOSPITAL_COMMUNITY)
Admission: EM | Admit: 2024-10-10 | Discharge: 2024-10-10 | Disposition: A | Discharge status: Home / Self Care | Payer: MEDICAID | Attending: Emergency Medicine | Admitting: Emergency Medicine

## 2024-10-10 ENCOUNTER — Other Ambulatory Visit: Payer: Self-pay

## 2024-10-10 DIAGNOSIS — F1410 Cocaine abuse, uncomplicated: Secondary | ICD-10-CM | POA: Diagnosis present

## 2024-10-10 DIAGNOSIS — F1413 Cocaine abuse, unspecified with withdrawal: Secondary | ICD-10-CM | POA: Diagnosis not present

## 2024-10-10 DIAGNOSIS — F1990 Other psychoactive substance use, unspecified, uncomplicated: Secondary | ICD-10-CM

## 2024-10-10 DIAGNOSIS — E876 Hypokalemia: Secondary | ICD-10-CM | POA: Insufficient documentation

## 2024-10-10 DIAGNOSIS — F1110 Opioid abuse, uncomplicated: Secondary | ICD-10-CM | POA: Insufficient documentation

## 2024-10-10 DIAGNOSIS — F19939 Other psychoactive substance use, unspecified with withdrawal, unspecified: Secondary | ICD-10-CM

## 2024-10-10 LAB — CBC
HCT: 33.3 % — ABNORMAL LOW (ref 36.0–46.0)
Hemoglobin: 11.8 g/dL — ABNORMAL LOW (ref 12.0–15.0)
MCH: 26.2 pg (ref 26.0–34.0)
MCHC: 35.4 g/dL (ref 30.0–36.0)
MCV: 74 fL — ABNORMAL LOW (ref 80.0–100.0)
Platelets: 332 K/uL (ref 150–400)
RBC: 4.5 MIL/uL (ref 3.87–5.11)
RDW: 13.3 % (ref 11.5–15.5)
WBC: 8.4 K/uL (ref 4.0–10.5)
nRBC: 0 % (ref 0.0–0.2)

## 2024-10-10 LAB — COMPREHENSIVE METABOLIC PANEL WITH GFR
ALT: 8 U/L (ref 0–44)
AST: 10 U/L — ABNORMAL LOW (ref 15–41)
Albumin: 4.2 g/dL (ref 3.5–5.0)
Alkaline Phosphatase: 65 U/L (ref 38–126)
Anion gap: 9 (ref 5–15)
BUN: 7 mg/dL (ref 6–20)
CO2: 27 mmol/L (ref 22–32)
Calcium: 9.3 mg/dL (ref 8.9–10.3)
Chloride: 103 mmol/L (ref 98–111)
Creatinine, Ser: 0.82 mg/dL (ref 0.44–1.00)
GFR, Estimated: >60 mL/min
Glucose, Bld: 113 mg/dL — ABNORMAL HIGH (ref 70–99)
Potassium: 3.4 mmol/L — ABNORMAL LOW (ref 3.5–5.1)
Sodium: 140 mmol/L (ref 135–145)
Total Bilirubin: <0.2 mg/dL (ref 0.0–1.2)
Total Protein: 6.9 g/dL (ref 6.5–8.1)

## 2024-10-10 LAB — URINE DRUG SCREEN
Amphetamines: POSITIVE — AB
Barbiturates: NEGATIVE
Benzodiazepines: NEGATIVE
Cocaine: POSITIVE — AB
Fentanyl: POSITIVE — AB
Methadone Scn, Ur: NEGATIVE
Opiates: NEGATIVE
Tetrahydrocannabinol: NEGATIVE

## 2024-10-10 LAB — ETHANOL: Alcohol, Ethyl (B): <15 mg/dL

## 2024-10-10 MED ORDER — IBUPROFEN 200 MG PO TABS
400.0000 mg | ORAL_TABLET | Freq: Once | ORAL | Status: AC
  Administered 2024-10-10: 400 mg via ORAL
  Filled 2024-10-10: qty 2

## 2024-10-10 MED ORDER — POTASSIUM CHLORIDE CRYS ER 20 MEQ PO TBCR
40.0000 meq | EXTENDED_RELEASE_TABLET | Freq: Once | ORAL | Status: AC
  Administered 2024-10-10: 40 meq via ORAL
  Filled 2024-10-10: qty 2

## 2024-10-10 MED ORDER — ACETAMINOPHEN 500 MG PO TABS
1000.0000 mg | ORAL_TABLET | Freq: Once | ORAL | Status: AC
  Administered 2024-10-10: 1000 mg via ORAL
  Filled 2024-10-10: qty 2

## 2024-10-10 MED ORDER — ONDANSETRON 8 MG PO TBDP
8.0000 mg | ORAL_TABLET | Freq: Once | ORAL | Status: AC
  Administered 2024-10-10: 8 mg via ORAL
  Filled 2024-10-10: qty 1

## 2024-10-10 NOTE — ED Triage Notes (Signed)
 Patient in today reporting withdrawals from crack cocaine last use 1 hour ago. Fentanyl  5 hours ago. Generalized body headaches.

## 2024-10-10 NOTE — Discharge Instructions (Addendum)
 It was our pleasure to provide your ER care today - we hope that you feel better.  Avoid drug use as it is harmful to your physical health and mental well-being. See resource guide attached in terms of accessing inpatient or outpatient substance use treatment programs.  You may also go to the [facility] to coordinate close follow up and/or treatment program for substance use disorder.   Follow up closely with primary care doctor and behavioral health provider in the next few days.  Your potassium level is slightly low - eat plenty of fruits and vegetables, and follow up closely with primary care doctor.   For mental health issues and/or crisis, you may also go directly to the [facility] - they are open 24/7 and walk-ins are welcome.    Return to ER if worse, new symptoms, fevers, chest pain, trouble breathing, persistent vomiting, or other emergency concern.

## 2024-10-10 NOTE — ED Notes (Signed)
Cup of water given.

## 2024-10-10 NOTE — ED Provider Notes (Signed)
 " [place] EMERGENCY DEPARTMENT AT [facility] Provider Note   CSN: [id number] Arrival date & time: 10/10/24  9093     Patient presents with: Withdrawal   TONEKA FULLEN is a 29 y.o. female.   Pt with c/o feeling as if having withdrawal symptoms - indicates uses crack cocaine and at times uses fentanyl . Denies etoh use problems. Denies other prescription medication use/abuse. Nausea. No vomiting. No abd pain or distension. Having normal bms, no diarrhea. No fever or chills or sweats. +general body aches. No focal area of pain. No headaches. No chest pain or sob. No abd pain. No gu c/o. No extremity pain or swelling.  Denies depression or thoughts of self harm. In general has had normal appetite, no wt loss.         Prior to Admission medications  Medication Sig Start Date End Date Taking? Authorizing Provider  sertraline  (ZOLOFT ) 25 MG tablet Take 1 tablet (25 mg total) by mouth every morning. Patient not taking: Reported on 02/06/2024 11/21/23   Zouev, Dmitri, MD  traZODone  (DESYREL ) 50 MG tablet Take 1 tablet (50 mg total) by mouth at bedtime as needed for sleep. Take 1-2 tablets (50-100 mg) by mouth at bedtime as needed Patient not taking: Reported on 02/06/2024 11/20/23   Zouev, Dmitri, MD  norgestimate -ethinyl estradiol  (ORTHO-CYCLEN,SPRINTEC,PREVIFEM) 0.25-35 MG-MCG tablet Take 1 tablet by mouth daily. Patient not taking: Reported on 03/27/2020 09/27/18 03/27/20  Claudene, [place] , CNM    Allergies: Patient has no known allergies.    Review of Systems  Constitutional:  Negative for chills and fever.  HENT:  Negative for sore throat.   Eyes:  Negative for visual disturbance.  Respiratory:  Negative for cough and shortness of breath.   Cardiovascular:  Negative for chest pain.  Gastrointestinal:  Positive for nausea. Negative for abdominal pain and vomiting.  Genitourinary:  Negative for dysuria.  Musculoskeletal:  Positive for myalgias. Negative for back pain and neck  pain.  Neurological:  Negative for headaches.  Psychiatric/Behavioral:  Negative for confusion.     Updated Vital Signs BP (!) 140/78 (BP Location: Right Arm)   Pulse 73   Temp 97.9 F (36.6 C) (Oral)   Ht 1.753 m (5' 9)   Wt 89.4 kg   SpO2 98%   BMI 29.11 kg/m   Physical Exam Vitals and nursing note reviewed.  Constitutional:      Appearance: Normal appearance. She is well-developed.  HENT:     Head: Atraumatic.     Nose: Nose normal.     Mouth/Throat:     Mouth: Mucous membranes are moist.     Pharynx: Oropharynx is clear.  Eyes:     General: No scleral icterus.    Conjunctiva/sclera: Conjunctivae normal.     Pupils: Pupils are equal, round, and reactive to light.  Neck:     Trachea: No tracheal deviation.  Cardiovascular:     Rate and Rhythm: Normal rate and regular rhythm.     Pulses: Normal pulses.     Heart sounds: Normal heart sounds. No murmur heard.    No friction rub. No gallop.  Pulmonary:     Effort: Pulmonary effort is normal. No respiratory distress.     Breath sounds: Normal breath sounds.  Abdominal:     General: Bowel sounds are normal. There is no distension.     Palpations: Abdomen is soft.     Tenderness: There is no abdominal tenderness.  Genitourinary:    Comments: No cva  tenderness.  Musculoskeletal:        General: No swelling or tenderness.     Cervical back: Normal range of motion and neck supple. No rigidity. No muscular tenderness.     Right lower leg: No edema.     Left lower leg: No edema.  Skin:    General: Skin is warm and dry.     Findings: No rash.  Neurological:     Mental Status: She is alert.     Comments: Alert, speech normal. Motor/sens grossly intact bil. Steady gait.   Psychiatric:        Mood and Affect: Mood normal.     Comments: Mood and affect grossly normal. Pt does not appear to be responding to internal stimuli. No thoughts/plan of self harm.      (all labs ordered are listed, but only abnormal results are  displayed) Results for orders placed or performed during the hospital encounter of 10/10/24  Comprehensive metabolic panel with GFR   Collection Time: 10/10/24 12:28 PM  Result Value Ref Range   Sodium 140 135 - 145 mmol/L   Potassium 3.4 (L) 3.5 - 5.1 mmol/L   Chloride 103 98 - 111 mmol/L   CO2 27 22 - 32 mmol/L   Glucose, Bld 113 (H) 70 - 99 mg/dL   BUN 7 6 - 20 mg/dL   Creatinine, Ser 9.17 0.44 - 1.00 mg/dL   Calcium 9.3 8.9 - 89.6 mg/dL   Total Protein 6.9 6.5 - 8.1 g/dL   Albumin 4.2 3.5 - 5.0 g/dL   AST 10 (L) 15 - 41 U/L   ALT 8 0 - 44 U/L   Alkaline Phosphatase 65 38 - 126 U/L   Total Bilirubin <0.2 0.0 - 1.2 mg/dL   GFR, Estimated >39 >39 mL/min   Anion gap 9 5 - 15  CBC   Collection Time: 10/10/24 12:28 PM  Result Value Ref Range   WBC 8.4 4.0 - 10.5 K/uL   RBC 4.50 3.87 - 5.11 MIL/uL   Hemoglobin 11.8 (L) 12.0 - 15.0 g/dL   HCT 66.6 (L) 63.9 - 53.9 %   MCV 74.0 (L) 80.0 - 100.0 fL   MCH 26.2 26.0 - 34.0 pg   MCHC 35.4 30.0 - 36.0 g/dL   RDW 86.6 88.4 - 84.4 %   Platelets 332 150 - 400 K/uL   nRBC 0.0 0.0 - 0.2 %  Ethanol   Collection Time: 10/10/24 12:28 PM  Result Value Ref Range   Alcohol, Ethyl (B) <15 <15 mg/dL     EKG: None  Radiology: No results found.   Procedures   Medications Ordered in the ED  potassium chloride  SA (KLOR-CON  M) CR tablet 40 mEq (has no administration in time range)  ondansetron  (ZOFRAN -ODT) disintegrating tablet 8 mg (8 mg Oral Given 10/10/24 1215)  acetaminophen  (TYLENOL ) tablet 1,000 mg (1,000 mg Oral Given 10/10/24 1210)  ibuprofen  (ADVIL ) tablet 400 mg (400 mg Oral Given 10/10/24 1210)                                    Medical Decision Making Problems Addressed: Hypokalemia: acute illness or injury Substance use disorder: acute illness or injury with systemic symptoms that poses a threat to life or bodily functions    Details: Acute and chronic Withdrawal from other psychoactive substance (HCC): acute illness or  injury with systemic symptoms that poses a threat to life  or bodily functions  Amount and/or Complexity of Data Reviewed Independent Historian:     Details: Friend, hx External Data Reviewed: notes. Labs: ordered. Decision-making details documented in ED Course.  Risk OTC drugs. Prescription drug management. Decision regarding hospitalization.   Labs ordered/sent.   Differential diagnosis includes substance use disorder/withdrawal, dehydration, aki, etc. Dispo decision including potential need for admission considered - will get labs and reassess.   Reviewed nursing notes and prior charts for additional history. External reports reviewed. Additional history from: friend.   Labs reviewed/interpreted by me - wbc normal. Hct 33. Chem w k sl low, kcl po, other chem largely unremarkable.   Med for symptom relief. Po fluids/food.  Tolerating po. Recheck pt comfortable no distress. Discussed with patient, currently no indication for need of inpatient detox, and will provide [facility] and additional resources for close outpatient follow up/sud treatment programs.   Pt currently appears stable for ED d/c.   Rec close pcp/bh f/u.  Return precautions provided.       Final diagnoses:  None    ED Discharge Orders     None          Bernard Drivers, MD 10/10/24 1439  "
